# Patient Record
Sex: Female | Born: 1935 | Race: White | Hispanic: No | Smoking: Former smoker
Health system: Southern US, Community
[De-identification: ages and names within clinical notes are randomized; demographics above are authoritative.]

## PROBLEM LIST (undated history)

## (undated) DIAGNOSIS — I2510 Atherosclerotic heart disease of native coronary artery without angina pectoris: Secondary | ICD-10-CM

## (undated) DIAGNOSIS — G902 Horner's syndrome: Secondary | ICD-10-CM

## (undated) DIAGNOSIS — I509 Heart failure, unspecified: Secondary | ICD-10-CM

## (undated) DIAGNOSIS — R296 Repeated falls: Secondary | ICD-10-CM

## (undated) DIAGNOSIS — I6381 Other cerebral infarction due to occlusion or stenosis of small artery: Secondary | ICD-10-CM

## (undated) DIAGNOSIS — I471 Supraventricular tachycardia, unspecified: Secondary | ICD-10-CM

## (undated) DIAGNOSIS — I10 Essential (primary) hypertension: Secondary | ICD-10-CM

## (undated) DIAGNOSIS — F0390 Unspecified dementia without behavioral disturbance: Secondary | ICD-10-CM

## (undated) DIAGNOSIS — R4701 Aphasia: Secondary | ICD-10-CM

## (undated) DIAGNOSIS — E039 Hypothyroidism, unspecified: Secondary | ICD-10-CM

## (undated) HISTORY — DX: Hypothyroidism, unspecified: E03.9

## (undated) HISTORY — DX: Aphasia: R47.01

## (undated) HISTORY — DX: Supraventricular tachycardia: I47.1

## (undated) HISTORY — DX: Heart failure, unspecified: I50.9

## (undated) HISTORY — DX: Repeated falls: R29.6

## (undated) HISTORY — DX: Horner's syndrome: G90.2

## (undated) HISTORY — DX: Other cerebral infarction due to occlusion or stenosis of small artery: I63.81

## (undated) HISTORY — DX: Essential (primary) hypertension: I10

## (undated) HISTORY — DX: Supraventricular tachycardia, unspecified: I47.10

## (undated) HISTORY — DX: Atherosclerotic heart disease of native coronary artery without angina pectoris: I25.10

---

## 1999-07-04 ENCOUNTER — Other Ambulatory Visit: Admission: RE | Admit: 1999-07-04 | Discharge: 1999-07-04 | Payer: Self-pay | Admitting: Internal Medicine

## 2003-06-03 ENCOUNTER — Emergency Department (HOSPITAL_COMMUNITY): Admission: EM | Admit: 2003-06-03 | Discharge: 2003-06-03 | Payer: Self-pay | Admitting: Emergency Medicine

## 2004-01-04 ENCOUNTER — Ambulatory Visit: Payer: Self-pay | Admitting: Internal Medicine

## 2004-01-10 ENCOUNTER — Ambulatory Visit: Payer: Self-pay | Admitting: Internal Medicine

## 2004-03-06 ENCOUNTER — Ambulatory Visit: Payer: Self-pay | Admitting: Internal Medicine

## 2004-04-04 ENCOUNTER — Ambulatory Visit: Payer: Self-pay | Admitting: Internal Medicine

## 2004-07-04 ENCOUNTER — Ambulatory Visit: Payer: Self-pay | Admitting: Internal Medicine

## 2004-10-03 ENCOUNTER — Ambulatory Visit: Payer: Self-pay | Admitting: Internal Medicine

## 2004-12-13 ENCOUNTER — Ambulatory Visit: Payer: Self-pay | Admitting: Internal Medicine

## 2005-02-02 ENCOUNTER — Ambulatory Visit: Payer: Self-pay | Admitting: Internal Medicine

## 2005-03-16 ENCOUNTER — Inpatient Hospital Stay (HOSPITAL_COMMUNITY): Admission: AD | Admit: 2005-03-16 | Discharge: 2005-03-16 | Payer: Self-pay | Admitting: Obstetrics & Gynecology

## 2005-04-04 ENCOUNTER — Emergency Department (HOSPITAL_COMMUNITY): Admission: EM | Admit: 2005-04-04 | Discharge: 2005-04-04 | Payer: Self-pay | Admitting: Family Medicine

## 2005-06-06 ENCOUNTER — Ambulatory Visit: Payer: Self-pay | Admitting: Internal Medicine

## 2005-10-09 ENCOUNTER — Ambulatory Visit: Payer: Self-pay | Admitting: Internal Medicine

## 2005-12-13 ENCOUNTER — Ambulatory Visit: Payer: Self-pay | Admitting: Internal Medicine

## 2006-01-08 ENCOUNTER — Ambulatory Visit: Payer: Self-pay | Admitting: Internal Medicine

## 2006-03-05 ENCOUNTER — Ambulatory Visit: Payer: Self-pay | Admitting: Internal Medicine

## 2006-03-05 LAB — CONVERTED CEMR LAB
ALT: 19 units/L (ref 0–40)
Albumin: 3.4 g/dL — ABNORMAL LOW (ref 3.5–5.2)
CO2: 30 meq/L (ref 19–32)
Calcium: 9.9 mg/dL (ref 8.4–10.5)
Cholesterol: 181 mg/dL (ref 0–200)
Creatinine, Ser: 1.4 mg/dL — ABNORMAL HIGH (ref 0.4–1.2)
GFR calc non Af Amer: 38 mL/min
Glomerular Filtration Rate, Af Am: 46 mL/min/{1.73_m2}
HDL: 54.4 mg/dL (ref >39.0)
Sodium: 139 meq/L (ref 135–145)

## 2006-03-12 ENCOUNTER — Ambulatory Visit: Payer: Self-pay | Admitting: Internal Medicine

## 2006-05-08 ENCOUNTER — Ambulatory Visit: Payer: Self-pay | Admitting: Internal Medicine

## 2006-06-18 ENCOUNTER — Ambulatory Visit: Payer: Self-pay | Admitting: Internal Medicine

## 2006-06-18 LAB — CONVERTED CEMR LAB
Calcium: 9.4 mg/dL (ref 8.4–10.5)
Chloride: 96 meq/L (ref 96–112)
Creatinine, Ser: 1.2 mg/dL (ref 0.4–1.2)
Glucose, Bld: 107 mg/dL — ABNORMAL HIGH (ref 70–99)

## 2006-07-25 DIAGNOSIS — M810 Age-related osteoporosis without current pathological fracture: Secondary | ICD-10-CM | POA: Insufficient documentation

## 2006-07-25 DIAGNOSIS — Z8679 Personal history of other diseases of the circulatory system: Secondary | ICD-10-CM | POA: Insufficient documentation

## 2006-07-25 DIAGNOSIS — I10 Essential (primary) hypertension: Secondary | ICD-10-CM | POA: Insufficient documentation

## 2006-07-29 ENCOUNTER — Ambulatory Visit: Payer: Self-pay | Admitting: Internal Medicine

## 2006-08-28 ENCOUNTER — Ambulatory Visit: Payer: Self-pay | Admitting: Internal Medicine

## 2006-09-24 ENCOUNTER — Encounter: Admission: RE | Admit: 2006-09-24 | Discharge: 2006-09-24 | Payer: Self-pay | Admitting: Internal Medicine

## 2006-09-30 ENCOUNTER — Encounter: Admission: RE | Admit: 2006-09-30 | Discharge: 2006-09-30 | Payer: Self-pay | Admitting: Internal Medicine

## 2006-12-30 ENCOUNTER — Ambulatory Visit: Payer: Self-pay | Admitting: Internal Medicine

## 2007-03-18 ENCOUNTER — Telehealth: Payer: Self-pay | Admitting: *Deleted

## 2007-04-02 ENCOUNTER — Ambulatory Visit: Payer: Self-pay | Admitting: Internal Medicine

## 2007-04-08 ENCOUNTER — Encounter: Admission: RE | Admit: 2007-04-08 | Discharge: 2007-04-08 | Payer: Self-pay | Admitting: Internal Medicine

## 2007-06-04 ENCOUNTER — Ambulatory Visit: Payer: Self-pay | Admitting: Internal Medicine

## 2007-06-04 DIAGNOSIS — T887XXA Unspecified adverse effect of drug or medicament, initial encounter: Secondary | ICD-10-CM | POA: Insufficient documentation

## 2007-06-04 LAB — CONVERTED CEMR LAB
AST: 25 units/L (ref 0–37)
Albumin: 3.3 g/dL — ABNORMAL LOW (ref 3.5–5.2)
Alkaline Phosphatase: 53 units/L (ref 39–117)
BUN: 24 mg/dL — ABNORMAL HIGH (ref 6–23)
Basophils Relative: 0.5 % (ref 0.0–1.0)
Bilirubin, Direct: 0.2 mg/dL (ref 0.0–0.3)
CO2: 30 meq/L (ref 19–32)
Chloride: 106 meq/L (ref 96–112)
Creatinine, Ser: 1.4 mg/dL — ABNORMAL HIGH (ref 0.4–1.2)
Eosinophils Absolute: 0.3 10*3/uL (ref 0.0–0.7)
Eosinophils Relative: 3 % (ref 0.0–5.0)
Glucose, Bld: 113 mg/dL — ABNORMAL HIGH (ref 70–99)
HCT: 35.7 % — ABNORMAL LOW (ref 36.0–46.0)
Lymphocytes Relative: 20.5 % (ref 12.0–46.0)
Monocytes Absolute: 1.1 10*3/uL — ABNORMAL HIGH (ref 0.1–1.0)
Monocytes Relative: 11.1 % (ref 3.0–12.0)
Neutrophils Relative %: 64.9 % (ref 43.0–77.0)
RBC: 3.63 M/uL — ABNORMAL LOW (ref 3.87–5.11)
RDW: 14 % (ref 11.5–14.6)

## 2007-06-05 ENCOUNTER — Encounter: Payer: Self-pay | Admitting: Internal Medicine

## 2007-09-02 ENCOUNTER — Ambulatory Visit: Payer: Self-pay | Admitting: Internal Medicine

## 2007-09-02 DIAGNOSIS — K5909 Other constipation: Secondary | ICD-10-CM | POA: Insufficient documentation

## 2007-09-02 DIAGNOSIS — N6009 Solitary cyst of unspecified breast: Secondary | ICD-10-CM | POA: Insufficient documentation

## 2007-10-03 ENCOUNTER — Encounter: Admission: RE | Admit: 2007-10-03 | Discharge: 2007-10-03 | Payer: Self-pay | Admitting: Internal Medicine

## 2007-11-03 ENCOUNTER — Inpatient Hospital Stay (HOSPITAL_COMMUNITY): Admission: EM | Admit: 2007-11-03 | Discharge: 2007-11-05 | Payer: Self-pay | Admitting: Emergency Medicine

## 2007-11-03 ENCOUNTER — Ambulatory Visit: Payer: Self-pay | Admitting: Cardiology

## 2007-11-03 ENCOUNTER — Ambulatory Visit: Payer: Self-pay | Admitting: Internal Medicine

## 2007-11-04 ENCOUNTER — Ambulatory Visit: Payer: Self-pay | Admitting: Surgery

## 2007-11-04 ENCOUNTER — Encounter: Payer: Self-pay | Admitting: Internal Medicine

## 2007-12-05 ENCOUNTER — Ambulatory Visit: Payer: Self-pay | Admitting: Internal Medicine

## 2007-12-05 DIAGNOSIS — E039 Hypothyroidism, unspecified: Secondary | ICD-10-CM

## 2007-12-05 LAB — CONVERTED CEMR LAB: TSH: 0.06 microintl units/mL — ABNORMAL LOW (ref 0.35–5.50)

## 2008-03-05 ENCOUNTER — Ambulatory Visit: Payer: Self-pay | Admitting: Internal Medicine

## 2008-04-06 ENCOUNTER — Encounter: Admission: RE | Admit: 2008-04-06 | Discharge: 2008-04-06 | Payer: Self-pay | Admitting: Internal Medicine

## 2008-04-27 ENCOUNTER — Encounter: Payer: Self-pay | Admitting: Cardiology

## 2008-04-27 ENCOUNTER — Ambulatory Visit: Payer: Self-pay | Admitting: Cardiology

## 2008-04-27 ENCOUNTER — Inpatient Hospital Stay (HOSPITAL_COMMUNITY): Admission: EM | Admit: 2008-04-27 | Discharge: 2008-05-01 | Payer: Self-pay | Admitting: Emergency Medicine

## 2008-04-28 ENCOUNTER — Encounter (INDEPENDENT_AMBULATORY_CARE_PROVIDER_SITE_OTHER): Payer: Self-pay | Admitting: Emergency Medicine

## 2008-04-28 ENCOUNTER — Ambulatory Visit: Payer: Self-pay | Admitting: Surgery

## 2008-05-04 ENCOUNTER — Ambulatory Visit: Payer: Self-pay | Admitting: Cardiology

## 2008-05-12 ENCOUNTER — Ambulatory Visit: Payer: Self-pay | Admitting: Internal Medicine

## 2008-05-12 DIAGNOSIS — I2510 Atherosclerotic heart disease of native coronary artery without angina pectoris: Secondary | ICD-10-CM

## 2008-05-20 ENCOUNTER — Telehealth: Payer: Self-pay | Admitting: Internal Medicine

## 2008-05-26 ENCOUNTER — Encounter: Payer: Self-pay | Admitting: Cardiology

## 2008-05-26 ENCOUNTER — Ambulatory Visit: Payer: Self-pay | Admitting: Cardiology

## 2008-06-07 ENCOUNTER — Telehealth: Payer: Self-pay | Admitting: Cardiology

## 2008-06-09 ENCOUNTER — Ambulatory Visit: Payer: Self-pay | Admitting: Internal Medicine

## 2008-06-09 DIAGNOSIS — I872 Venous insufficiency (chronic) (peripheral): Secondary | ICD-10-CM | POA: Insufficient documentation

## 2008-06-22 ENCOUNTER — Encounter: Payer: Self-pay | Admitting: Cardiology

## 2008-06-22 ENCOUNTER — Ambulatory Visit: Payer: Self-pay

## 2008-06-25 ENCOUNTER — Telehealth: Payer: Self-pay | Admitting: Cardiology

## 2008-06-25 ENCOUNTER — Inpatient Hospital Stay (HOSPITAL_COMMUNITY): Admission: EM | Admit: 2008-06-25 | Discharge: 2008-06-30 | Payer: Self-pay | Admitting: Emergency Medicine

## 2008-07-02 ENCOUNTER — Telehealth: Payer: Self-pay | Admitting: Internal Medicine

## 2008-07-05 ENCOUNTER — Encounter: Payer: Self-pay | Admitting: Internal Medicine

## 2008-07-14 ENCOUNTER — Telehealth: Payer: Self-pay | Admitting: Cardiology

## 2008-07-14 ENCOUNTER — Telehealth: Payer: Self-pay | Admitting: Internal Medicine

## 2008-08-03 ENCOUNTER — Encounter: Payer: Self-pay | Admitting: Internal Medicine

## 2008-08-11 ENCOUNTER — Ambulatory Visit: Payer: Self-pay | Admitting: Internal Medicine

## 2008-08-11 DIAGNOSIS — L500 Allergic urticaria: Secondary | ICD-10-CM

## 2008-08-20 ENCOUNTER — Telehealth (INDEPENDENT_AMBULATORY_CARE_PROVIDER_SITE_OTHER): Payer: Self-pay | Admitting: *Deleted

## 2008-08-25 ENCOUNTER — Telehealth: Payer: Self-pay | Admitting: *Deleted

## 2008-08-26 ENCOUNTER — Encounter: Payer: Self-pay | Admitting: Internal Medicine

## 2008-09-17 ENCOUNTER — Ambulatory Visit: Payer: Self-pay | Admitting: Cardiology

## 2008-09-17 DIAGNOSIS — I5032 Chronic diastolic (congestive) heart failure: Secondary | ICD-10-CM

## 2008-09-24 ENCOUNTER — Encounter: Payer: Self-pay | Admitting: Internal Medicine

## 2008-09-28 ENCOUNTER — Ambulatory Visit: Payer: Self-pay | Admitting: Cardiology

## 2008-09-28 ENCOUNTER — Telehealth (INDEPENDENT_AMBULATORY_CARE_PROVIDER_SITE_OTHER): Payer: Self-pay | Admitting: *Deleted

## 2008-09-28 LAB — CONVERTED CEMR LAB
BUN: 27 mg/dL — ABNORMAL HIGH (ref 6–23)
CO2: 30 meq/L (ref 19–32)
Creatinine, Ser: 1.3 mg/dL — ABNORMAL HIGH (ref 0.4–1.2)
GFR calc non Af Amer: 41.26 mL/min (ref >60)

## 2008-10-04 ENCOUNTER — Encounter: Admission: RE | Admit: 2008-10-04 | Discharge: 2008-10-04 | Payer: Self-pay | Admitting: Internal Medicine

## 2008-10-13 ENCOUNTER — Ambulatory Visit: Payer: Self-pay | Admitting: Internal Medicine

## 2008-11-05 ENCOUNTER — Ambulatory Visit: Payer: Self-pay | Admitting: Cardiology

## 2008-11-05 DIAGNOSIS — E785 Hyperlipidemia, unspecified: Secondary | ICD-10-CM | POA: Insufficient documentation

## 2008-11-08 ENCOUNTER — Telehealth: Payer: Self-pay | Admitting: Cardiology

## 2008-11-08 ENCOUNTER — Ambulatory Visit: Payer: Self-pay | Admitting: Cardiology

## 2008-11-16 LAB — CONVERTED CEMR LAB
Albumin: 3.7 g/dL (ref 3.5–5.2)
Alkaline Phosphatase: 70 units/L (ref 39–117)
HDL: 74.4 mg/dL (ref >39.00)
LDL Cholesterol: 43 mg/dL (ref 0–99)
Total CHOL/HDL Ratio: 2

## 2008-11-23 ENCOUNTER — Encounter: Payer: Self-pay | Admitting: Internal Medicine

## 2008-12-20 ENCOUNTER — Encounter: Payer: Self-pay | Admitting: Cardiology

## 2008-12-28 ENCOUNTER — Telehealth: Payer: Self-pay | Admitting: Cardiology

## 2009-01-17 ENCOUNTER — Ambulatory Visit: Payer: Self-pay | Admitting: Internal Medicine

## 2009-01-26 ENCOUNTER — Telehealth (INDEPENDENT_AMBULATORY_CARE_PROVIDER_SITE_OTHER): Payer: Self-pay | Admitting: *Deleted

## 2009-01-26 ENCOUNTER — Telehealth: Payer: Self-pay | Admitting: Cardiology

## 2009-01-27 ENCOUNTER — Telehealth: Payer: Self-pay | Admitting: Cardiology

## 2009-02-11 ENCOUNTER — Ambulatory Visit: Payer: Self-pay

## 2009-02-11 LAB — CONVERTED CEMR LAB
BUN: 24 mg/dL — ABNORMAL HIGH (ref 6–23)
CO2: 30 meq/L (ref 19–32)
Calcium: 9.5 mg/dL (ref 8.4–10.5)
Chloride: 103 meq/L (ref 96–112)
GFR calc non Af Amer: 37.85 mL/min (ref >60)
Sodium: 139 meq/L (ref 135–145)

## 2009-02-16 ENCOUNTER — Telehealth: Payer: Self-pay | Admitting: Cardiology

## 2009-03-02 ENCOUNTER — Telehealth (INDEPENDENT_AMBULATORY_CARE_PROVIDER_SITE_OTHER): Payer: Self-pay | Admitting: *Deleted

## 2009-03-22 ENCOUNTER — Ambulatory Visit: Payer: Self-pay | Admitting: Internal Medicine

## 2009-03-22 LAB — CONVERTED CEMR LAB
Cholesterol: 118 mg/dL (ref 0–200)
Free T4: 1.2 ng/dL (ref 0.6–1.6)
T3, Free: 2.5 pg/mL (ref 2.3–4.2)

## 2009-05-17 ENCOUNTER — Ambulatory Visit: Payer: Self-pay | Admitting: Cardiology

## 2009-06-08 ENCOUNTER — Ambulatory Visit: Payer: Self-pay | Admitting: Cardiovascular Disease

## 2009-06-08 ENCOUNTER — Encounter: Payer: Self-pay | Admitting: Cardiology

## 2009-06-08 ENCOUNTER — Ambulatory Visit: Payer: Self-pay

## 2009-06-08 ENCOUNTER — Ambulatory Visit (HOSPITAL_COMMUNITY): Admission: RE | Admit: 2009-06-08 | Discharge: 2009-06-08 | Payer: Self-pay | Admitting: Cardiovascular Disease

## 2009-07-12 ENCOUNTER — Ambulatory Visit: Payer: Self-pay | Admitting: Internal Medicine

## 2009-10-12 ENCOUNTER — Encounter: Admission: RE | Admit: 2009-10-12 | Discharge: 2009-10-12 | Payer: Self-pay | Admitting: Internal Medicine

## 2009-11-09 ENCOUNTER — Ambulatory Visit: Payer: Self-pay | Admitting: Cardiology

## 2009-11-15 ENCOUNTER — Telehealth (INDEPENDENT_AMBULATORY_CARE_PROVIDER_SITE_OTHER): Payer: Self-pay | Admitting: *Deleted

## 2009-11-23 ENCOUNTER — Telehealth (INDEPENDENT_AMBULATORY_CARE_PROVIDER_SITE_OTHER): Payer: Self-pay | Admitting: *Deleted

## 2009-11-24 ENCOUNTER — Telehealth (INDEPENDENT_AMBULATORY_CARE_PROVIDER_SITE_OTHER): Payer: Self-pay | Admitting: *Deleted

## 2009-11-28 ENCOUNTER — Encounter: Payer: Self-pay | Admitting: Internal Medicine

## 2010-01-10 ENCOUNTER — Ambulatory Visit: Payer: Self-pay | Admitting: Internal Medicine

## 2010-03-26 LAB — CONVERTED CEMR LAB
CO2: 29 meq/L (ref 19–32)
Creatinine, Ser: 1.3 mg/dL — ABNORMAL HIGH (ref 0.4–1.2)
Glucose, Bld: 89 mg/dL (ref 70–99)
Sodium: 142 meq/L (ref 135–145)

## 2010-03-30 NOTE — Progress Notes (Signed)
     Follow-up for Phone Call       Follow-up by: KM

## 2010-03-30 NOTE — Progress Notes (Signed)
  Pt's Son dropped off [facility] papers to be completed, also completed Packet for [facility] sent all to [facility]. [facility] [place]  November 15, 2009 8:56 AM

## 2010-03-30 NOTE — Assessment & Plan Note (Signed)
Summary: 6 month rov/sl   Primary [name]:  Kathryn Glaze MD   History of Present Illness: 77 yo with h/o CAD and TIAs vs atypical migraines presents for followup.  She is now living at home by herself though her son lives around the corner.  She is able to walk around her house and to the mailbox without exertional dyspnea.  She uses a recumbent bike in her house for exercise.  She has not had any chest pain.  Last echo showed preserved LV systolic function.  She has had no recent neurologic episodes (TIA vs atypical migraine).  No orthopnea.  Weight is down 4 lbs.  No lightheadedness with standing.    ECG: NSR, normal  Labs (8/10): K 4.2, creatinine 1.3 Labs (1/11): LDL 36, HDL 63, TSH normal Labs (9/11): K 4.6, creatinine 1.3  Current Medications (verified): 1)  Os-Cal 500 + D 500-200 Mg-Unit Tabs (Calcium Carbonate-Vitamin D) .... Once Daily 2)  Clonidine Hcl 0.1 Mg Tabs (Clonidine Hcl) .[name].. 1 By Mouth Three Times A Day 3)  Vitamin D 16109 Unit  Caps (Ergocalciferol) .... One By Mouth Weekly 4)  Lactulose 10 Gm/39ml  Soln (Lactulose) .[name].. 15cc By Mouth Daily For Bowels, May Increase To 30 Cc If Constipated 5)  Levothyroxine Sodium 25 Mcg Tabs (Levothyroxine Sodium) .... One By Mouth Daily 1/2 Hour Before Breakfast On Empty Stomach 6)  Ensure Nutra Shake Hi-Cal  Liqd (Nutritional Supplements) .... Once Daily 7)  Fluoridex Sensitivity Relief 1.1-5 % Pste (Sod Fluoride-Potassium Nitrate) .... Use  As Directed 8)  Plavix 75 Mg Tabs (Clopidogrel Bisulfate) .[name].. 1 Once Daily 9)  Ascriptin 325 Mg Tabs (Aspirin Buf(Alhyd-Mghyd-Cacar)) .[name].. 1 Once Daily 10)  Coreg 12.5 Mg Tabs (Carvedilol) .[name].. 1 Two Times A Day 11)  Simvastatin 40 Mg Tabs (Simvastatin) .[name].. 1 By Mouth Daily Before Bed 12)  Amlodipine Besylate 10 Mg Tabs (Amlodipine Besylate) .... Once Daily 13)  Nitrostat 0.4 Mg Subl (Nitroglycerin) .... As Needed 14)  Diovan 160 Mg Tabs (Valsartan) .... One Tablet Two Times A Day 15)   Keppra 250 Mg Tabs (Levetiracetam) .[name].. 1 Two Times A Day 16)  Lasix 20 Mg Tabs (Furosemide) .[name].. 1 Every Other Day  Allergies (verified): 1)  ! * Clonidine Patch Glue  Past History:  Past Medical History: 1.  HTN 2.  CAD:  NSTEMI 3/10.  LHC showed 80% mLAD, 90% mCFX.  She had PCI with Endeavor 2.5 x 12 to LAD and Endeavor 3.0 x 12 to CFX.   3.  Diastolic CHF:  Echo (3/10) EF 60-45% with periapical akinesis.  Mild LVH.  Mild MR.  Pseudonormal diastolic function.  Echo (4/10): EF 65% with mild focal basal septal hypertrophy.  Pseudonormal diastolic function.  Normal wall motion.  Moderate biatrial enlargement.  PASP 61 mmHg.  Echo (4/11): EF 55%, inferobasal hypokinesis, mild MR, PA systolic pressure 44 mmHg.  4.  Frequent falls 5.  Lacunar stroke on CT 6.  Expressive aphasia episodes:  More likely atypical migraine than TIA.  Carotid dopplers (8/'09) with 40-60% LICA stenosis.  7.  Left Horners syndrome.  8.  Transient SVT 9.  Hypothyroidism.   Family History: Reviewed history from 05/26/2008 and no changes required. mother passed away from  HTN and age father died from COPD  Social History: Reviewed history from 05/17/2009 and no changes required. 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  Lives alone but son close by.  Alcohol use-no Drug use-no Regular exercise-no  Review of Systems       All systems reviewed and negative except as per HPI.   Vital Signs:  Patient profile:   77 year old female Height:      59 inches Weight:      145 pounds BMI:     29.39 Pulse rate:   70 / minute Pulse rhythm:   regular BP sitting:   126 / 62  (left arm) Cuff size:   regular  Vitals Entered By: Judithe Modest CMA (November 09, 2009 3:14 PM)  Physical Exam  General:  Elderly female, no apparent distress.  Neck:  Neck supple, no JVD. No masses, thyromegaly or abnormal cervical nodes. Lungs:  Clear bilaterally to auscultation and percussion. Heart:   Non-displaced PMI, chest non-tender; regular rate and rhythm, S1, S2 without rubs or gallops. 2/6 early systolic ejection murmur RUSB.  Carotid upstroke normal, no bruit.  Trace ankle edema.  Abdomen:  Bowel sounds positive; abdomen soft and non-tender without masses, organomegaly, or hernias noted. No hepatosplenomegaly. Extremities:  No clubbing or cyanosis. Neurologic:  Alert and oriented x 3. Psych:  Normal affect.   Impression & Recommendations:  Problem # 1:  DIASTOLIC HEART FAILURE, CHRONIC (ICD-428.32) Euvolemic, minimal dyspnea.  Continue current Lasix regimen.  EF normal on last echo with mild pulmonary hypertension suggesting LV diastolic dysfunction.  Creatinine stable.   Problem # 2:  CORONARY ATHEROSCLEROSIS NATIVE CORONARY ARTERY (ICD-414.01) No ischemic symptoms.  She has been on Plavix for over 1.5 years after PCI.  Given her age and fall risk, I think that we should stop Plavix at this point.  She can decrease aspirin to 162 mg daily.  Continue statin, Coreg, ARB.   Problem # 3:  HYPERLIPIDEMIA-MIXED (ICD-272.4) Will be getting lipids soon with Dr. Lovell Sheehan.   Other Orders: TLB-BMP (Basic Metabolic Panel-BMET) (80048-METABOL)  Patient Instructions: 1)  Your physician has recommended you make the following change in your medication:  2)  Stop Plavix. 3)  Decrease Apsirin to 162mg  daily--this will be two 81mg  tablets daily--this should be buffered or coated. 4)  Lab today--BMP 414.01 428.32 5)  Your physician wants you to follow-up in: 6 months with Dr Shirlee Latch.  You will receive a reminder letter in the mail two months in advance. If you don't receive a letter, please call our office to schedule the follow-up appointment.

## 2010-03-30 NOTE — Letter (Signed)
Summary: [facility]  [facility]   Imported By: Maryln Gottron 12/05/2009 12:41:36  _____________________________________________________________________  External Attachment:    Type:   Image     Comment:   External Document

## 2010-03-30 NOTE — Assessment & Plan Note (Signed)
Summary: 6 MONTH ROV/SL   Visit Type:  Follow-up Primary Provider:  Stacie Glaze MD   History of Present Illness: 77 yo with h/o CAD and TIAs vs atypical migraines presents for followup.  She is now living at home by herself though her son lives around the corner.  She is able to walk around her house and to the mailbox without exertional dyspnea.  She uses a recumbent bike in her house for exercise.  She has not had any chest pain.  Last echo showed preserved LV systolic function.  She has had no recent neurologic episodes (TIA vs atypical migraine).  No orthopnea.  Weight is stable.   ECG: NSR, T wave inversion in V6  Labs (8/10): K 4.2, creatinine 1.3 Labs (1/11): LDL 36, HDL 63, TSH normal  Current Medications (verified): 1)  Os-Cal 500 + D 500-200 Mg-Unit Tabs (Calcium Carbonate-Vitamin D) .... Once Daily 2)  Clonidine Hcl 0.1 Mg Tabs (Clonidine Hcl) .[name].. 1 By Mouth Three Times A Day 3)  Vitamin D 16109 Unit  Caps (Ergocalciferol) .... One By Mouth Weekly 4)  Lactulose 10 Gm/35ml  Soln (Lactulose) .[name].. 15cc By Mouth Daily For Bowels, May Increase To 30 Cc If Constipated 5)  Levothyroxine Sodium 25 Mcg Tabs (Levothyroxine Sodium) .... One By Mouth Daily 1/2 Hour Before Breakfast On Empty Stomach 6)  Ensure Nutra Shake Hi-Cal  Liqd (Nutritional Supplements) .... Once Daily 7)  Fluoridex Sensitivity Relief 1.1-5 % Pste (Sod Fluoride-Potassium Nitrate) .... Use  As Directed 8)  Plavix 75 Mg Tabs (Clopidogrel Bisulfate) .[name].. 1 Once Daily 9)  Ascriptin 325 Mg Tabs (Aspirin Buf(Alhyd-Mghyd-Cacar)) .[name].. 1 Once Daily 10)  Coreg 12.5 Mg Tabs (Carvedilol) .[name].. 1 Two Times A Day 11)  Simvastatin 40 Mg Tabs (Simvastatin) .[name].. 1 By Mouth Daily Before Bed 12)  Amlodipine Besylate 10 Mg Tabs (Amlodipine Besylate) .... Once Daily 13)  Nitrostat 0.4 Mg Subl (Nitroglycerin) .... As Needed 14)  Diovan 160 Mg Tabs (Valsartan) .... One Tablet Two Times A Day 15)  Keppra 250 Mg Tabs (Levetiracetam)  .[name].. 1 Two Times A Day 16)  Lasix 20 Mg Tabs (Furosemide) .[name].. 1 Every Other Day  Allergies: 1)  ! * Clonidine Patch Glue  Past History:  Past Medical History: Reviewed history from 09/17/2008 and no changes required. 1.  HTN 2.  CAD:  NSTEMI 3/10.  LHC showed 80% mLAD, 90% mCFX.  She had PCI with Endeavor 2.5 x 12 to LAD and Endeavor 3.0 x 12 to CFX.   3.  Diastolic CHF:  Echo (3/10) EF 60-45% with periapical akinesis.  Mild LVH.  Mild MR.  Pseudonormal diastolic function.  Echo (4/10): EF 65% with mild focal basal septal hypertrophy.  Pseudonormal diastolic function.  Normal wall motion.  Moderate biatrial enlargement.  PASP 61 mmHg.  4.  Frequent falls 5.  Lacunar stroke on CT 6.  Expressive aphasia episodes:  More likely atypical migraine than TIA.  Carotid dopplers (8/'09) with 40-60% LICA stenosis.  7.  Left Horners syndrome.  8.  Transient SVT 9.  Hypothyroidism.   Family History: Reviewed history from 05/26/2008 and no changes required. 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  Lives alone but son close by.  Alcohol use-no Drug use-no Regular exercise-no  Review of Systems       All systems reviewed and negative except as per HPI.  Vital Signs:  Patient profile:   77 year old female Height:      59 inches Weight:      149 pounds Pulse rate:   66 / minute BP sitting:   122 / 74  (left arm)  Vitals Entered By: Laurance Flatten CMA (May 17, 2009 4:08 PM)  Physical Exam  General:  Elderly female, no apparent distress.  Neck:  Neck supple, no JVD. No masses, thyromegaly or abnormal cervical nodes. Lungs:  Clear bilaterally to auscultation and percussion. Heart:  Non-displaced PMI, chest non-tender; regular rate and rhythm, S1, S2 without rubs or gallops. 2/6 early systolic ejection murmur RUSB.  Carotid upstroke normal, no bruit.  Trace ankle edema.  Abdomen:  Bowel  sounds positive; abdomen soft and non-tender without masses, organomegaly, or hernias noted. No hepatosplenomegaly. Extremities:  No clubbing or cyanosis. Neurologic:  Alert and oriented x 3. Psych:  Normal affect.   Impression & Recommendations:  Problem # 1:  DIASTOLIC HEART FAILURE, CHRONIC (ICD-428.32) Most recent echo showed preserved LV systolic function (4/10).  Prior echo showed systolic dysfunction.  She is doing well symptomatically and appears euvolemic.  I will repeat echo in 4/11 to make sure EF remains preserved and to re-assess pulmonary artery pressure now that systemic BP is under good control and volume overload seems to be resolved.  She will continue Lasix 20 mg qod.    Problem # 2:  CORONARY ATHEROSCLEROSIS NATIVE CORONARY ARTERY (ICD-414.01) No ischemic symptoms. Continue ASA, Plavix, statin, beta blocker, ARB.   Problem # 3:  HYPERTENSION (ICD-401.9) BP is under good control with current regimen.   Other Orders: EKG w/ Interpretation (93000) Echocardiogram (Echo)  Patient Instructions: 1)  Your physician has requested that you have an echocardiogram.  Echocardiography is a painless test that uses sound waves to create images of your heart. It provides your doctor with information about the size and shape of your heart and how well your heart's chambers and valves are working.  This procedure takes approximately one hour. There are no restrictions for this procedure.  APRIL 2011 2)  Your physician wants you to follow-up in: 6 months with Dr Shirlee Latch.   You will receive a reminder letter in the mail two months in advance. If you don't receive a letter, please call our office to schedule the follow-up appointment.

## 2010-03-30 NOTE — Progress Notes (Signed)
Summary: [facility]  [facility]   Imported By: Roderic Ovens 03/02/2009 11:28:16  _____________________________________________________________________  External Attachment:    Type:   Image     Comment:   External Document

## 2010-03-30 NOTE — Assessment & Plan Note (Signed)
Summary: 6 month rov/njr   Vital Signs:  Patient profile:   77 year old female Height:      59 inches Weight:      144 pounds BMI:     29.19 Temp:     98.2 degrees F oral Pulse rate:   72 / minute Resp:     14 per minute BP sitting:   140 / 80  (left arm)  Vitals Entered By: Willy Eddy, LPN (January 10, 2010 4:15 PM) CC: roa, Hypertension Management Is Patient Diabetic? No   Primary Care [facility]:  Stacie Glaze MD  CC:  roa and Hypertension Management.  History of Present Illness: The pt has not been able to get suppliment that has the calcium and the bone minerals. Has not been walking....  Has not been able to use the cycle due to moving the pessary dicussed 1000 steps a day   Hypertension History:      She denies headache, chest pain, palpitations, dyspnea with exertion, orthopnea, PND, peripheral edema, visual symptoms, neurologic problems, syncope, and side effects from treatment.        Positive major cardiovascular risk factors include female age 43 years old or older, hyperlipidemia, and hypertension.  Negative major cardiovascular risk factors include non-tobacco-user status.        Positive history for target organ damage include ASHD (either angina/prior MI/prior CABG) and cardiac end organ damage (either CHF or LVH).     Preventive Screening-Counseling & Management  Alcohol-Tobacco     Smoking Status: never     Year Quit: 1954     Passive Smoke Exposure: no     Tobacco Counseling: not indicated; no tobacco use  Problems Prior to Update: 1)  Hyperlipidemia-mixed  (ICD-272.4) 2)  Diastolic Heart Failure, Chronic  (ICD-428.32) 3)  Coronary Atherosclerosis Native Coronary Artery  (ICD-414.01) 4)  Ischemic Cardiomyopathy  (ICD-414.8) 5)  Allergic Urticaria  (ICD-708.0) 6)  Unspecified Venous Insufficiency  (ICD-459.81) 7)  Unspecified Hypothyroidism  (ICD-244.9) 8)  Breast Cyst  (ICD-610.0) 9)  Breast Cyst  (ICD-610.0) 10)  Constipation, Drug  Induced  (ICD-564.09) 11)  Uns Advrs Eff Uns Rx Medicinal&biological Sbstnc  (ICD-995.20) 12)  Cough  (ICD-786.2) 13)  Cerebrovascular Accident, Hx of  (ICD-V12.50) 14)  Osteoporosis  (ICD-733.00) 15)  Hypertension  (ICD-401.9)  Current Problems (verified): 1)  Hyperlipidemia-mixed  (ICD-272.4) 2)  Diastolic Heart Failure, Chronic  (ICD-428.32) 3)  Coronary Atherosclerosis Native Coronary Artery  (ICD-414.01) 4)  Ischemic Cardiomyopathy  (ICD-414.8) 5)  Allergic Urticaria  (ICD-708.0) 6)  Unspecified Venous Insufficiency  (ICD-459.81) 7)  Unspecified Hypothyroidism  (ICD-244.9) 8)  Breast Cyst  (ICD-610.0) 9)  Breast Cyst  (ICD-610.0) 10)  Constipation, Drug Induced  (ICD-564.09) 11)  Uns Advrs Eff Uns Rx Medicinal&biological Sbstnc  (ICD-995.20) 12)  Cough  (ICD-786.2) 13)  Cerebrovascular Accident, Hx of  (ICD-V12.50) 14)  Osteoporosis  (ICD-733.00) 15)  Hypertension  (ICD-401.9)  Medications Prior to Update: 1)  Os-Cal 500 + D 500-200 Mg-Unit Tabs (Calcium Carbonate-Vitamin D) .... Once Daily 2)  Clonidine Hcl 0.1 Mg Tabs (Clonidine Hcl) .[name].. 1 By Mouth Three Times A Day 3)  Vitamin D 30865 Unit  Caps (Ergocalciferol) .... One By Mouth Weekly 4)  Lactulose 10 Gm/80ml  Soln (Lactulose) .[name].. 15cc By Mouth Daily For Bowels, May Increase To 30 Cc If Constipated 5)  Levothyroxine Sodium 25 Mcg Tabs (Levothyroxine Sodium) .... One By Mouth Daily 1/2 Hour Before Breakfast On Empty Stomach 6)  Ensure Nutra Shake  Hi-Cal  Liqd (Nutritional Supplements) .... Once Daily 7)  Fluoridex Sensitivity Relief 1.1-5 % Pste (Sod Fluoride-Potassium Nitrate) .... Use  As Directed 8)  Aspirin 81 Mg Tbec (Aspirin) .... Two Tablets Daily 9)  Coreg 12.5 Mg Tabs (Carvedilol) .[name].. 1 Two Times A Day 10)  Simvastatin 40 Mg Tabs (Simvastatin) .[name].. 1 By Mouth Daily Before Bed 11)  Amlodipine Besylate 10 Mg Tabs (Amlodipine Besylate) .... Once Daily 12)  Nitrostat 0.4 Mg Subl (Nitroglycerin) .... As Needed 13)   Diovan 160 Mg Tabs (Valsartan) .... One Tablet Two Times A Day 14)  Keppra 250 Mg Tabs (Levetiracetam) .[name].. 1 Two Times A Day 15)  Lasix 20 Mg Tabs (Furosemide) .[name].. 1 Every Other Day  Current Medications (verified): 1)  Os-Cal 500 + D 500-200 Mg-Unit Tabs (Calcium Carbonate-Vitamin D) .... Once Daily 2)  Clonidine Hcl 0.1 Mg Tabs (Clonidine Hcl) .[name].. 1 By Mouth Three Times A Day 3)  Vitamin D 04540 Unit  Caps (Ergocalciferol) .... One By Mouth Weekly 4)  Lactulose 10 Gm/2ml  Soln (Lactulose) .[name].. 15cc By Mouth Daily For Bowels, May Increase To 30 Cc If Constipated 5)  Levothyroxine Sodium 25 Mcg Tabs (Levothyroxine Sodium) .... One By Mouth Daily 1/2 Hour Before Breakfast On Empty Stomach 6)  Fluoridex Sensitivity Relief 1.1-5 % Pste (Sod Fluoride-Potassium Nitrate) .... Use  As Directed 7)  Aspirin 81 Mg Tbec (Aspirin) .... Two Tablets Daily 8)  Coreg 12.5 Mg Tabs (Carvedilol) .[name].. 1 Two Times A Day 9)  Simvastatin 40 Mg Tabs (Simvastatin) .[name].. 1 By Mouth Daily Before Bed 10)  Amlodipine Besylate 10 Mg Tabs (Amlodipine Besylate) .... Once Daily 11)  Nitrostat 0.4 Mg Subl (Nitroglycerin) .... As Needed 12)  Diovan 160 Mg Tabs (Valsartan) .... One Tablet Two Times A Day 13)  Keppra 250 Mg Tabs (Levetiracetam) .[name].. 1 Two Times A Day 14)  Lasix 20 Mg Tabs (Furosemide) .[name].. 1 Every Other Day  Allergies (verified): 1)  ! * Clonidine Patch Glue  Past History:  Family History: Last updated: 06/25/2008 mother passed away from  HTN and age father died from COPD  Social History: Last updated: 05/17/2009 Never Smoked Retired Son Rick involved in her care.  Widowed.  Lives alone but son close by.  Alcohol use-no Drug use-no Regular exercise-no  Risk Factors: Exercise: no (04/02/2007)  Risk Factors: Smoking Status: never (01/10/2010) Passive Smoke Exposure: no (01/10/2010)  Past medical, surgical, family and social histories (including risk factors) reviewed, and no changes noted  (except as noted below).  Past Medical History: Reviewed history from 11/09/2009 and no changes required. 1.  HTN 2.  CAD:  NSTEMI 3/10.  LHC showed 80% mLAD, 90% mCFX.  She had PCI with Endeavor 2.5 x 12 to LAD and Endeavor 3.0 x 12 to CFX.   3.  Diastolic CHF:  Echo (3/10) EF 98-11% with periapical akinesis.  Mild LVH.  Mild MR.  Pseudonormal diastolic function.  Echo (4/10): EF 65% with mild focal basal septal hypertrophy.  Pseudonormal diastolic function.  Normal wall motion.  Moderate biatrial enlargement.  PASP 61 mmHg.  Echo (4/11): EF 55%, inferobasal hypokinesis, mild MR, PA systolic pressure 44 mmHg.  4.  Frequent falls 5.  Lacunar stroke on CT 6.  Expressive aphasia episodes:  More likely atypical migraine than TIA.  Carotid dopplers (8/'09) with 40-60% LICA stenosis.  7.  Left Horners syndrome.  8.  Transient SVT 9.  Hypothyroidism.   Past Surgical History: Reviewed history from 04/02/2007 and no  changes required. no surgeries Denies surgical history  Family History: Reviewed history from 05/26/2008 and no changes required. mother passed away from  HTN and age father died from COPD  Social History: Reviewed history from 05/17/2009 and no changes required. 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  Lives alone but son close by.  Alcohol use-no Drug use-no Regular exercise-no  Review of Systems       The patient complains of syncope, decreased hearing, and peripheral edema.  The patient denies weight loss, chest pain, dyspnea on exertion, headaches, abdominal pain, melena, severe indigestion/heartburn, depression, unusual weight change, abnormal bleeding, and breast masses.         Flu Vaccine Consent Questions     Do you have a history of severe allergic reactions to this vaccine? no    Any prior history of allergic reactions to egg and/or gelatin? no    Do you have a sensitivity to the preservative Thimersol? no    Do you have a past history  of Guillan-Barre Syndrome? no    Do you currently have an acute febrile illness? no    Have you ever had a severe reaction to latex? no    Vaccine information given and explained to patient? yes    Are you currently pregnant? no    Lot Number:[id number]   Exp Date:08/26/2010   Site Given  Left Deltoid IM   Physical Exam  General:  Elderly female, no apparent distress.  Head:  normocephalic and atraumatic Eyes:  pupils equal and pupils round.  pupils reactive to light.   Ears:  R ear normal and L ear normal.   Nose:  no deformity, discharge, inflammation, or lesions Neck:  Neck supple, JVP 8-9 cm. No masses, thyromegaly or abnormal cervical nodes. Lungs:  Clear bilaterally to auscultation and percussion. Heart:  Non-displaced PMI, chest non-tender; regular rate and rhythm, S1, S2 without rubs or gallops. 2/6 early systolic ejection murmur RUSB.  Carotid upstroke normal, no bruit.  1+ edema L>R to 1/4 up lower legs bilaterally.  Abdomen:  Bowel sounds positive,abdomen soft and non-tender without masses, organomegaly or hernias noted. Msk:  joint tenderness, joint swelling, and joint warmth.     Impression & Recommendations:  Problem # 1:  DIASTOLIC HEART FAILURE, CHRONIC (ICD-428.32)  Her updated medication list for this problem includes:    Aspirin 81 Mg Tbec (Aspirin) .[name]..[name] Two tablets daily    Coreg 12.5 Mg Tabs (Carvedilol) .[name]... 1 two times a day    Diovan 160 Mg Tabs (Valsartan) ..... One tablet two times a day    Lasix 20 Mg Tabs (Furosemide) .[name]... 1 every other day  Echocardiogram: Study Conclusions    1. Left ventricle: There was mild focal basal hypertrophy of the      septum. The estimated ejection fraction was 65%. Wall motion was      normal; there were no regional wall motion abnormalities.      Features are consistent with a pseudonormal left ventricular      filling pattern, with concomitant abnormal relaxation and      increased filling pressure (grade 2  diastolic dysfunction).      Doppler parameters are consistent with elevated ventricular      end-diastolic filling pressure.   2. Left atrium: The atrium was moderately dilated.   3. Right atrium: The atrium was moderately dilated.   4. Atrial septum: There was increased thickness of the septum,      consistent  with lipomatous hypertrophy.   5. Pulmonary arteries: Systolic pressure was moderately to severely      increased. PA peak pressure: 61mm Hg (S).  (06/22/2008)  Problem # 2:  HYPERTENSION (ICD-401.9)  Her updated medication list for this problem includes:    Clonidine Hcl 0.1 Mg Tabs (Clonidine hcl) .[name]... 1 by mouth three times a day    Coreg 12.5 Mg Tabs (Carvedilol) .[name]... 1 two times a day    Amlodipine Besylate 10 Mg Tabs (Amlodipine besylate) ..... Once daily    Diovan 160 Mg Tabs (Valsartan) ..... One tablet two times a day    Lasix 20 Mg Tabs (Furosemide) .[name]... 1 every other day  BP today: 140/80 Prior BP: 126/62 (11/09/2009)  Prior 10 Yr Risk Heart Disease: N/A (06/09/2008)  Labs Reviewed: K+: 4.6 (11/09/2009) Creat: : 1.3 (11/09/2009)   Chol: 118 (03/22/2009)   HDL: 63.50 (03/22/2009)   LDL: 43 (11/08/2008)   TG: 94.0 (11/08/2008)  Problem # 3:  CONSTIPATION, DRUG INDUCED (ICD-564.09) stable Her updated medication list for this problem includes:    Lactulose 10 Gm/83ml Soln (Lactulose) .Marland KitchenMarland KitchenMarland KitchenMarland Kitchen 15cc by mouth daily for bowels, may increase to 30 cc if constipated  Discussed dietary fiber measures and increased water intake.   Complete Medication List: 1)  Os-cal 500 + D 500-200 Mg-unit Tabs (Calcium carbonate-vitamin d) .... Once daily 2)  Clonidine Hcl 0.1 Mg Tabs (Clonidine hcl) .[name].. 1 by mouth three times a day 3)  Vitamin D 81191 Unit Caps (Ergocalciferol) .... One by mouth weekly 4)  Lactulose 10 Gm/71ml Soln (Lactulose) .[name].. 15cc by mouth daily for bowels, may increase to 30 cc if constipated 5)  Levothyroxine Sodium 25 Mcg Tabs (Levothyroxine sodium) ....  One by mouth daily 1/2 hour before breakfast on empty stomach 6)  Fluoridex Sensitivity Relief 1.1-5 % Pste (Sod fluoride-potassium nitrate) .... Use  as directed 7)  Aspirin 81 Mg Tbec (Aspirin) .... Two tablets daily 8)  Coreg 12.5 Mg Tabs (Carvedilol) .[name].. 1 two times a day 9)  Simvastatin 40 Mg Tabs (Simvastatin) .[name].. 1 by mouth daily before bed 10)  Amlodipine Besylate 10 Mg Tabs (Amlodipine besylate) .... Once daily 11)  Nitrostat 0.4 Mg Subl (Nitroglycerin) .... As needed 12)  Diovan 160 Mg Tabs (Valsartan) .... One tablet two times a day 13)  Keppra 250 Mg Tabs (Levetiracetam) .[name].. 1 two times a day 14)  Lasix 20 Mg Tabs (Furosemide) .[name].. 1 every other day  Other Orders: Flu Vaccine 53yrs + MEDICARE PATIENTS ([id number]) Administration Flu vaccine - MCR ([id number])  Hypertension Assessment/Plan:      The patient's hypertensive risk group is category C: Target organ damage and/or diabetes.  Today's blood pressure is 140/80.  Her blood pressure goal is < 140/90.  Patient Instructions: 1)  Please schedule a follow-up appointment in 4 months.   Orders Added: 1)  Flu Vaccine 49yrs + MEDICARE PATIENTS [Q2039] 2)  Administration Flu vaccine - MCR [G0008] 3)  Est. Patient Level IV [08657]

## 2010-03-30 NOTE — Assessment & Plan Note (Signed)
Summary: 2 month rov/njr rsc bmp/njr   Vital Signs:  Patient profile:   77 year old female Height:      59 inches Weight:      148 pounds Temp:     98.2 degrees F oral Pulse rate:   72 / minute Resp:     14 per minute BP sitting:   146 / 80  (left arm)  Vitals Entered By: Willy Eddy, LPN (March 22, 2009 3:48 PM) CC: roa-htn, Hypertension Management   Primary Care Provider:  Stacie Glaze MD  CC:  roa-htn and Hypertension Management.  History of Present Illness:  Hypertension Follow-Up      This is an 77 year old woman who presents for Hypertension follow-up.  The patient denies lightheadedness, urinary frequency, headaches, edema, impotence, rash, and fatigue.  The patient denies the following associated symptoms: chest pain, chest pressure, exercise intolerance, dyspnea, palpitations, syncope, leg edema, and pedal edema.  Compliance with medications (by patient report) has been near 100%.  The patient reports that dietary compliance has been excellent.  The patient reports exercising daily.  Adjunctive measures currently used by the patient include salt restriction.    Hypertension History:      She denies headache, chest pain, palpitations, dyspnea with exertion, orthopnea, PND, peripheral edema, visual symptoms, neurologic problems, syncope, and side effects from treatment.        Positive major cardiovascular risk factors include female age 61 years old or older, hyperlipidemia, and hypertension.  Negative major cardiovascular risk factors include non-tobacco-user status.        Positive history for target organ damage include ASHD (either angina/prior MI/prior CABG) and cardiac end organ damage (either CHF or LVH).   htn   Preventive Screening-Counseling & Management  Alcohol-Tobacco     Smoking Status: never     Year Quit: 1954     Passive Smoke Exposure: no  Problems Prior to Update: 1)  Hyperlipidemia-mixed  (ICD-272.4) 2)  Diastolic Heart Failure, Chronic   (ICD-428.32) 3)  Coronary Atherosclerosis Native Coronary Artery  (ICD-414.01) 4)  Ischemic Cardiomyopathy  (ICD-414.8) 5)  Allergic Urticaria  (ICD-708.0) 6)  Unspecified Venous Insufficiency  (ICD-459.81) 7)  Unspecified Hypothyroidism  (ICD-244.9) 8)  Breast Cyst  (ICD-610.0) 9)  Breast Cyst  (ICD-610.0) 10)  Constipation, Drug Induced  (ICD-564.09) 11)  Uns Advrs Eff Uns Rx Medicinal&biological Sbstnc  (ICD-995.20) 12)  Cough  (ICD-786.2) 13)  Cerebrovascular Accident, Hx of  (ICD-V12.50) 14)  Osteoporosis  (ICD-733.00) 15)  Hypertension  (ICD-401.9)  Medications Prior to Update: 1)  Os-Cal 500 + D 500-200 Mg-Unit Tabs (Calcium Carbonate-Vitamin D) .... Once Daily 2)  Clonidine Hcl 0.1 Mg Tabs (Clonidine Hcl) .[name].. 1 By Mouth Three Times A Day 3)  Vitamin D 96295 Unit  Caps (Ergocalciferol) .... One By Mouth Weekly 4)  Lactulose 10 Gm/33ml  Soln (Lactulose) .[name].. 15cc By Mouth Daily For Bowels, May Increase To 30 Cc If Constipated 5)  Levothyroxine Sodium 25 Mcg Tabs (Levothyroxine Sodium) .... One By Mouth Daily 1/2 Hour Before Breakfast On Empty Stomach 6)  Ensure Nutra Shake Hi-Cal  Liqd (Nutritional Supplements) .... Once Daily 7)  Fluoridex Sensitivity Relief 1.1-5 % Pste (Sod Fluoride-Potassium Nitrate) .... Use  As Directed 8)  Plavix 75 Mg Tabs (Clopidogrel Bisulfate) .[name].. 1 Once Daily 9)  Ascriptin 325 Mg Tabs (Aspirin Buf(Alhyd-Mghyd-Cacar)) .[name].. 1 Once Daily 10)  Coreg 12.5 Mg Tabs (Carvedilol) .[name].. 1 Two Times A Day 11)  Simvastatin 40 Mg Tabs (Simvastatin) .Marland KitchenMarland KitchenMarland Kitchen  1 By Mouth Daily Before Bed 12)  Amlodipine Besylate 10 Mg Tabs (Amlodipine Besylate) .... Once Daily 13)  Nitrostat 0.4 Mg Subl (Nitroglycerin) .... As Needed 14)  Diovan 160 Mg Tabs (Valsartan) .... One Tablet Two Times A Day 15)  Keppra 250 Mg Tabs (Levetiracetam) .[name].. 1 Two Times A Day 16)  Lasix 20 Mg Tabs (Furosemide) .[name].. 1 Every Other Day  Current Medications (verified): 1)  Os-Cal 500 + D 500-200  Mg-Unit Tabs (Calcium Carbonate-Vitamin D) .... Once Daily 2)  Clonidine Hcl 0.1 Mg Tabs (Clonidine Hcl) .[name].. 1 By Mouth Three Times A Day 3)  Vitamin D 04540 Unit  Caps (Ergocalciferol) .... One By Mouth Weekly 4)  Lactulose 10 Gm/41ml  Soln (Lactulose) .[name].. 15cc By Mouth Daily For Bowels, May Increase To 30 Cc If Constipated 5)  Levothyroxine Sodium 25 Mcg Tabs (Levothyroxine Sodium) .... One By Mouth Daily 1/2 Hour Before Breakfast On Empty Stomach 6)  Ensure Nutra Shake Hi-Cal  Liqd (Nutritional Supplements) .... Once Daily 7)  Fluoridex Sensitivity Relief 1.1-5 % Pste (Sod Fluoride-Potassium Nitrate) .... Use  As Directed 8)  Plavix 75 Mg Tabs (Clopidogrel Bisulfate) .[name].. 1 Once Daily 9)  Ascriptin 325 Mg Tabs (Aspirin Buf(Alhyd-Mghyd-Cacar)) .[name].. 1 Once Daily 10)  Coreg 12.5 Mg Tabs (Carvedilol) .[name].. 1 Two Times A Day 11)  Simvastatin 40 Mg Tabs (Simvastatin) .[name].. 1 By Mouth Daily Before Bed 12)  Amlodipine Besylate 10 Mg Tabs (Amlodipine Besylate) .... Once Daily 13)  Nitrostat 0.4 Mg Subl (Nitroglycerin) .... As Needed 14)  Diovan 160 Mg Tabs (Valsartan) .... One Tablet Two Times A Day 15)  Keppra 250 Mg Tabs (Levetiracetam) .[name].. 1 Two Times A Day 16)  Lasix 20 Mg Tabs (Furosemide) .[name].. 1 Every Other Day  Allergies (verified): 1)  ! * Clonidine Patch Glue  Past History:  Family History: Last updated: 06/01/2008 mother passed away from  HTN and age father died from COPD  Social History: Last updated: 09/17/2008 Never Smoked Retired Widowed.  Lives at assisted living [facility]).  Alcohol use-no Drug use-no Regular exercise-no  Risk Factors: Exercise: no (04/02/2007)  Risk Factors: Smoking Status: never (03/22/2009) Passive Smoke Exposure: no (03/22/2009)  Past medical, surgical, family and social histories (including risk factors) reviewed, and no changes noted (except as noted below).  Past Medical History: Reviewed history from 09/17/2008 and no changes  required. 1.  HTN 2.  CAD:  NSTEMI 3/10.  LHC showed 80% mLAD, 90% mCFX.  She had PCI with Endeavor 2.5 x 12 to LAD and Endeavor 3.0 x 12 to CFX.   3.  Diastolic CHF:  Echo (3/10) EF 98-11% with periapical akinesis.  Mild LVH.  Mild MR.  Pseudonormal diastolic function.  Echo (4/10): EF 65% with mild focal basal septal hypertrophy.  Pseudonormal diastolic function.  Normal wall motion.  Moderate biatrial enlargement.  PASP 61 mmHg.  4.  Frequent falls 5.  Lacunar stroke on CT 6.  Expressive aphasia episodes:  More likely atypical migraine than TIA.  Carotid dopplers (8/'09) with 40-60% LICA stenosis.  7.  Left Horners syndrome.  8.  Transient SVT 9.  Hypothyroidism.   Past Surgical History: Reviewed history from 04/02/2007 and no changes required. no surgeries Denies surgical history  Family History: Reviewed history from June 01, 2008 and no changes required. mother passed away from  HTN and age father died from COPD  Social History: Reviewed history from 09/17/2008 and no changes required. Never Smoked Retired Secretary/administrator.  Lives at assisted living ([facility]).  Alcohol use-no Drug use-no Regular exercise-no  Review of Systems       The patient complains of peripheral edema.  The patient denies anorexia, fever, weight loss, weight gain, vision loss, decreased hearing, hoarseness, chest pain, syncope, dyspnea on exertion, prolonged cough, headaches, hemoptysis, abdominal pain, melena, hematochezia, severe indigestion/heartburn, hematuria, incontinence, genital sores, muscle weakness, suspicious skin lesions, transient blindness, difficulty walking, depression, unusual weight change, abnormal bleeding, enlarged lymph nodes, angioedema, and breast masses.    Physical Exam  General:  Elderly female, no apparent distress.  Head:  normocephalic and atraumatic Eyes:  pupils equal and pupils round.  pupils reactive to light.   Ears:  R ear normal and L ear normal.   Nose:  no  deformity, discharge, inflammation, or lesions Mouth:  Teeth, gums and palate normal. Oral mucosa normal. Neck:  Neck supple, JVP 8-9 cm. No masses, thyromegaly or abnormal cervical nodes. Lungs:  Clear bilaterally to auscultation and percussion. Heart:  Non-displaced PMI, chest non-tender; regular rate and rhythm, S1, S2 without rubs or gallops. 2/6 early systolic ejection murmur RUSB.  Carotid upstroke normal, no bruit.  1+ edema L>R to 1/4 up lower legs bilaterally.  Abdomen:  Bowel sounds positive,abdomen soft and non-tender without masses, organomegaly or hernias noted. Msk:  joint tenderness, joint swelling, and joint warmth.   Pulses:  R and L carotid,radial,femoral,dorsalis pedis and posterior tibial pulses are full and equal bilaterally Extremities:  1+ left pedal edema and trace right pedal edema.   Neurologic:  alert & oriented X3 and finger-to-nose normal.     Impression & Recommendations:  Problem # 1:  DIASTOLIC HEART FAILURE, CHRONIC (ICD-428.32) Assessment Unchanged  Her updated medication list for this problem includes:    Plavix 75 Mg Tabs (Clopidogrel bisulfate) .[name]... 1 once daily    Ascriptin 325 Mg Tabs (Aspirin buf(alhyd-mghyd-cacar)) .[name]... 1 once daily    Coreg 12.5 Mg Tabs (Carvedilol) .[name]... 1 two times a day    Diovan 160 Mg Tabs (Valsartan) ..... One tablet two times a day    Lasix 20 Mg Tabs (Furosemide) .[name]... 1 every other day  Echocardiogram: Study Conclusions    1. Left ventricle: There was mild focal basal hypertrophy of the      septum. The estimated ejection fraction was 65%. Wall motion was      normal; there were no regional wall motion abnormalities.      Features are consistent with a pseudonormal left ventricular      filling pattern, with concomitant abnormal relaxation and      increased filling pressure (grade 2 diastolic dysfunction).      Doppler parameters are consistent with elevated ventricular      end-diastolic filling pressure.   2.  Left atrium: The atrium was moderately dilated.   3. Right atrium: The atrium was moderately dilated.   4. Atrial septum: There was increased thickness of the septum,      consistent with lipomatous hypertrophy.   5. Pulmonary arteries: Systolic pressure was moderately to severely      increased. PA peak pressure: 61mm Hg (S).  (06/22/2008)  Problem # 2:  HYPERTENSION (ICD-401.9) Assessment: Unchanged  Her updated medication list for this problem includes:    Clonidine Hcl 0.1 Mg Tabs (Clonidine hcl) .[name]... 1 by mouth three times a day    Coreg 12.5 Mg Tabs (Carvedilol) .[name]... 1 two times a day    Amlodipine Besylate 10 Mg Tabs (Amlodipine besylate) ..... Once daily  Diovan 160 Mg Tabs (Valsartan) ..... One tablet two times a day    Lasix 20 Mg Tabs (Furosemide) .[name]... 1 every other day  BP today: 146/80 Prior BP: 126/62 (02/11/2009)  Prior 10 Yr Risk Heart Disease: N/A (06/09/2008)  Labs Reviewed: K+: 4.1 (02/11/2009) Creat: : 1.4 (02/11/2009)   Chol: 136 (11/08/2008)   HDL: 74.40 (11/08/2008)   LDL: 43 (11/08/2008)   TG: 94.0 (11/08/2008)  Problem # 3:  UNSPECIFIED HYPOTHYROIDISM (ICD-244.9)  Her updated medication list for this problem includes:    Levothyroxine Sodium 25 Mcg Tabs (Levothyroxine sodium) ..... One by mouth daily 1/2 hour before breakfast on empty stomach  Labs Reviewed: TSH: 0.06 (12/05/2007)    Chol: 136 (11/08/2008)   HDL: 74.40 (11/08/2008)   LDL: 43 (11/08/2008)   TG: 94.0 (11/08/2008)  Orders: Venipuncture (11914) TLB-TSH (Thyroid Stimulating Hormone) (84443-TSH) TLB-T4 (Thyrox), Free [phone]) TLB-T3, Free (Triiodothyronine) (84481-T3FREE)  Complete Medication List: 1)  Os-cal 500 + D 500-200 Mg-unit Tabs (Calcium carbonate-vitamin d) .... Once daily 2)  Clonidine Hcl 0.1 Mg Tabs (Clonidine hcl) .[name].. 1 by mouth three times a day 3)  Vitamin D 08657 Unit Caps (Ergocalciferol) .... One by mouth weekly 4)  Lactulose 10 Gm/57ml Soln (Lactulose)  .[name].. 15cc by mouth daily for bowels, may increase to 30 cc if constipated 5)  Levothyroxine Sodium 25 Mcg Tabs (Levothyroxine sodium) .... One by mouth daily 1/2 hour before breakfast on empty stomach 6)  Ensure Nutra Shake Hi-cal Liqd (Nutritional supplements) .... Once daily 7)  Fluoridex Sensitivity Relief 1.1-5 % Pste (Sod fluoride-potassium nitrate) .... Use  as directed 8)  Plavix 75 Mg Tabs (Clopidogrel bisulfate) .[name].. 1 once daily 9)  Ascriptin 325 Mg Tabs (Aspirin buf(alhyd-mghyd-cacar)) .[name].. 1 once daily 10)  Coreg 12.5 Mg Tabs (Carvedilol) .[name].. 1 two times a day 11)  Simvastatin 40 Mg Tabs (Simvastatin) .[name].. 1 by mouth daily before bed 12)  Amlodipine Besylate 10 Mg Tabs (Amlodipine besylate) .... Once daily 13)  Nitrostat 0.4 Mg Subl (Nitroglycerin) .... As needed 14)  Diovan 160 Mg Tabs (Valsartan) .... One tablet two times a day 15)  Keppra 250 Mg Tabs (Levetiracetam) .[name].. 1 two times a day 16)  Lasix 20 Mg Tabs (Furosemide) .[name].. 1 every other day  Other Orders: TLB-Cholesterol, HDL (83718-HDL) TLB-Cholesterol, Direct LDL (83721-DIRLDL) TLB-Cholesterol, Total (82465-CHO)  Hypertension Assessment/Plan:      The patient's hypertensive risk group is category C: Target organ damage and/or diabetes.  Today's blood pressure is 146/80.  Her blood pressure goal is < 140/90.  Patient Instructions: 1)  Please schedule a follow-up appointment in 4 months.

## 2010-03-30 NOTE — Progress Notes (Signed)
  Walk in Patient Form Recieved " Needs FMLA completed" Needs by 1/14 forwarded to [employer] for processing. [name]  March 02, 2009 2:29 PM

## 2010-03-30 NOTE — Progress Notes (Signed)
  Phone Note Call from Patient   Caller: Son Details for Reason: FMLA Summary of Call: Need to Get FMLA completed Initial call taken by: KM  Follow-up for Phone Call       Follow-up by: KM    Son picked up FMLA today from [facility]  Son calling needs FMLA signed and completed by Dr.McLean needs to be turned in by Friday. Will p/u Tomorrow call when ready

## 2010-03-30 NOTE — Progress Notes (Signed)
Summary: B/P readings  Phone Note Outgoing Call   Call placed by: Katina Dung, RN, BSN,  September 28, 2008 3:01 PM Call placed to: Patient Summary of Call: B/P readings  Follow-up for Phone Call        recent B/P readings 09/18/08 147/69 09/22/08 136/70 09/24/08 145/69 09/26/08 139/64 will forward to Dr Shirlee Latch for review and follow-up with patient after review by Dr Shirlee Latch pt had BMP draw today      Appended Document: B/P readings This is probably ok for her.   Appended Document: B/P readings LMVM [phone]  Appended Document: B/P readings talked with son

## 2010-03-30 NOTE — Assessment & Plan Note (Signed)
Summary: 4 month fup//ccm   Vital Signs:  Patient profile:   77 year old female Height:      59 inches Weight:      147 pounds BMI:     29.80 Temp:     98.2 degrees F oral Pulse rate:   68 / minute Resp:     14 per minute BP sitting:   132 / 78  (left arm)  Vitals Entered By: Willy Eddy, LPN (Jul 12, 2009 4:38 PM)  Primary Care Provider:  Stacie Glaze MD   History of Present Illness: The pt had an echo showing preserved ejection fraction The pt has been taking ensure for nutritional support The pt is on a full aspirin and had questions about bruises no increased edema   Hypertension History:      She denies headache, chest pain, palpitations, dyspnea with exertion, orthopnea, PND, peripheral edema, visual symptoms, neurologic problems, syncope, and side effects from treatment.        Positive major cardiovascular risk factors include female age 35 years old or older, hyperlipidemia, and hypertension.  Negative major cardiovascular risk factors include non-tobacco-user status.        Positive history for target organ damage include ASHD (either angina/prior MI/prior CABG) and cardiac end organ damage (either CHF or LVH).     Problems Prior to Update: 1)  Hyperlipidemia-mixed  (ICD-272.4) 2)  Diastolic Heart Failure, Chronic  (ICD-428.32) 3)  Coronary Atherosclerosis Native Coronary Artery  (ICD-414.01) 4)  Ischemic Cardiomyopathy  (ICD-414.8) 5)  Allergic Urticaria  (ICD-708.0) 6)  Unspecified Venous Insufficiency  (ICD-459.81) 7)  Unspecified Hypothyroidism  (ICD-244.9) 8)  Breast Cyst  (ICD-610.0) 9)  Breast Cyst  (ICD-610.0) 10)  Constipation, Drug Induced  (ICD-564.09) 11)  Uns Advrs Eff Uns Rx Medicinal&biological Sbstnc  (ICD-995.20) 12)  Cough  (ICD-786.2) 13)  Cerebrovascular Accident, Hx of  (ICD-V12.50) 14)  Osteoporosis  (ICD-733.00) 15)  Hypertension  (ICD-401.9)  Medications Prior to Update: 1)  Os-Cal 500 + D 500-200 Mg-Unit Tabs (Calcium  Carbonate-Vitamin D) .... Once Daily 2)  Clonidine Hcl 0.1 Mg Tabs (Clonidine Hcl) .[name].. 1 By Mouth Three Times A Day 3)  Vitamin D 27253 Unit  Caps (Ergocalciferol) .... One By Mouth Weekly 4)  Lactulose 10 Gm/51ml  Soln (Lactulose) .[name].. 15cc By Mouth Daily For Bowels, May Increase To 30 Cc If Constipated 5)  Levothyroxine Sodium 25 Mcg Tabs (Levothyroxine Sodium) .... One By Mouth Daily 1/2 Hour Before Breakfast On Empty Stomach 6)  Ensure Nutra Shake Hi-Cal  Liqd (Nutritional Supplements) .... Once Daily 7)  Fluoridex Sensitivity Relief 1.1-5 % Pste (Sod Fluoride-Potassium Nitrate) .... Use  As Directed 8)  Plavix 75 Mg Tabs (Clopidogrel Bisulfate) .[name].. 1 Once Daily 9)  Ascriptin 325 Mg Tabs (Aspirin Buf(Alhyd-Mghyd-Cacar)) .[name].. 1 Once Daily 10)  Coreg 12.5 Mg Tabs (Carvedilol) .[name].. 1 Two Times A Day 11)  Simvastatin 40 Mg Tabs (Simvastatin) .[name].. 1 By Mouth Daily Before Bed 12)  Amlodipine Besylate 10 Mg Tabs (Amlodipine Besylate) .... Once Daily 13)  Nitrostat 0.4 Mg Subl (Nitroglycerin) .... As Needed 14)  Diovan 160 Mg Tabs (Valsartan) .... One Tablet Two Times A Day 15)  Keppra 250 Mg Tabs (Levetiracetam) .[name].. 1 Two Times A Day 16)  Lasix 20 Mg Tabs (Furosemide) .[name].. 1 Every Other Day  Current Medications (verified): 1)  Os-Cal 500 + D 500-200 Mg-Unit Tabs (Calcium Carbonate-Vitamin D) .... Once Daily 2)  Clonidine Hcl 0.1 Mg Tabs (Clonidine Hcl) .Marland KitchenMarland KitchenMarland Kitchen 1  By Mouth Three Times A Day 3)  Vitamin D 16109 Unit  Caps (Ergocalciferol) .... One By Mouth Weekly 4)  Lactulose 10 Gm/74ml  Soln (Lactulose) .[name].. 15cc By Mouth Daily For Bowels, May Increase To 30 Cc If Constipated 5)  Levothyroxine Sodium 25 Mcg Tabs (Levothyroxine Sodium) .... One By Mouth Daily 1/2 Hour Before Breakfast On Empty Stomach 6)  Ensure Nutra Shake Hi-Cal  Liqd (Nutritional Supplements) .... Once Daily 7)  Fluoridex Sensitivity Relief 1.1-5 % Pste (Sod Fluoride-Potassium Nitrate) .... Use  As Directed 8)  Plavix 75 Mg  Tabs (Clopidogrel Bisulfate) .[name].. 1 Once Daily 9)  Ascriptin 325 Mg Tabs (Aspirin Buf(Alhyd-Mghyd-Cacar)) .[name].. 1 Once Daily 10)  Coreg 12.5 Mg Tabs (Carvedilol) .[name].. 1 Two Times A Day 11)  Simvastatin 40 Mg Tabs (Simvastatin) .[name].. 1 By Mouth Daily Before Bed 12)  Amlodipine Besylate 10 Mg Tabs (Amlodipine Besylate) .... Once Daily 13)  Nitrostat 0.4 Mg Subl (Nitroglycerin) .... As Needed 14)  Diovan 160 Mg Tabs (Valsartan) .... One Tablet Two Times A Day 15)  Keppra 250 Mg Tabs (Levetiracetam) .[name].. 1 Two Times A Day 16)  Lasix 20 Mg Tabs (Furosemide) .[name].. 1 Every Other Day  Allergies (verified): 1)  ! * Clonidine Patch Glue  Past History:  Family History: Last updated: 06/04/08 mother passed away from  HTN and age father died from COPD  Social History: Last updated: 05/17/2009 Never Smoked Retired Son Rick involved in her care.  Widowed.  Lives alone but son close by.  Alcohol use-no Drug use-no Regular exercise-no  Risk Factors: Exercise: no (04/02/2007)  Risk Factors: Smoking Status: never (03/22/2009) Passive Smoke Exposure: no (03/22/2009)  Past medical, surgical, family and social histories (including risk factors) reviewed, and no changes noted (except as noted below).  Past Medical History: Reviewed history from 09/17/2008 and no changes required. 1.  HTN 2.  CAD:  NSTEMI 3/10.  LHC showed 80% mLAD, 90% mCFX.  She had PCI with Endeavor 2.5 x 12 to LAD and Endeavor 3.0 x 12 to CFX.   3.  Diastolic CHF:  Echo (3/10) EF 60-45% with periapical akinesis.  Mild LVH.  Mild MR.  Pseudonormal diastolic function.  Echo (4/10): EF 65% with mild focal basal septal hypertrophy.  Pseudonormal diastolic function.  Normal wall motion.  Moderate biatrial enlargement.  PASP 61 mmHg.  4.  Frequent falls 5.  Lacunar stroke on CT 6.  Expressive aphasia episodes:  More likely atypical migraine than TIA.  Carotid dopplers (8/'09) with 40-60% LICA stenosis.  7.  Left Horners syndrome.   8.  Transient SVT 9.  Hypothyroidism.   Past Surgical History: Reviewed history from 04/02/2007 and no changes required. no surgeries Denies surgical history  Family History: Reviewed history from 06/04/08 and no changes required. mother passed away from  HTN and age father died from COPD  Social History: Reviewed history from 05/17/2009 and no changes required. 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  Lives alone but son close by.  Alcohol use-no Drug use-no Regular exercise-no  Review of Systems  The patient denies anorexia, fever, weight loss, weight gain, vision loss, decreased hearing, hoarseness, chest pain, syncope, dyspnea on exertion, peripheral edema, prolonged cough, headaches, hemoptysis, abdominal pain, melena, hematochezia, severe indigestion/heartburn, hematuria, incontinence, genital sores, muscle weakness, suspicious skin lesions, transient blindness, difficulty walking, depression, unusual weight change, abnormal bleeding, enlarged lymph nodes, angioedema, and breast masses.    Physical Exam  General:  Elderly female, no apparent  distress.  Head:  normocephalic and atraumatic Eyes:  pupils equal and pupils round.  pupils reactive to light.   Ears:  R ear normal and L ear normal.   Nose:  no deformity, discharge, inflammation, or lesions Mouth:  Teeth, gums and palate normal. Oral mucosa normal. Neck:  Neck supple, JVP 8-9 cm. No masses, thyromegaly or abnormal cervical nodes. Lungs:  Clear bilaterally to auscultation and percussion. Heart:  Non-displaced PMI, chest non-tender; regular rate and rhythm, S1, S2 without rubs or gallops. 2/6 early systolic ejection murmur RUSB.  Carotid upstroke normal, no bruit.  1+ edema L>R to 1/4 up lower legs bilaterally.  Abdomen:  Bowel sounds positive,abdomen soft and non-tender without masses, organomegaly or hernias noted. Msk:  joint tenderness, joint swelling, and joint warmth.   Extremities:   1+ left pedal edema and trace right pedal edema.   Neurologic:  alert & oriented X3 and finger-to-nose normal.     Impression & Recommendations:  Problem # 1:  DIASTOLIC HEART FAILURE, CHRONIC (ICD-428.32)  Her updated medication list for this problem includes:    Plavix 75 Mg Tabs (Clopidogrel bisulfate) .[name]... 1 once daily    Ascriptin 325 Mg Tabs (Aspirin buf(alhyd-mghyd-cacar)) .[name]... 1 once daily    Coreg 12.5 Mg Tabs (Carvedilol) .[name]... 1 two times a day    Diovan 160 Mg Tabs (Valsartan) ..... One tablet two times a day    Lasix 20 Mg Tabs (Furosemide) .[name]... 1 every other day  Echocardiogram: Study Conclusions    1. Left ventricle: There was mild focal basal hypertrophy of the      septum. The estimated ejection fraction was 65%. Wall motion was      normal; there were no regional wall motion abnormalities.      Features are consistent with a pseudonormal left ventricular      filling pattern, with concomitant abnormal relaxation and      increased filling pressure (grade 2 diastolic dysfunction).      Doppler parameters are consistent with elevated ventricular      end-diastolic filling pressure.   2. Left atrium: The atrium was moderately dilated.   3. Right atrium: The atrium was moderately dilated.   4. Atrial septum: There was increased thickness of the septum,      consistent with lipomatous hypertrophy.   5. Pulmonary arteries: Systolic pressure was moderately to severely      increased. PA peak pressure: 61mm Hg (S).  (06/22/2008)  Problem # 2:  CORONARY ATHEROSCLEROSIS NATIVE CORONARY ARTERY (ICD-414.01) stay on aspirin Her updated medication list for this problem includes:    Clonidine Hcl 0.1 Mg Tabs (Clonidine hcl) .[name]... 1 by mouth three times a day    Plavix 75 Mg Tabs (Clopidogrel bisulfate) .[name]... 1 once daily    Ascriptin 325 Mg Tabs (Aspirin buf(alhyd-mghyd-cacar)) .[name]... 1 once daily    Coreg 12.5 Mg Tabs (Carvedilol) .[name]... 1 two times a day    Amlodipine  Besylate 10 Mg Tabs (Amlodipine besylate) ..... Once daily    Nitrostat 0.4 Mg Subl (Nitroglycerin) .[name]... As needed    Diovan 160 Mg Tabs (Valsartan) ..... One tablet two times a day    Lasix 20 Mg Tabs (Furosemide) .[name]... 1 every other day  Problem # 3:  HYPERTENSION (ICD-401.9)  Her updated medication list for this problem includes:    Clonidine Hcl 0.1 Mg Tabs (Clonidine hcl) .[name]... 1 by mouth three times a day    Coreg 12.5 Mg Tabs (Carvedilol) .[name]... 1 two  times a day    Amlodipine Besylate 10 Mg Tabs (Amlodipine besylate) ..... Once daily    Diovan 160 Mg Tabs (Valsartan) ..... One tablet two times a day    Lasix 20 Mg Tabs (Furosemide) .[name]... 1 every other day  BP today: 132/78 Prior BP: 122/74 (05/17/2009)  Prior 10 Yr Risk Heart Disease: N/A (06/09/2008)  Labs Reviewed: K+: 4.1 (02/11/2009) Creat: : 1.4 (02/11/2009)   Chol: 118 (03/22/2009)   HDL: 63.50 (03/22/2009)   LDL: 43 (11/08/2008)   TG: 94.0 (11/08/2008)  Complete Medication List: 1)  Os-cal 500 + D 500-200 Mg-unit Tabs (Calcium carbonate-vitamin d) .... Once daily 2)  Clonidine Hcl 0.1 Mg Tabs (Clonidine hcl) .[name].. 1 by mouth three times a day 3)  Vitamin D 81191 Unit Caps (Ergocalciferol) .... One by mouth weekly 4)  Lactulose 10 Gm/27ml Soln (Lactulose) .[name].. 15cc by mouth daily for bowels, may increase to 30 cc if constipated 5)  Levothyroxine Sodium 25 Mcg Tabs (Levothyroxine sodium) .... One by mouth daily 1/2 hour before breakfast on empty stomach 6)  Ensure Nutra Shake Hi-cal Liqd (Nutritional supplements) .... Once daily 7)  Fluoridex Sensitivity Relief 1.1-5 % Pste (Sod fluoride-potassium nitrate) .... Use  as directed 8)  Plavix 75 Mg Tabs (Clopidogrel bisulfate) .[name].. 1 once daily 9)  Ascriptin 325 Mg Tabs (Aspirin buf(alhyd-mghyd-cacar)) .[name].. 1 once daily 10)  Coreg 12.5 Mg Tabs (Carvedilol) .[name].. 1 two times a day 11)  Simvastatin 40 Mg Tabs (Simvastatin) .[name].. 1 by mouth daily before bed 12)  Amlodipine  Besylate 10 Mg Tabs (Amlodipine besylate) .... Once daily 13)  Nitrostat 0.4 Mg Subl (Nitroglycerin) .... As needed 14)  Diovan 160 Mg Tabs (Valsartan) .... One tablet two times a day 15)  Keppra 250 Mg Tabs (Levetiracetam) .[name].. 1 two times a day 16)  Lasix 20 Mg Tabs (Furosemide) .[name].. 1 every other day  Hypertension Assessment/Plan:      The patient's hypertensive risk group is category C: Target organ damage and/or diabetes.  Today's blood pressure is 132/78.  Her blood pressure goal is < 140/90.  Patient Instructions: 1)  Please schedule a follow-up appointment in 6  months.

## 2010-04-13 ENCOUNTER — Other Ambulatory Visit: Payer: Self-pay | Admitting: Internal Medicine

## 2010-05-06 ENCOUNTER — Encounter: Payer: Self-pay | Admitting: Cardiology

## 2010-05-10 ENCOUNTER — Encounter: Payer: Self-pay | Admitting: Internal Medicine

## 2010-05-10 ENCOUNTER — Ambulatory Visit: Payer: Self-pay | Admitting: Internal Medicine

## 2010-05-10 ENCOUNTER — Ambulatory Visit (INDEPENDENT_AMBULATORY_CARE_PROVIDER_SITE_OTHER): Payer: Medicare Other | Admitting: Internal Medicine

## 2010-05-10 VITALS — BP 140/78 | HR 69 | Temp 98.2°F | Resp 14 | Ht <= 58 in | Wt 148.0 lb

## 2010-05-10 DIAGNOSIS — I10 Essential (primary) hypertension: Secondary | ICD-10-CM

## 2010-05-10 DIAGNOSIS — E039 Hypothyroidism, unspecified: Secondary | ICD-10-CM

## 2010-05-10 DIAGNOSIS — E785 Hyperlipidemia, unspecified: Secondary | ICD-10-CM

## 2010-05-10 DIAGNOSIS — F329 Major depressive disorder, single episode, unspecified: Secondary | ICD-10-CM

## 2010-05-10 DIAGNOSIS — F32A Depression, unspecified: Secondary | ICD-10-CM | POA: Insufficient documentation

## 2010-05-10 DIAGNOSIS — F3289 Other specified depressive episodes: Secondary | ICD-10-CM

## 2010-05-10 MED ORDER — SERTRALINE HCL 25 MG PO TABS: 25.0000 mg | ORAL_TABLET | Freq: Every day | ORAL | Status: DC

## 2010-05-10 NOTE — Progress Notes (Signed)
Subjective:    Patient ID: [name], female    DOB: Jul 12, 1920, 77 y.o.   MRN: [id number]  HPI   this is an 77 year old white female who presents for followup of hypertension hypothyroidism and hyperlipidemia she is also followed by cardiology and neurology 4 ischemic cardiomyopathy diastolic heart failure and for a history of a seizure disorder.   we discussed her condition with her son states that she is becoming increasingly argumentative and angry their relationship has been strained she signs of vegetative depression she does want to go out of her house she has several excuses for why she can't leave her house.  I have known this woman for some time and did do see signs of depression and anger and hostility that I have never seen before.    Review of Systems  Constitutional: Positive for activity change and fatigue.  HENT: Positive for sneezing and postnasal drip.   Eyes: Negative.   Respiratory: Negative.   Cardiovascular: Negative.   Gastrointestinal: Negative.   Genitourinary: Negative.   Skin: Negative.   Psychiatric/Behavioral: Positive for behavioral problems, sleep disturbance and agitation. Negative for suicidal ideas and self-injury. The patient is nervous/anxious.    Past Medical History  Diagnosis Date  . Hypertension   . Coronary artery disease     NSTEMI 3/10. LHC showed 80% mLAD, 90% mCFX. She had PCI with Endeavor 2.5 x 12 to LAD and Endeavor 3.0 x 12 to CFX  . CHF (congestive heart failure)     Diastolic CHF. Echo (3/10) EF 45-50% with periapical akinesis. Mild LVH. Mild MR. Pseudonormal diastolic function. Echo (4/10): EF 65% with mild focal basal septal hypertrophy. Pseudonormal diastolic function. Normal wall motion. Moderate biatrial enlargement. PASP 61 mmHg. Echo (4/11): EF 55%, inferobasal hypokinesis, mild MR, PA systolic pressure 44 mmHg.  . Frequent falls   . Lacunar stroke     on CT  . Expressive aphasia syndrome     More likely atypical migraine  than TIA. Carotid dopplers (8/09) with 40-60% LICA stenosis.  . Horner's syndrome     left  . SVT (supraventricular tachycardia)     transient  . Hypothyroidism    History reviewed. No pertinent past surgical history.  reports that she has never smoked. She does not have any smokeless tobacco history on file. She reports that she does not drink alcohol or use illicit drugs. family history includes COPD in her father and Hypertension in her mother. Not on File     Objective:   Physical Exam  on physical examination she is a pleasant elderly white female who appears her stated age HEENT reveals arcus senilis neck was supple without JVD or bruit lung fields are clear to auscultation and percussion heart examination revealed regular rate and rhythm with a 1/6 systolic murmur her abdomen was soft her extremity examination revealed 1+ edema bilaterally neuromuscular examination revealed equal grips moderate tenderness on her knees and ankles bilaterally some unsteadiness of gait with eyes closed indicating loss of proprioception and mild peripheral neuropathy.       Assessment & Plan:   acute depressive episode.  The patient is will be treated with sertraline 25 mg by mouth daily for an initial one-month period of time her son will contact us with improvement in her mood and behavior we will titrate his medication as needed a probably to 50 mg by mouth daily we will follow her back in 3 months but will be in constant contact with her son he  gave [place] his cell phone number so that we could have weekly updates.   her hypertension is moderately well-controlled Ms. [name] is slightly elevated at her office visit but after recheck it was 140/78 she is up-to-date on monitoring of her TSH and cholesterol at this time we'll see her back in 3 months time at which time we will have fasting laboratory work done

## 2010-05-15 ENCOUNTER — Ambulatory Visit: Payer: Self-pay | Admitting: Cardiology

## 2010-05-19 ENCOUNTER — Telehealth: Payer: Self-pay | Admitting: Cardiology

## 2010-05-19 NOTE — Telephone Encounter (Signed)
Son Dropped off FMLA papers to care for his Mother, sent to [employer].Marland KitchenMarland KitchenMarland Kitchen

## 2010-05-25 ENCOUNTER — Ambulatory Visit (INDEPENDENT_AMBULATORY_CARE_PROVIDER_SITE_OTHER): Payer: Medicare Other | Admitting: Cardiology

## 2010-05-25 ENCOUNTER — Encounter: Payer: Self-pay | Admitting: Cardiology

## 2010-05-25 VITALS — BP 154/73 | HR 74 | Ht <= 58 in | Wt 144.5 lb

## 2010-05-25 DIAGNOSIS — I2510 Atherosclerotic heart disease of native coronary artery without angina pectoris: Secondary | ICD-10-CM

## 2010-05-25 DIAGNOSIS — I509 Heart failure, unspecified: Secondary | ICD-10-CM

## 2010-05-25 DIAGNOSIS — E785 Hyperlipidemia, unspecified: Secondary | ICD-10-CM

## 2010-05-25 DIAGNOSIS — I10 Essential (primary) hypertension: Secondary | ICD-10-CM

## 2010-05-25 DIAGNOSIS — I5032 Chronic diastolic (congestive) heart failure: Secondary | ICD-10-CM

## 2010-05-25 NOTE — Patient Instructions (Signed)
You are scheduled for a fasting lipid profile Friday April 20,2012. Anytime after 8:30am.  Appointment with Dr Shirlee Latch in 6 months. (September 2012).

## 2010-05-26 NOTE — Progress Notes (Signed)
77 yo with h/o CAD and TIAs vs atypical migraines presents for followup.  She is living at home by herself though her son lives around the corner.  She is able to walk around her house and to the mailbox without exertional dyspnea.  She uses a recumbent bike in her house for exercise.  She has not had any chest pain.  Last echo showed preserved LV systolic function.  She has had no recent neurologic episodes (TIA vs atypical migraine).  No orthopnea.  Weight is down 1 lb.  No lightheadedness with standing.  BP is mildly elevated today but has been normal to borderline when checked at other times recently.  Chronic ankle pain since hitting it with car door.   Labs (8/10): K 4.2, creatinine 1.3 Labs (1/11): LDL 36, HDL 63, TSH normal Labs (9/11): K 4.6, creatinine 1.3  Allergies (verified):  1)  ! * Clonidine Patch Glue  Past Medical History: 1.  HTN 2.  CAD:  NSTEMI 3/10.  LHC showed 80% mLAD, 90% mCFX.  She had PCI with Endeavor 2.5 x 12 to LAD and Endeavor 3.0 x 12 to CFX.   3.  Diastolic CHF:  Echo (3/10) EF 16-10% with periapical akinesis.  Mild LVH.  Mild MR.  Pseudonormal diastolic function.  Echo (4/10): EF 65% with mild focal basal septal hypertrophy.  Pseudonormal diastolic function.  Normal wall motion.  Moderate biatrial enlargement.  PASP 61 mmHg.  Echo (4/11): EF 55%, inferobasal hypokinesis, mild MR, PA systolic pressure 44 mmHg.  4.  Frequent falls 5.  Lacunar stroke on CT 6.  Expressive aphasia episodes:  More likely atypical migraine than TIA.  Carotid dopplers (8/'09) with 40-60% LICA stenosis.  7.  Left Horners syndrome.  8.  Transient SVT 9.  Hypothyroidism.   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  Lives alone but son close by.  Alcohol use-no Drug use-no Regular exercise-no  Current Outpatient Prescriptions  Medication Sig Dispense Refill  .  amLODipine (NORVASC) 10 MG tablet Take 10 mg by mouth daily.        [name] aspirin 81 MG tablet Take 81 mg by mouth daily.        . Calcium Carb-Cholecalciferol (OS-CAL 500 + D) 500-600 MG-UNIT TABS Take 1 tablet by mouth daily.        . carvedilol (COREG) 12.5 MG tablet Take 12.5 mg by mouth 2 (two) times daily with a meal.        . cloNIDine (CATAPRES) 0.1 MG tablet TAKE 1 TABLET 3 TIMES A DAY BY MOUTH  90 tablet  1  . ergocalciferol (VITAMIN D2) 50000 UNITS capsule Take 50,000 Units by mouth once a week.        . furosemide (LASIX) 20 MG tablet Take 20 mg by mouth every other day.        . lactulose (CHRONULAC) 10 GM/15ML solution Take 15 mLs by mouth daily. For bowels, may increase to 30ml if constipated.       . levETIRAcetam (KEPPRA) 250 MG tablet Take 250 mg by mouth 2 (two) times daily.        [name] levothyroxine (SYNTHROID, LEVOTHROID) 25 MCG tablet Take 25 mcg by mouth daily. before breakfast on empty stomach       . nitroGLYCERIN (NITROSTAT) 0.4 MG SL tablet Place 0.4 mg under the tongue every 5 (five) minutes  as needed.        . sertraline (ZOLOFT) 25 MG tablet Take 1 tablet (25 mg total) by mouth daily.  30 tablet  2  . simvastatin (ZOCOR) 40 MG tablet Take 40 mg by mouth at bedtime.        . Sod Fluoride-Potassium Nitrate (FLUORIDEX SENSITIVITY RELIEF) 1.1-5 % GEL Place onto teeth as directed.        . valsartan (DIOVAN) 160 MG tablet Take 160 mg by mouth 2 (two) times daily.          BP 154/73  Pulse 74  Ht 4\' 7"  (1.397 m)  Wt 144 lb 8 oz (65.545 kg)  BMI 33.59 kg/m2 General:  Elderly female, no apparent distress.  Neck:  Neck supple, no JVD. No masses, thyromegaly or abnormal cervical nodes. Lungs:  Clear bilaterally to auscultation and percussion. Heart:  Non-displaced PMI, chest non-tender; regular rate and rhythm, S1, S2 without rubs or gallops. 2/6 early systolic ejection murmur RUSB.  Carotid upstroke normal, no bruit.  Trace ankle edema.  Abdomen:  Bowel sounds positive;  abdomen soft and non-tender without masses, organomegaly, or hernias noted. No hepatosplenomegaly. Extremities:  No clubbing or cyanosis. Neurologic:  Alert and oriented x 3. Psych:  Normal affect.

## 2010-05-26 NOTE — Assessment & Plan Note (Signed)
Euvolemic, minimal dyspnea.  Continue current Lasix regimen.  EF normal on last echo with mild pulmonary hypertension suggesting LV diastolic dysfunction.

## 2010-05-26 NOTE — Assessment & Plan Note (Signed)
BP is a bit elevated today but has been good at other times when checked recently.  I will not adjust her regimen today.

## 2010-05-26 NOTE — Assessment & Plan Note (Signed)
No ischemic symptoms.  OK to decrease aspirin to 162 mg daily.  Continue Coreg, ARB, and statin.

## 2010-05-26 NOTE — Assessment & Plan Note (Signed)
Due for lipids.  Goal LDL < 70.

## 2010-05-28 ENCOUNTER — Other Ambulatory Visit: Payer: Self-pay | Admitting: Cardiology

## 2010-06-06 LAB — LIPID PANEL
Cholesterol: 113 mg/dL (ref 0–200)
LDL Cholesterol: 36 mg/dL (ref 0–99)
Triglycerides: 100 mg/dL (ref <150)
VLDL: 20 mg/dL (ref 0–40)

## 2010-06-06 LAB — GLUCOSE, CAPILLARY
Glucose-Capillary: 101 mg/dL — ABNORMAL HIGH (ref 70–99)
Glucose-Capillary: 104 mg/dL — ABNORMAL HIGH (ref 70–99)
Glucose-Capillary: 111 mg/dL — ABNORMAL HIGH (ref 70–99)
Glucose-Capillary: 112 mg/dL — ABNORMAL HIGH (ref 70–99)
Glucose-Capillary: 115 mg/dL — ABNORMAL HIGH (ref 70–99)
Glucose-Capillary: 91 mg/dL (ref 70–99)
Glucose-Capillary: 93 mg/dL (ref 70–99)
Glucose-Capillary: 98 mg/dL (ref 70–99)

## 2010-06-06 LAB — HOMOCYSTEINE: Homocysteine: 11.5 umol/L (ref 4.0–15.4)

## 2010-06-07 LAB — DIFFERENTIAL
Basophils Absolute: 0.1 10*3/uL (ref 0.0–0.1)
Basophils Relative: 1 % (ref 0–1)
Lymphocytes Relative: 13 % (ref 12–46)
Neutro Abs: 7.8 10*3/uL — ABNORMAL HIGH (ref 1.7–7.7)
Neutrophils Relative %: 78 % — ABNORMAL HIGH (ref 43–77)

## 2010-06-07 LAB — COMPREHENSIVE METABOLIC PANEL
ALT: 20 U/L (ref 0–35)
Albumin: 3.7 g/dL (ref 3.5–5.2)
Alkaline Phosphatase: 72 U/L (ref 39–117)
Alkaline Phosphatase: 76 U/L (ref 39–117)
BUN: 20 mg/dL (ref 6–23)
BUN: 26 mg/dL — ABNORMAL HIGH (ref 6–23)
CO2: 24 mEq/L (ref 19–32)
Chloride: 103 mEq/L (ref 96–112)
Creatinine, Ser: 1.1 mg/dL (ref 0.4–1.2)
GFR calc non Af Amer: 55 mL/min — ABNORMAL LOW (ref >60)
Glucose, Bld: 163 mg/dL — ABNORMAL HIGH (ref 70–99)
Glucose, Bld: 99 mg/dL (ref 70–99)
Potassium: 3.4 mEq/L — ABNORMAL LOW (ref 3.5–5.1)
Potassium: 3.7 mEq/L (ref 3.5–5.1)
Sodium: 138 mEq/L (ref 135–145)
Total Bilirubin: 0.7 mg/dL (ref 0.3–1.2)
Total Bilirubin: 0.8 mg/dL (ref 0.3–1.2)
Total Protein: 7.9 g/dL (ref 6.0–8.3)

## 2010-06-07 LAB — CBC
Hemoglobin: 13.3 g/dL (ref 12.0–15.0)
MCHC: 35 g/dL (ref 30.0–36.0)
Platelets: 274 10*3/uL (ref 150–400)
RDW: 13.1 % (ref 11.5–15.5)

## 2010-06-07 LAB — APTT: aPTT: 31 seconds (ref 24–37)

## 2010-06-07 LAB — GLUCOSE, CAPILLARY: Glucose-Capillary: 118 mg/dL — ABNORMAL HIGH (ref 70–99)

## 2010-06-07 LAB — TROPONIN I: Troponin I: 0.02 ng/mL (ref 0.00–0.06)

## 2010-06-07 LAB — CK TOTAL AND CKMB (NOT AT ARMC)
CK, MB: 4.5 ng/mL — ABNORMAL HIGH (ref 0.3–4.0)
Total CK: 74 U/L (ref 7–177)

## 2010-06-07 LAB — PROTIME-INR: INR: 1 (ref 0.00–1.49)

## 2010-06-08 LAB — DIFFERENTIAL
Eosinophils Absolute: 0 10*3/uL (ref 0.0–0.7)
Lymphs Abs: 1.1 10*3/uL (ref 0.7–4.0)
Neutrophils Relative %: 86 % — ABNORMAL HIGH (ref 43–77)

## 2010-06-08 LAB — CBC
HCT: 29.4 % — ABNORMAL LOW (ref 36.0–46.0)
HCT: 32.1 % — ABNORMAL LOW (ref 36.0–46.0)
Hemoglobin: 11.1 g/dL — ABNORMAL LOW (ref 12.0–15.0)
MCHC: 34.4 g/dL (ref 30.0–36.0)
MCHC: 34.7 g/dL (ref 30.0–36.0)
MCHC: 35.5 g/dL (ref 30.0–36.0)
MCV: 96.3 fL (ref 78.0–100.0)
MCV: 98.5 fL (ref 78.0–100.0)
MCV: 99 fL (ref 78.0–100.0)
Platelets: 219 10*3/uL (ref 150–400)
Platelets: 234 10*3/uL (ref 150–400)
Platelets: 256 10*3/uL (ref 150–400)
Platelets: 362 10*3/uL (ref 150–400)
RBC: 3.19 MIL/uL — ABNORMAL LOW (ref 3.87–5.11)
RDW: 13 % (ref 11.5–15.5)
WBC: 13.8 10*3/uL — ABNORMAL HIGH (ref 4.0–10.5)
WBC: 22.3 10*3/uL — ABNORMAL HIGH (ref 4.0–10.5)

## 2010-06-08 LAB — BASIC METABOLIC PANEL
BUN: 24 mg/dL — ABNORMAL HIGH (ref 6–23)
BUN: 36 mg/dL — ABNORMAL HIGH (ref 6–23)
BUN: 40 mg/dL — ABNORMAL HIGH (ref 6–23)
CO2: 23 mEq/L (ref 19–32)
CO2: 24 mEq/L (ref 19–32)
CO2: 25 mEq/L (ref 19–32)
CO2: 25 mEq/L (ref 19–32)
Calcium: 8.9 mg/dL (ref 8.4–10.5)
Calcium: 9.6 mg/dL (ref 8.4–10.5)
Chloride: 103 mEq/L (ref 96–112)
Chloride: 103 mEq/L (ref 96–112)
Chloride: 111 mEq/L (ref 96–112)
Creatinine, Ser: 1.19 mg/dL (ref 0.4–1.2)
Creatinine, Ser: 1.79 mg/dL — ABNORMAL HIGH (ref 0.4–1.2)
GFR calc Af Amer: 38 mL/min — ABNORMAL LOW (ref >60)
GFR calc non Af Amer: 38 mL/min — ABNORMAL LOW (ref >60)
Glucose, Bld: 100 mg/dL — ABNORMAL HIGH (ref 70–99)
Glucose, Bld: 102 mg/dL — ABNORMAL HIGH (ref 70–99)
Glucose, Bld: 115 mg/dL — ABNORMAL HIGH (ref 70–99)
Potassium: 3.2 mEq/L — ABNORMAL LOW (ref 3.5–5.1)
Potassium: 4 mEq/L (ref 3.5–5.1)
Sodium: 135 mEq/L (ref 135–145)
Sodium: 136 mEq/L (ref 135–145)
Sodium: 138 mEq/L (ref 135–145)

## 2010-06-08 LAB — CARDIAC PANEL(CRET KIN+CKTOT+MB+TROPI)
CK, MB: 15.3 ng/mL — ABNORMAL HIGH (ref 0.3–4.0)
CK, MB: 28.6 ng/mL — ABNORMAL HIGH (ref 0.3–4.0)
Total CK: 3096 U/L — ABNORMAL HIGH (ref 7–177)
Total CK: 4279 U/L — ABNORMAL HIGH (ref 7–177)
Troponin I: 2.84 ng/mL (ref 0.00–0.06)
Troponin I: 5.29 ng/mL (ref 0.00–0.06)
Troponin I: 5.83 ng/mL (ref 0.00–0.06)

## 2010-06-08 LAB — LIPID PANEL
Cholesterol: 129 mg/dL (ref 0–200)
HDL: 65 mg/dL (ref >39)
Total CHOL/HDL Ratio: 2.3 RATIO
Total CHOL/HDL Ratio: 2.4 RATIO

## 2010-06-08 LAB — URINALYSIS, ROUTINE W REFLEX MICROSCOPIC
Bilirubin Urine: NEGATIVE
Protein, ur: 100 mg/dL — AB
Urobilinogen, UA: 0.2 mg/dL (ref 0.0–1.0)

## 2010-06-08 LAB — URINE CULTURE: Colony Count: >=100000

## 2010-06-08 LAB — PROTIME-INR
INR: 1 (ref 0.00–1.49)
Prothrombin Time: 12.8 seconds (ref 11.6–15.2)

## 2010-06-08 LAB — CK TOTAL AND CKMB (NOT AT ARMC)
CK, MB: 60.9 ng/mL — ABNORMAL HIGH (ref 0.3–4.0)
Relative Index: 1.3 (ref 0.0–2.5)

## 2010-06-08 LAB — GLUCOSE, CAPILLARY: Glucose-Capillary: 120 mg/dL — ABNORMAL HIGH (ref 70–99)

## 2010-06-08 LAB — URINE MICROSCOPIC-ADD ON

## 2010-06-08 LAB — HEPARIN LEVEL (UNFRACTIONATED): Heparin Unfractionated: 0.2 IU/mL — ABNORMAL LOW (ref 0.30–0.70)

## 2010-06-13 ENCOUNTER — Other Ambulatory Visit: Payer: Self-pay | Admitting: Internal Medicine

## 2010-06-16 ENCOUNTER — Other Ambulatory Visit (INDEPENDENT_AMBULATORY_CARE_PROVIDER_SITE_OTHER): Payer: Medicare Other | Admitting: *Deleted

## 2010-06-16 DIAGNOSIS — I509 Heart failure, unspecified: Secondary | ICD-10-CM

## 2010-06-16 DIAGNOSIS — I2510 Atherosclerotic heart disease of native coronary artery without angina pectoris: Secondary | ICD-10-CM

## 2010-06-16 LAB — LIPID PANEL
Cholesterol: 121 mg/dL (ref 0–200)
LDL Cholesterol: 43 mg/dL (ref 0–99)
VLDL: 21.2 mg/dL (ref 0.0–40.0)

## 2010-07-11 NOTE — Procedures (Signed)
EEG NUMBER:  [id number].   CLINICAL HISTORY:  The patient is an 77 year old woman with a history of  seizure, garbled speech, head drops, and then extended deviation of her  head to the right whole body tonic-clonic jerking lasting 30-40 seconds.  She had 2 similar episodes.  She has a history of hypertension,  hypothyroidism, and coronary artery disease.(780.02)  (345.4   PROCEDURE:  The tracing is carried out on a 32-digital Cadwell recorder  reformatted into 16-channel montages with 1 devoted to EKG.  The patient  was awake and drowsy during the recording.  The [employer] 10/20  system lead placement was used.   MEDICATIONS:  Plavix, aspirin, Norvasc, lisinopril, Catapres, Zocor,  Coreg, levothyroxine, Lovenox, Keppra, and Ativan.   DESCRIPTION OF FINDINGS:  Dominant frequency is a 10 Hz, alpha-range  activity of 25 microvolts.  Background activity is a mixture of alpha  and beta-range activity.  In the left temporal region, there appears to  be dysrhythmic theta-range activity of 30 microvolts.  This was seen in  multiple montages and does not appear to be electrode artifact.   EKG showed regular sinus rhythm with ventricular response of 66 beats  per minute.   IMPRESSION:  Abnormal EEG on the basis of mild slowing in the left  temporal region without evidence of seizures.  This could be a postictal  change.  It could be related to underlying structural and/or vascular  abnormality.  This requires careful clinical correlation.      Deanna Artis. Sharene Skeans, M.D.  Electronically Signed     [id number]  D:  06/28/2008 22:46:49  T:  06/29/2008 08:02:20  Job #:  [id number]   cc:   Levert Feinstein, MD

## 2010-07-11 NOTE — Discharge Summary (Signed)
NAME:  [name], [name] NO.:  [id number]   MEDICAL RECORD NO.:  [id number]          PATIENT TYPE:  INP   LOCATION:  2504                         FACILITY:  [facility]   PHYSICIAN:  Noralyn Pick. Eden Emms, MD, FACCDATE OF BIRTH:  1920/12/28   DATE OF ADMISSION:  04/27/2008  DATE OF DISCHARGE:  05/01/2008                               DISCHARGE SUMMARY   PRIMARY CARDIOLOGIST:  Marca Ancona, MD   DISCHARGE DIAGNOSES:  1. Non-ST-elevation myocardial infarction.      a.     Status post drug-eluting stenting of left anterior       descending and circumflex arteries.  2. Preserved left ventricular function.      a.     By 2-D echocardiogram.      b.     No evidence of mural thrombus.  3. Expressive aphasia.      a.     Transient ischemic attack versus atypical migraine.      b.     History of transient ischemic attack.      c.     Left Horner syndrome.  4. Transient supraventricular tachycardia.   SECONDARY DIAGNOSES:  1. Hypertension.  2. Hypothyroidism.   REASON FOR ADMISSION:  [name] is an 77 year old female with no known  history of heart disease, and with a history of recurrent TIA, who  presented with expressive aphasia and fall.  She was referred to Dr.  Marca Ancona for evaluation of an abnormal electrocardiogram notable  diffuse ST elevation and a pericarditis-like pattern.  Troponins were  also slightly elevated.  Recommendation was to proceed with diagnostic  coronary angiography.   PROCEDURES:  1. Drug-eluting stenting of 90% mid circumflex artery stenosis;      cutting balloon angioplasty and Endeavor drug-eluting stenting of      an 80% mid left anterior descending artery stenosis, March 4.  2. Carotid Dopplers:  No significant bilateral ICA stenosis, with      bilateral antegrade vertebral artery flow.  3. 2-D echocardiogram:  EF 50-55%; large area of akinesis involving      anterolateral/periapical walls; no definite evidence of a mural      thrombus;  mild mitral regurgitation; moderate tricuspid      regurgitation.   HOSPITAL COURSE:  Following referral to our service for evaluation of  abnormal electrocardiogram, following presentation with evidence  suggestive of recurrent TIA, serial cardiac markers were drawn and  notable for a peak troponin of 5.8.  Of note, total CPKs were elevated  with a peak of nearly 5000, but with negative relative index throughout.  The patient was treated with IV nitroglycerin and heparin, and  subsequently referred for diagnostic coronary angiography, which yielded  severe 2-vessel coronary artery disease, as outlined above.  She  underwent successful percutaneous intervention, by Dr. Marca Ancona,  with drug-eluting stenting of high-grade stenosis of both CFX and LAD  arteries, with no noted complications.  Dr. Shirlee Latch recommended  combination therapy of aspirin and Plavix for at least 12 months.   The patient was closely followed by the Neurology  team, who felt that  the left facial droop and associated dysarthria was consistent  vertebrobasilar insufficiency.  They also felt that she had findings  consistent with left Horner syndrome, with eyelid ptosis and miosis.   Workup consisted of a 2-D echocardiogram and carotid Dopplers, the  latter indicating no significant bilateral ICA stenosis.  They also  recommended a followup MRI/MRA study of the brain in approximately 6  weeks.   Postoperatively, the patient was noted to have a transient run of narrow  complex tachycardia, approximately 148 bpm, lasting only approximately 6  seconds and during which the patient was asymptomatic.   The patient was cleared for discharge on hospital day #4, in  hemodynamically stable condition.  There were no noted complications of  the right groin.   DISCHARGE LABORATORY DATA:  None.   DISPOSITION:  Stable.   DISCHARGE MEDICATIONS:  1. Plavix 150 mg (x7 days total), then 75 mg daily.  2. Coated aspirin 325 mg  daily.  3. Simvastatin 40 mg at bedtime.  4. Coreg 12.5 mg b.i.d.  5. Synthroid 0.025 mg daily.  6. Clonidine 0.1 mg b.i.d.  7. Amlodipine 5 mg daily.  8. Lisinopril 10 mg daily.  9. Lactulose, as previously directed.  10.Nitrostat 0.4 mg, as needed.   FOLLOWUP INSTRUCTIONS:  1. Arrangements will be made for Ms. Sia to follow up with Dr.      Marca Ancona in 2 weeks, in our [place] office.  The patient      will be contacted by our office.  2. The patient is to contact our office if there is any evidence of      swelling or bleeding over the right groin incision      site.  She is to refrain from any heavy lifting or driving for at      least 2 days.  3. The patient will need a followup MRI/MRA imaging scan of the brain      in approximately 6 weeks.   DISCHARGE ENCOUNTER:  Greater than 30 minutes duration, including  physician time.      Gene Serpe, PA-C      Peter C. Eden Emms, MD, [facility]  Electronically Signed    GS/MEDQ  D:  05/01/2008  T:  05/02/2008  Job:  [id number]   cc:   Stacie Glaze, MD  Deanna Artis. Sharene Skeans, M.D.

## 2010-07-11 NOTE — Cardiovascular Report (Signed)
NAME:  [name], [name]                  ACCOUNT NO.:  [id number]   MEDICAL RECORD NO.:  [id number]           PATIENT TYPE:   LOCATION:                                 FACILITY:   PHYSICIAN:  [name]. Excell Seltzer, MD  DATE OF BIRTH:  1920/08/02   DATE OF PROCEDURE:  04/29/2008  DATE OF DISCHARGE:                            CARDIAC CATHETERIZATION   PROCEDURES:  1. Percutaneous transluminal coronary angioplasty and stenting of the      left circumflex.  2. Percutaneous transluminal coronary angioplasty, cutting balloon      angioplasty, stenting, and intracoronary vascular ultrasound of the      left anterior descending.   PROCEDURAL INDICATION:  Ms. [name] is an 77 year old woman who presented  with a TIA.  She had expressive aphasia.  While in the emergency  department, she was noted to have transient ST elevation and her cardiac  enzymes were positive.  A bedside 2-D echocardiogram was done that  showed a focal wall motion abnormality of the apex and inferior apex of  the left ventricle.  She was referred for diagnostic catheterization.  This showed severe 2-vessel coronary artery disease involving the mid  left circumflex and mid LAD.  The left circumflex has a 90% eccentric  stenosis, and the midportion of the LAD has a very heavily calcified 80%  stenosis in the midportion with a filling defect.  The angioplasty  procedure was done in a staged fashion because of the patient's chronic  kidney disease.  After hydration and Mucomyst, her creatinine was 1.3  today for her PCI.  A total of 120 mL of contrast was used for the  procedure.   Risks and indications of the procedure were reviewed with the patient,  informed consent was obtained, right groin was prepped and draped, and  anesthetized with 1% lidocaine.  Using modified Seldinger technique, a 6-  French sheath was placed in the right femoral artery.  A 6-French 3.5 cm  Q guide was used.  Attention was initially turned to the left  circumflex.  Angiomax was used for anticoagulation.  The patient has  been pretreated with Plavix.  A Cougar guidewire was used to cross the  lesion, and this was done without difficulty.  The vessel was predilated  with a 2.5 x 8 mm Apex balloon.  This was inflated to 8 atmospheres with  improvement in vessel immediately after angioplasty.  The lesion was  focal, and I elected to stent it with a 3.0 x 12 mm Endeavor drug-  eluting stent.  This was carefully positioned and deployed at 10  atmospheres to cover the entire lesion.  The stent was well expanded.  It was then postdilated with a 3.25 x 8 mm Quantum Maverick balloon,  which was taken to 14 atmospheres in the proximal portion and 12  atmospheres in the distal portion.  There was an excellent angiographic  result at the completion of the stenting procedure with a well-expanded  stent, 0% residual stenosis, and TIMI-3 flow.   Attention was then turned to the LAD.  The vessel was  wired with the  same Cougar guidewire without difficulty.  I attempted IVUS the vessel  to evaluate whether the hazy, stenotic area was calcification versus  thrombus.  There was clearly some calcification in the vessel, but the  filling defect was fairly marked.  The IVUS would not cross the area.  Therefore, I predilated the vessel with a 2.0 x 8 mm Quantum Maverick  balloon, which was inflated at 10 and then 12 atmospheres on 2  inflations.  The balloon did not appear very well expanded.  Therefore,  I went in with a bigger balloon.  An 2.5 x 8 mm Apex balloon was used.  It was carefully positioned and inflated to 8 atmospheres.  There was  some improvement in the area after the balloon inflation with a 2.5 mm  balloon.  There is an irregular area that had the appearance of a focal  dissection plane.  To ensure that stent expansion would be reasonably  good, I elected the pretreat the area with a cutting balloon.  A 2.5 x  10 mm Flextome cutting balloon  was advanced and inflated to 8  atmospheres.  It appeared well expanded.  The appearance of the vessel  was greatly improved after the cutting balloon.  The vessel was then  stented with a 2.5 x 12 mm Endeavor stent, which was taken to 12  atmospheres and appeared well expanded.  The stent was postdilated with  a 2.75 x 8 mm Quantum Maverick balloon, which was taken to 16  atmospheres on 2 inflations to cover the entire stented segment.  There  was some residual stenosis of the proximal edge of the stent involving  the origin of a large first diagonal branch.  I did not treat that area  because of fear of compromising the diagonal.  This stenosis did not  appear severe.  After postdilatation, the IVUS catheter was advanced  distal to the stent and an automated pullback was performed.  It  demonstrated good stent expansion throughout.  There was an area of  heavy calcification with very mild plaque prolapse but overall well  expanded stent, and the proximal transition was good.  There was  calcified plaque proximal to the stent, but it was nonobstructive.  Final image was performed and showed an excellent angiographic result  with TIMI-3 flow.  The patient tolerated the procedure well.  There are  no immediate complications, and femoral angiogram at the completion of  the procedure showed access just at the bifurcation and therefore a  closure device was not used.   ASSESSMENT:  Successful 2-vessel percutaneous coronary intervention with  drug-eluting stent placement in the mid left circumflex and mid left  anterior descending.   RECOMMENDATIONS:  Dual antiplatelet therapy with aspirin and Plavix for  12 months.      [name]. Excell Seltzer, MD  Electronically Signed     MDC/MEDQ  D:  04/29/2008  T:  04/30/2008  Job:  [id number]   cc:   Marca Ancona, MD  Stacie Glaze, MD

## 2010-07-11 NOTE — Consult Note (Signed)
NAME:  [name], [name] NO.:  [id number]   MEDICAL RECORD NO.:  [id number]          PATIENT TYPE:  INP   LOCATION:  2116                         FACILITY:  [facility]   PHYSICIAN:  Marca Ancona, MD      DATE OF BIRTH:  [birth year]   DATE OF CONSULTATION:  DATE OF DISCHARGE:                                 CONSULTATION   PRIMARY CARE PHYSICIAN:  Stacie Glaze, MD   CARDIOLOGIST:  Will be new, Marca Ancona, MD   REASON FOR CONSULTATION:  Abnormal EKG with ST elevation.   HISTORY OF PRESENT ILLNESS:  This is an 77 year old Caucasian female  with long-standing history of TIAs, approximately every 3 months over  the last 2 years and hypertension with frequent falls who was found by  her son in the bathroom when he could not get pulled over her on the  phone.  She was on the floor with her lip bleeding with injury to the  right side of her face and chin and left ankle.  She was brought to the  emergency room via EMS, was very unhappy about being brought to the  hospital, and was very angry with her son concerning this.  EKG was  completed during evaluation revealing ST elevation inferiorly and  laterally.  The patient denied any chest pain, palpitations, dyspnea,  and thinks the fall was due to tripping.  The patient has expressive  aphasia, and it is difficult to understand more than yes/no answers from  her; however, her son at bedside is a very good historian and is able to  give [place] the series of events.   REVIEW OF SYSTEMS:  Negative for chest pain, shortness of breath,  dyspnea, nausea, vomiting, dizziness, or diaphoresis.  Positive for an  expressive dysphasia.   PAST MEDICAL HISTORY:  1. Hypertension.  2. Frequent TIAs every 3 months over the last 2 years.  3. Frequent falls.  4. Recent diagnosis of hypothyroidism.   SOCIAL HISTORY:  She lives in [place] alone.  She is recently widowed  in September.  She has a son, who lives nearby and cares for her.   She  does not smoke, does not drink alcohol.   FAMILY HISTORY:  Her mother's history is not known, but she did have  some lung disease.  Her father also had some lung disease.  Both are  deceased.   CURRENT MEDICATIONS:  1. Aggrenox 1 b.i.d.  2. Diovan hydrochlorothiazide 320/25 daily.  3. Clonidine 0.2 mg t.i.d.  4. Os-Cal plus daily.  5. Multivitamin daily.  6. Stool softener daily.  7. Ambien 5 mg at night.  8. Synthroid 25 mcg daily.   ALLERGIES:  No known drug allergies.   CURRENT LABORATORY DATA:  Troponin 0.58 on point-of-care.  He hemoglobin  12.9, hematocrit 36.7, white blood cells 22.3, platelets 362.  Sodium  135, potassium 3.6, chloride 98, CO2 25, BUN 40, creatinine 1.7, glucose  100.  PTT 33.   EKG revealing sinus tachycardia, ventricular rate of 95 beats per minute  with diffuse ST elevation and a  pericarditis-like pattern.  CT scan of  the head revealed chronic ischemic changes but nothing acute.   PHYSICAL EXAMINATION:  VITAL SIGNS:  Blood pressure 175/69, now 198/100  on assessment; pulse 96, now 106 on assessment; respirations 16;  temperature 97.0; O2 sat 96% on 2 liters.  GENERAL:  She is awake, alert, and oriented.  HEENT:  The patient has PERRL.  There is a lip laceration and right  cheek bruising and a chin laceration noted.  NECK:  Supple was positive JVD at 10 cm.  CARDIOVASCULAR:  Regular rate and rhythm, tachycardic with 2/6 systolic  murmur along with an S3 murmur.  She has carotid bruits bilaterally,  abdominal, renal, and femoral bruits bilaterally to auscultation.  LUNGS:  Clear to auscultation.  ABDOMEN:  Soft, nontender with 2+ bowel sounds.  EXTREMITIES:  Bruising around the left ankle with some edema along with  right ankle bruising and multiple bruises noted on her arms and legs.   IMPRESSION:  1. Abnormal occult EKG in the setting of recent transient ischemic      attack with facial and left ankle injury with diffuse inferior and       anterior ST elevation on ECG.  2. History of frequent transient ischemic attacks x2 years.  3. History of hypertension.   PLAN:  The patient has been seen and examined by Dr. Marca Ancona.  She  is an 77 year old female with history of TIAs manifesting as expressive  aphasia, another episode consistent with a TIA and fall this a.m.  We  are consulted for abnormal EKG.  The patient has expressive aphasia, so  most history comes from her son.  The son states that the patient had  TIA this a.m., typical TIA involves expressive aphasia.  She has had  this several times before.  She fell in the bathroom.  She denies change  of level of consciousness and she was brought to the emergency room  secondary to this fall, and of note, the patient was extremely upset  about coming to the ER per son.  Her aphasia is improving.  No acute  changes on head CT.  EKG with diffuse ST elevations along with elevated  troponins.  She has had no chest pain or dyspnea at all.   The patient has had no chest pain, no shortness of breath, and feels  completely normal.  Bedside echocardiogram reveals an EF of 45%.  There  is akinesis of the apical segments with hyperkinesia of the basal  segments. This could be suggestive of Takotsubo cardiomyopathy, brought  on by stressful event (very angry this a.m.) versus neurological event  but it also could be seen in the setting of an MI involving the  midportion of a large LAD.   We would recommend need to rule out CAD.  We will repeat cardiac  enzymes, is still mildly elevated. We will do a left heart  catheterization today.  Presentation is atypical with no chest pain but  elevated enzymes/ECG are obviously worrisome.  We will give Mucomyst for  chronic kidney disease with a creatinine of 1.7.  Her repeat cardiac  enzymes very elevated.  We will treat hypertension with nitroglycerin  and titrate up.  We will need to continue clonidine to prevent  withdrawal  hypertension and start Coreg 6.25 mg daily.  We will also  start heparin drip and aspirin.      Bettey Mare. Lyman Bishop, NP      Marca Ancona, MD  Electronically  Signed    KML/MEDQ  D:  04/27/2008  T:  04/28/2008  Job:  [id number]   cc:   Stacie Glaze, MD

## 2010-07-11 NOTE — Consult Note (Signed)
NAME:  [name], MCCALISTER NO.:  [id number]   MEDICAL RECORD NO.:  [id number]          PATIENT TYPE:  INP   LOCATION:  2504                         FACILITY:  [facility]   PHYSICIAN:  Deanna Artis. Hickling, M.D.DATE OF BIRTH:  1920-10-10   DATE OF CONSULTATION:  04/29/2008  DATE OF DISCHARGE:                                 CONSULTATION   CHIEF COMPLAINT:  Evaluate recurrent facial weakness and slurred speech.   HISTORY OF THE PRESENT CONDITION:  [name] is an 77 year old woman  who has had a 2-year history of episodes of dysarthria and left facial  droop that occurs every 3 months or so.  She does not really recall how  often it happens, but it has happened recently within the past couple of  weeks.   The patient has had a number of falls from a truck, in her bathroom, the  latter occurred on the day of admission.  She was found in the bathroom  by her son with her lip bleeding and injury to the right side of her  face bruising of her shin.   Earlier that day, she had an episode of slurred speech and facial droop.  Her son had seen her since this has recurred and recovered completely on  numerous occasions, he went off to work.  When she did not answer him  the second time that day, he went home to find her on the bathroom  floor.   The patient remembers having an episode of similar to those that she has  experienced, and at that time developed ptosis of her left eyelid and I  think has a Horner syndrome.  This would suggest that she possibly had a  dorsal brainstem stroke involving the rostral medullary or ventral pons.  It is certainly possible that a basilar artery penetrator could be  opening and closing causing these symptoms.   The patient was admitted to the hospital when she was found to have  silent myocardial infarction and akinesis of her anteroseptal and  periapical walls that had worsened since September.  She had 2 stents  placed today and this went  quite well.   The patient denies any other neurologic symptoms including diplopia,  dysphagia, axial weakness, unsteadiness, loss of consciousness, or  incontinence.   I was asked to see her to determine the etiology of her dysfunction and  recommendations for further workup and treatment.  Unfortunately, with  placement of the stents, the principal tests that would have been  suggested i.e. MRI and MRA cannot be done until these stents  epithelialized.   Her only risk factor for stroke is hypertension.  She has been evaluated  for dyslipidemia, diabetes, and hyperhomocysteinemia, and these have  been negative both in September and for the lipids now.  The patient has  had no other significant medical problems.   CURRENT MEDICATIONS:  1. Aggrenox 25/200 one p.o. b.i.d.  2. Clonidine 0.2 mg 3 times daily.  3. Diovan.   HOME MEDICINES:  Currently, she has aspirin 325 mg, Plavix 75 mg, Coreg  6.25  mg, clonidine 0.2 mg 3 times daily, and Synthroid 25 mcg daily.   DRUG ALLERGIES:  None known.   Twelve-system review is negative except as noted above.   SOCIAL HISTORY:  The patient smoked a long time ago and since quit.  She  does not use alcohol.  Her husband died years ago.  She lives around the  corner from her son who provides at least oversight.  She is fairly  independent in all of her activities of daily living.   FAMILY HISTORY:  Negative for others with stroke.   PHYSICAL EXAMINATION:  GENERAL:  Today, pleasant woman in no acute  distress.  VITAL SIGNS:  Blood pressure 111/70, resting pulse 81, respirations 17,  and temperature 97.7.  HEAD, EYES, EARS, NOSE, AND THROAT:  No signs of infection.  No cranial  or cervical bruits.  LUNGS:  Clear to auscultation.  HEART:  No murmurs.  Pulses normal.  ABDOMEN:  Soft.  EXTREMITIES:  Well formed, but show significant bruising.  There is no  edema or cyanosis.  NEUROLOGIC:  Mental status:  Awake, alert, and kind of appropriate.   No  dysphasia or dyspraxia.  Names objects and follows commands, conveys  thoughts and feelings.  Cranial nerves:  The patient has left eyelid  ptosis and I believe left miosis suggesting a Horner syndrome.  Extraocular movements are full and conjugate.  Fundi are normal.  Symmetric facial strength, midline tongue, air conduction greater than  bone conduction.  She has no dysarthria.  Motor examination:  Normal  strength, tone and mass.  Good fine motor movements, no pronator drift.  Sensation intact to cold, vibration, stereognosis.  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  I was not able to test her right leg  because she has a sheath in place from her stenting procedure.  Reflexes  were generally diminished and I was not able to obtain them.  The  patient had bilateral flexor plantar responses.   Laboratories were reviewed.  Sodium 138, potassium 3.2, chloride 103,  CO2 25, BUN 36, creatinine 1.32, and glucose 115.  Hemoglobin 11.1,  hematocrit 32.1, white blood cell count 11,800, and platelet count  219,000.  CK elevated at 3096, CK-MB 15.3, troponin 2.84, and BNP 1035.  Triglyceride 68, cholesterol 129, HDL 54, and LDL 61.   I reviewed the CT scan of the brain and it shows an old left lateral  thalamic and posterior limb of the internal capsule lacune that is  stable since September.  The patient has mild diffuse central and  peripheral atrophy and mild diffuse white matter disease that is  confluent.   Carotid Dopplers in August 2009 showed 40-60% stenosis in the left  internal carotid artery.  A 2-D echocardiogram at this time shows 50-55%  ejection fraction, akinesis of the anterolateral and periapical walls  that is worse since September 2009, basal septal hypertrophy, mild  tricuspid regurgitation, and aortic valvular regurgitation.  There is  some stranding in the apical region, but she was not felt to have a  mural thrombus.  Indeed no source of emboli was  seen other than the  akinesis.   IMPRESSION:  1. Left Horner syndrome with eyelid ptosis and miosis. (337.9)  2. Recurrent left facial droop and dysarthria without dysphasia.  This      is consistent with vertebrobasilar insufficiency.  CT as described      above. (435.1)   RECOMMENDATIONS:  An MRI scan and MRA  intracranial should be done in  about 6 weeks time or longer as needed by Cardiology.  I would recommend  carotid Doppler at this time.  I do not think that the hemoglobin A1c or  homocystine need to be repeated.  She has adequate treatment with  antiplatelets not only for her brain but also for her stents.  There is  some increased risk of bleeding with a combination of aspirin and  Plavix, but over a short one, this is standard care after placement of a  stent.   I have no other suggestions at this time.  I appreciate the opportunity  to participate in her care.  If you have questions, do not hesitate to  contact me.  Her NIH stroke scale was zero.       Deanna Artis. Sharene Skeans, M.D.  Electronically Signed     WHH/MEDQ  D:  04/29/2008  T:  04/30/2008  Job:  [id number]   cc:   [name]. Excell Seltzer, MD

## 2010-07-11 NOTE — Assessment & Plan Note (Signed)
[facility]                                 ON-CALL NOTE   NAME:BRAUNAnysa, Tacey                           MRN:          [id number]  DATE:05/12/2008                            DOB:          Feb 27, 1920    PRIMARY CARDIOLOGIST:  Marca Ancona, MD   I received a call from Jamesetta Orleans at [phone] stating that his mother  who was recently hospitalized on our service, has developed a head cold  with sinus congestion.  He was just wondering whether or not he should  bring her into our office or the primary care.  I have recommended that  she should see her primary care Advaith Lamarque, Dr. Lovell Sheehan, and he is going  to take her to [facility] today for that visit.  He was grateful for the  call back.     Nicolasa Ducking, ANP  Electronically Signed    CB/MedQ  DD: 05/12/2008  DT: 05/12/2008  Job #: [id number]

## 2010-07-11 NOTE — H&P (Signed)
NAME:  [name], [name]                  ACCOUNT NO.:  [id number]   MEDICAL RECORD NO.:  [id number]          PATIENT TYPE:  INP   LOCATION:  3104                         FACILITY:  [facility]   PHYSICIAN:  Levert Feinstein, MD          DATE OF BIRTH:  11/30/1920   DATE OF ADMISSION:  06/25/2008  DATE OF DISCHARGE:                              HISTORY & PHYSICAL   CHIEF COMPLAINT:  Seizure.   HISTORY OF PRESENT ILLNESS:  The patient is a pleasant 77 year old  Caucasian female, son is at the bedside, who provides the history.   She has past medical history of coronary artery disease, status post  drug-coated stent placed in April 29, 2008.  He is taking aspirin and  Plavix at home, also had a past medical history of hypertension and  hypothyroidism.   This morning, when son called her, she did not pick up the phone as  usual, so he came to check on his mother at 7:10am.  When he entered the  room, the patient was fixing herself breakfast.  She had no difficulty  using her arms and legs.  When began to talk, son noticed she had  garbled speech.  This is not her initial episode.   Actually over the past few years, she has these episodes of difficulty  talking which usually last about 30 minutes, gradually resolves, none of  the episodes associated with body jerking or loss of consciousness.  She  had CAT scan-brain before, did not identified the etiology, was not  treated with any antiepileptic medications.  It happened about every 3  weeks.   Because of its frequent occurrence in the past, and she alway rebounce  back to normal in about .  Son decided to let her have breakfast  hoping she comes back to her baseline.  When she was eating her  breakfast, suddenly her head dropped then extended and then forced head  deviation to the right side, whole body tonic jerking episodes,  lasted  about 30-40 seconds.  Son called the ambulance and she gradually came  around during the process.  She had  another episode on her way to the  emergency room.  Had another one right before I entered the room around  8:40am.  The examination was performed after she received 2 mg of  Ativan.  The ER physician described right facial, arm, and leg  twitching, lasting about 45 seconds.   The stroke nurse Rida, evaluated the patient prior to the third seizure,  reported that she was awake, has difficulty following command, was not  able to answer month and age correctly, also noted to have right  hemianopia and right facial paralysis, but no limb muscle weakness.  The  language was described as nearly unintelligible.  Because the focal  deficits, code stroke was activated upon initial presentation.   REVIEW OF SYSTEMS:  Not obtainable.   PAST MEDICAL HISTORY:  1. Hypertension.  2. Hypothyroidism.  3. Coronary artery disease, status post stent on April 29, 2008.   PAST SURGICAL HISTORY:  As above.   SOCIAL HISTORY:  Husband of 74 years old passed away from leukemia on  11-30-07.  The patient is doing great and she only has 1 son  who lives close by.  Prior to admission, she was independent of living  and denies smoking or drinking.   FAMILY HISTORY:  Noncontributory.   HOME MEDICATIONS:  1. Plavix.  2. Aspirin.  3. Zocor 40 mg at bedtime.  4. Coreg 12.5 mg b.i.d.  5. Synthroid 0.025 mg every day.  6. Clonidine 0.1 mg b.i.d.  7. Amlodipine 5 mg every day.  8. Lisinopril 10 mg every day.  9. Lactulose as needed.  10.Nitrostat 0.5 as needed.   ALLERGIES:  No known drug allergies.   PHYSICAL EXAMINATION:  She has just received 2 mg of Ativan prior to  examination.  VITAL SIGNS:  Afebrile; blood pressure is 139/60 and upon admission, the  blood pressure was 188/53; heart rate of 80; and saturation is 99%.  CARDIAC:  Regular rate and rhythm.  PULMONARY:  Clear to auscultation bilaterally.  NECK:  Supple.  No carotid bruits.  NEUROLOGICAL:  She is drowsy, responsive to pain  only, and not talking.  She has right upper motor neuron seventh palsy and forceful eye  deviation to the left side.  Motor examination; was able to maintain  muscle tone and has some spontaneous activity on the left upper and  lower extremity but has loss of muscle tone on the right upper and lower  extremity and deep tendon reflex was brisk and symmetric bilaterally.  Plantar responses were flat extensor bilaterally.   LABORATORY EVALUATION:  Troponin was negative.  CBC with mildly elevated  white count of 10 and neutrophils 78%.  CMP has mildly elevated CK-MB  4.5.  Troponin was negative, otherwise normal.   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  1. 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  2. Treat her with Keppra 500 mg IV and then 500 b.i.d.  3. EEG.  4. MRI of the brain  5. Son worried about that patient lives alone, hope for assistant      living placement.      Levert Feinstein, MD  Electronically Signed     YY/MEDQ  D:  06/25/2008  T:  06/26/2008  Job:  [id number]

## 2010-07-11 NOTE — Assessment & Plan Note (Signed)
[facility]                                 ON-CALL NOTE   NAME:[name], [name]                         MRN:          [id number]  DATE:05/01/2008                            DOB:          October 24, 1920    PRIMARY CARDIOLOGIST:  Marca Ancona, MD   PROBLEM:  I was contacted by the patient's son a short while ago,  regarding her medications that she was discharged on.  She apparently  was not taking Synthroid prior to this admission, but was being treated  with it during her stay here at [facility].  He also noted a discrepancy  in the clonidine dose that she been taking, prior to this admission.   PLAN:  I reassured the patient's son that I would contact her pharmacy  and call in prescriptions for both Synthroid 0.025 mg daily, with one  refill, and clonidine 0.1 mg b.i.d., with 3 refills.      Rozell Searing, PA-C  Electronically Signed      Marca Ancona, MD  Electronically Signed   GS/MedQ  DD: 05/01/2008  DT: 05/02/2008  Job #: [id number]

## 2010-07-11 NOTE — Discharge Summary (Signed)
NAMEMarland Kitchen  [name], [name]                  ACCOUNT NO.:  [id number]   MEDICAL RECORD NO.:  [id number]          PATIENT TYPE:  INP   LOCATION:  3015                         FACILITY:  [facility]   PHYSICIAN:  Levert Feinstein, MD          DATE OF BIRTH:  1920/04/14   DATE OF ADMISSION:  06/25/2008  DATE OF DISCHARGE:  05/01/2008                               DISCHARGE SUMMARY   >   DISCHARGE DIAGNOSES:  Complex partial seizure, coronary artery disease,  hypertension, and hypothyroidism.   DISCHARGE MEDICATIONS:  1. Plavix 75 mg every day.  2. Aspirin 81 mg every day.  3. Zocor 40 mg every day.  4. Coreg 12.5 mg b.i.d.  5. Synthroid 0.025 mg every day.  6. Clonidine 0.1 mg b.i.d.  7. Amlodipine 5 mg daily.  8. Lisinopril 20 mg every day.  9. Keppra 250 mg b.i.d.  10.Lactulose as needed.  11.Nitrostat as needed.   HOSPITAL COURSE:  The patient is a pleasant 77 year old right-handed  Caucasian female with past medical history of coronary artery disease,  status post drug-coated stent placement in April 29, 2008, hypertension,  hypothyroidism.   Over the past few years, she is presenting with episode of difficulty  talking with garbled speech, which usually lasts about 30 minutes,  gradually resolves.  None of the episodes was associated with loss of  consciousness.  On the day of admission on June 25, 2008, her son  checked on her, found she developed the difficulty talking again.  When  she was sitting down having breakfast, her head dropped forward, then  extended, then forceful deviation to the right side with whole body  tonic clonic movement, lasts a few minutes, followed by postevent  confusion.  She has similar recurrence, total three episode in 2 hours  followed by mild right-side partial paralysis.  Initially, code stroke  was activated, but with her history of seizure, no TPA was given.   MRI of the brain was obtained, there was no acute lesion, there was mild  chronic small vessel  disease, she was started on Keppra, no recurrent  seizure, now she is back to her baseline, extensive discussion with her  son and the patient herself, decided to discharge to assisted living.   Upon discharge, the patient was found to have mild elevated blood  pressure in 170s over 90s, so increased her lisinopril from 10 to 20 mg  every day.   PHYSICAL EXAMINATION:  She was awake, alert, oriented x4, no aphasia,  mild gait difficulty with mild distal left ankle dorsiflexion weakness  4+/5, mild bilateral foot drop upon ambulating, used cane, also found to  have  hyperreflexia of upper extremity and patella, but absent Achilles  reflex.  Likely her gait difficulty is a combination of cervical plus  lumbar sacral spinal stenosis.   She is to discharge to her assisted living.  Return to [facility] in 1-2  months.      Levert Feinstein, MD  Electronically Signed     YY/MEDQ  D:  06/29/2008  T:  06/29/2008  Job:  [id number]

## 2010-07-11 NOTE — H&P (Signed)
NAMEMarland Kitchen  Robertson, Kathryn                  ACCOUNT NO.:  [id number]   MEDICAL RECORD NO.:  [id number]          PATIENT TYPE:  INP   LOCATION:  3032                         FACILITY:  [facility]   PHYSICIAN:  Georgina Quint. Plotnikov, MDDATE OF BIRTH:  12-27-1920   DATE OF ADMISSION:  11/03/2007  DATE OF DISCHARGE:                              HISTORY & PHYSICAL   CHIEF COMPLAINT:  Slurred speech and fall.   HISTORY OF PRESENT ILLNESS:  Obtained from the patient's son.  The  patient is an 77 year old female who presented to the emergency room  because of the episodes of slurred speech that lasted longer than usual.  She fell in the kitchen around 10 o'clock on the morning.  Apparently,  slid off the  chair and hit her head and twisted left foot.  There was  no loss of consciousness.  She continued on with her daily routine.  At  1:00 p.m., she developed an episode of slurred speech that did not  resolve as soon as it used to in the past.  The family brought her to  the emergency room.  There was no headache, nausea, or vomiting.  She  does have pain in the left foot and ankle.   PAST MEDICAL HISTORY:  History of TIAs with speech impairment, that she  has been having for years, every 3 weeks. They may last for 2-3 hours  and resolve.  No other neurologic symptoms go along with it.  No chest  pain or shortness of breath.  No headaches, no migraine type  equivalents.  She walks with a cane for long distance.  Hypertension and  osteoarthritis.   CURRENT MEDICATIONS:  Clonidine, multivitamin, Toprol XL, and Aggrenox,  dosages unknown.   ALLERGIES:  No known drug allergies.   SOCIAL HISTORY:  Does not smoke or drink.  She lives with her husband.   FAMILY HISTORY:  Positive for high blood pressure.   REVIEW OF SYSTEMS:  TIAs as described above every 3 weeks lasting for 2-  3 hours for years.  The rest of the 18-point review of systems is as  above or negative.  She walks with a cane.   PHYSICAL  EXAMINATION:  VITAL SIGNS: Vital signs are being taken and I  will review them.  GENERAL:  She is in no acute distress, looks comfortable.  She does have  a left facial droop which in fact turns out to be little ecchymosis of  the left upper lip.  She also has a bruise on the right forehead.  Her  left dorsal foot is swollen and bruised.  Ankle is slightly tender to  palpation.  She also had bruises over the manubrium of her sternum.  She  is alert, oriented, and cooperative, slightly dysarthric.  HEENT:  Moist mucosa.  NECK:  Supple.  No thyromegaly or bruit.  LUNGS:  Clear.  No wheezes or rales.  HEART:  S1 and S2.  No gallop.  Grade 2/6 systolic murmur.  ABDOMEN:  Soft and nontender.  No organomegaly and no mass felt.  LOWER EXTREMITIES:  Without  edema except for left ankle which is trace.  Pulses normal.  SKIN:  With aging changes and bruises as described above.   LABS:  White count 11.5, hemoglobin 12.3, and platelets 370.  Sodium  137, potassium 3.5, creatinine 1.7, and BUN 20.  LFTs normal.  CT of the  head without acute changes.  C-spine CT without fractures.  Of note,  thyroid hypodensities.  Foot x-ray with ulcer left foot of the dorsum  and swelling, no bony fractures.   ASSESSMENT AND PLAN:  1. Recurrent transient ischemic attacks every 3 weeks for years.  This      episode is a bit more severe.  She is on Aggrenox.  We can try to      add a little more aspirin to Aggrenox.  IV hydration.  Carotid      Doppler ultrasound.  2. Contusions of the face.  Left foot, we will use ice for 24 hours.  3. Hypertension.  Continue current therapy.  4. Deep vein thrombosis prophylaxis.  We will not use Lovenox, due to      her fall this morning.      Georgina Quint. Plotnikov, MD  Electronically Signed     AVP/MEDQ  D:  11/03/2007  T:  11/04/2007  Job:  [id number]

## 2010-07-11 NOTE — Discharge Summary (Signed)
NAME:  [name], [name]                  ACCOUNT NO.:  [id number]   MEDICAL RECORD NO.:  [id number]          PATIENT TYPE:  INP   LOCATION:  3032                         FACILITY:  [facility]   PHYSICIAN:  Raenette Rover. Felicity Coyer, MDDATE OF BIRTH:  1920-07-03   DATE OF ADMISSION:  11/03/2007  DATE OF DISCHARGE:  11/04/2007                               DISCHARGE SUMMARY   DIAGNOSES AT TIME OF DISCHARGE:  1. Status post fall in setting of recurrent transient ischemic attack.  2. Hypothyroid with abnormal imaging on CT.  Will need outpatient      thyroid ultrasound as well as followup thyroid function test in 4-6      weeks, started on Synthroid.  3. Hypertension.  4. Hypokalemia.   HISTORY OF PRESENT ILLNESS:  [name] is an 77 year old female who was  admitted on November 03, 2007, due to slurred speech which had been  present since 1 p.m. on the day of admission.  She apparently fell at 10  a.m. in the kitchen striking her head as well as her back.  The  patient's family seem to think that the fall was more of a mechanical  fall that was then followed by TIA like symptoms.   COURSE OF HOSPITALIZATION:  Problem #1.  Status post fall in setting of  recurrent TIA.  The patient was admitted.  She did have CT done of her C-  spine, which showed multiple thyroid hypodensities, and recommended  dedicated thyroid ultrasound, there was no acute cervical spine  fracture.  CT head showed no acute abnormalities.  She did also have an  x-ray of the left foot, which noted soft ulceration of the dorsum of the  foot without osseous abnormalities.  She was on Aggrenox prior to  admission.  This will be continued at time of discharge.  She underwent  carotid duplex during this admission, which noted 40%-60% ICA stenosis  in the left carotid ICA, no significant right ICA stenosis was noted.  A  2D echo was also performed and we are awaiting read of this study prior  to discharge.  If negative, we anticipate  discharge to home later on  this evening.  If the patient does go home this evening, she will need  homocystine, fasting lipid profile, and hemoglobin A1c performed as an  outpatient.  If she stays overnight, we have these tests planned for the  morning.  Problem #2.  Hypothyroid.  This is a new diagnosis.  The patient's TSH  was noted to be 6.107.  She was started on Synthroid 25 mcg p.o. daily,  which will be continued at time of discharge.   DISPOSITION:  The patient will be discharged to home.   MEDICATIONS AT TIME OF DISCHARGE:  1. Aggrenox 1 cap p.o. b.i.d.  2. Diovan HCT 320/25 1 tab p.o. daily.  3. Clonidine 0.2 mg p.o. t.i.d.  4. Os-Cal plus D 500 mg p.o. daily.  5. Multivitamin 1 tablet p.o. daily.  6. Stool softener at bedtime as before.  7. Ambien 5 mg at bedtime as needed.  8. Synthroid  25 mcg p.o. daily.   PERTINENT LABORATORY DATA:  At the time of discharge, TSH 6.107, B12  1268, ESR 53, hemoglobin 12.3, hematocrit 36.3, BUN 15, creatinine 1.43,  potassium 3.3 prior to repletion.  AST, ALT normal.   FOLLOWUP:  She is scheduled to follow up with Dr. Darryll Capers on  Tuesday, November 18, 2007, at 3:30 p.m.  She is instructed to return  to the ER, should she develop weakness or slurred speech.  Greater than  30 minutes were spent on discharge planning.      Sandford Craze, NP      Raenette Rover. Felicity Coyer, MD  Electronically Signed    MO/MEDQ  D:  11/04/2007  T:  11/05/2007  Job:  [id number]   cc:   Stacie Glaze, MD  Raenette Rover Felicity Coyer, MD

## 2010-07-17 ENCOUNTER — Other Ambulatory Visit: Payer: Self-pay | Admitting: Cardiology

## 2010-07-20 NOTE — Telephone Encounter (Signed)
Pt refill requested 5-21 and no response , she is running out and needs asap

## 2010-07-26 ENCOUNTER — Other Ambulatory Visit: Payer: Self-pay | Admitting: Internal Medicine

## 2010-08-01 ENCOUNTER — Other Ambulatory Visit: Payer: Self-pay | Admitting: Internal Medicine

## 2010-08-08 ENCOUNTER — Telehealth: Payer: Self-pay | Admitting: *Deleted

## 2010-08-08 NOTE — Telephone Encounter (Signed)
Pt. Would like Dr. Lovell Sheehan to  call him after his Mom's appt June 22, please.

## 2010-08-09 ENCOUNTER — Ambulatory Visit: Payer: Medicare Other | Admitting: Internal Medicine

## 2010-08-12 ENCOUNTER — Other Ambulatory Visit: Payer: Self-pay | Admitting: Internal Medicine

## 2010-08-13 ENCOUNTER — Other Ambulatory Visit: Payer: Self-pay | Admitting: Internal Medicine

## 2010-08-18 ENCOUNTER — Ambulatory Visit (INDEPENDENT_AMBULATORY_CARE_PROVIDER_SITE_OTHER): Payer: Medicare Other | Admitting: Internal Medicine

## 2010-08-18 ENCOUNTER — Encounter: Payer: Self-pay | Admitting: Internal Medicine

## 2010-08-18 DIAGNOSIS — I10 Essential (primary) hypertension: Secondary | ICD-10-CM

## 2010-08-18 NOTE — Progress Notes (Signed)
  Subjective:    Patient ID: [name], female    DOB: Nov 26, 1920, 77 y.o.   MRN: [id number]  HPI  Two acute complaints: contusion of elbow and bleeding  Memory issues  Review of Systems  Constitutional: Negative for activity change, appetite change and fatigue.  HENT: Negative for ear pain, congestion, neck pain, postnasal drip and sinus pressure.   Eyes: Negative for redness and visual disturbance.  Respiratory: Negative for cough, shortness of breath and wheezing.   Gastrointestinal: Negative for abdominal pain and abdominal distention.  Genitourinary: Negative for dysuria, frequency and menstrual problem.  Musculoskeletal: Negative for myalgias, joint swelling and arthralgias.  Skin: Negative for rash and wound.  Neurological: Negative for dizziness, weakness and headaches.  Hematological: Negative for adenopathy. Does not bruise/bleed easily.  Psychiatric/Behavioral: Negative for sleep disturbance and decreased concentration.   Past Medical History  Diagnosis Date  . Hypertension   . Coronary artery disease     NSTEMI 3/10. LHC showed 80% mLAD, 90% mCFX. She had PCI with Endeavor 2.5 x 12 to LAD and Endeavor 3.0 x 12 to CFX  . CHF (congestive heart failure)     Diastolic CHF. Echo (3/10) EF 45-50% with periapical akinesis. Mild LVH. Mild MR. Pseudonormal diastolic function. Echo (4/10): EF 65% with mild focal basal septal hypertrophy. Pseudonormal diastolic function. Normal wall motion. Moderate biatrial enlargement. PASP 61 mmHg. Echo (4/11): EF 55%, inferobasal hypokinesis, mild MR, PA systolic pressure 44 mmHg.  . Frequent falls   . Lacunar stroke     on CT  . Expressive aphasia syndrome     More likely atypical migraine than TIA. Carotid dopplers (8/09) with 40-60% LICA stenosis.  . Horner's syndrome     left  . SVT (supraventricular tachycardia)     transient  . Hypothyroidism    No past surgical history on file.  reports that she quit smoking about 60 years ago.  Her smoking use included Cigarettes. She has never used smokeless tobacco. She reports that she does not drink alcohol or use illicit drugs. family history includes COPD in her father and Hypertension in her mother. No Known Allergies      Objective:   Physical Exam  Constitutional: She is oriented to person, place, and time. She appears well-developed and well-nourished. No distress.  HENT:  Head: Normocephalic and atraumatic.  Right Ear: External ear normal.  Left Ear: External ear normal.  Nose: Nose normal.  Mouth/Throat: Oropharynx is clear and moist.  Eyes: Conjunctivae and EOM are normal. Pupils are equal, round, and reactive to light.  Neck: Normal range of motion. Neck supple. No JVD present. No tracheal deviation present. No thyromegaly present.  Cardiovascular: Normal rate, regular rhythm, normal heart sounds and intact distal pulses.   No murmur heard. Pulmonary/Chest: Effort normal and breath sounds normal. She has no wheezes. She exhibits no tenderness.  Abdominal: Soft. Bowel sounds are normal.  Musculoskeletal: Normal range of motion. She exhibits no edema and no tenderness.  Lymphadenopathy:    She has no cervical adenopathy.  Neurological: She is alert and oriented to person, place, and time. She has normal reflexes. No cranial nerve deficit.  Skin: Skin is warm and dry. She is not diaphoretic.  Psychiatric: She has a normal mood and affect. Her behavior is normal.          Assessment & Plan:  Needs to exercise Great lipids Will not recognize depression

## 2010-08-29 ENCOUNTER — Other Ambulatory Visit: Payer: Self-pay | Admitting: Cardiology

## 2010-09-11 ENCOUNTER — Other Ambulatory Visit: Payer: Self-pay | Admitting: Cardiology

## 2010-09-13 ENCOUNTER — Other Ambulatory Visit: Payer: Self-pay | Admitting: Internal Medicine

## 2010-09-13 DIAGNOSIS — Z1231 Encounter for screening mammogram for malignant neoplasm of breast: Secondary | ICD-10-CM

## 2010-10-05 ENCOUNTER — Other Ambulatory Visit: Payer: Self-pay | Admitting: Internal Medicine

## 2010-10-08 ENCOUNTER — Other Ambulatory Visit: Payer: Self-pay | Admitting: Internal Medicine

## 2010-10-13 ENCOUNTER — Ambulatory Visit
Admission: RE | Admit: 2010-10-13 | Discharge: 2010-10-13 | Disposition: A | Discharge status: Home / Self Care | Payer: Medicare Other | Source: Ambulatory Visit | Attending: Internal Medicine | Admitting: Internal Medicine

## 2010-10-13 DIAGNOSIS — Z1231 Encounter for screening mammogram for malignant neoplasm of breast: Secondary | ICD-10-CM

## 2010-10-25 ENCOUNTER — Other Ambulatory Visit: Payer: Self-pay | Admitting: Cardiology

## 2010-10-25 ENCOUNTER — Other Ambulatory Visit: Payer: Self-pay | Admitting: Internal Medicine

## 2010-11-20 ENCOUNTER — Ambulatory Visit (INDEPENDENT_AMBULATORY_CARE_PROVIDER_SITE_OTHER): Payer: Medicare Other | Admitting: Internal Medicine

## 2010-11-20 ENCOUNTER — Encounter: Payer: Self-pay | Admitting: Internal Medicine

## 2010-11-20 VITALS — BP 130/70 | HR 76 | Temp 98.2°F | Resp 16 | Ht <= 58 in | Wt 143.0 lb

## 2010-11-20 DIAGNOSIS — M1990 Unspecified osteoarthritis, unspecified site: Secondary | ICD-10-CM

## 2010-11-20 DIAGNOSIS — I10 Essential (primary) hypertension: Secondary | ICD-10-CM

## 2010-11-20 DIAGNOSIS — M129 Arthropathy, unspecified: Secondary | ICD-10-CM

## 2010-11-20 DIAGNOSIS — F329 Major depressive disorder, single episode, unspecified: Secondary | ICD-10-CM

## 2010-11-20 NOTE — Progress Notes (Signed)
Subjective:    Patient ID: [name], female    DOB: 15-Dec-1920, 77 y.o.   MRN: [id number]  HPI  Patient is an elderly 77 year old white female who presents for followup of hypertension hypothyroidism hyperlipidemia and recently diagnosed with depression after her last visit she was convinced to begin low-dose Zoloft and has had a remarkable effect for her son states that she has much less anxious and easier to get along with she herself recognizes that her mood has improved.  Her blood pressure stable hypothyroidism is monitored hyperlipidemia is treated with Zocor.  In review of her medications she is on amlodipine carvedilol and clonidine as well as given for blood pressure control with Lasix for edema control.  She is chronic constipation she has a history of seizure disorder on Keppra.  Generally she is doing well respiratory status is stable    Review of Systems  Constitutional: Negative for activity change, appetite change and fatigue.  HENT: Negative for ear pain, congestion, neck pain, postnasal drip and sinus pressure.   Eyes: Negative for redness and visual disturbance.  Respiratory: Negative for cough, shortness of breath and wheezing.   Gastrointestinal: Negative for abdominal pain and abdominal distention.  Genitourinary: Negative for dysuria, frequency and menstrual problem.  Musculoskeletal: Positive for myalgias, back pain, joint swelling and gait problem. Negative for arthralgias.  Skin: Negative for rash and wound.  Neurological: Negative for dizziness, weakness and headaches.  Hematological: Negative for adenopathy. Does not bruise/bleed easily.  Psychiatric/Behavioral: Negative for sleep disturbance and decreased concentration.   Past Medical History  Diagnosis Date  . Hypertension   . Coronary artery disease     NSTEMI 3/10. LHC showed 80% mLAD, 90% mCFX. She had PCI with Endeavor 2.5 x 12 to LAD and Endeavor 3.0 x 12 to CFX  . CHF (congestive heart  failure)     Diastolic CHF. Echo (3/10) EF 45-50% with periapical akinesis. Mild LVH. Mild MR. Pseudonormal diastolic function. Echo (4/10): EF 65% with mild focal basal septal hypertrophy. Pseudonormal diastolic function. Normal wall motion. Moderate biatrial enlargement. PASP 61 mmHg. Echo (4/11): EF 55%, inferobasal hypokinesis, mild MR, PA systolic pressure 44 mmHg.  . Frequent falls   . Lacunar stroke     on CT  . Expressive aphasia syndrome     More likely atypical migraine than TIA. Carotid dopplers (8/09) with 40-60% LICA stenosis.  . Horner's syndrome     left  . SVT (supraventricular tachycardia)     transient  . Hypothyroidism    No past surgical history on file.  reports that she quit smoking about 60 years ago. Her smoking use included Cigarettes. She has never used smokeless tobacco. She reports that she does not drink alcohol or use illicit drugs. family history includes COPD in her father and Hypertension in her mother. No Known Allergies     Objective:   Physical Exam  Nursing note and vitals reviewed. Constitutional: She is oriented to person, place, and time. She appears well-developed and well-nourished. No distress.  HENT:  Head: Normocephalic and atraumatic.  Right Ear: External ear normal.  Left Ear: External ear normal.  Nose: Nose normal.  Mouth/Throat: Oropharynx is clear and moist.  Eyes: Conjunctivae and EOM are normal. Pupils are equal, round, and reactive to light.  Neck: Normal range of motion. Neck supple. No JVD present. No tracheal deviation present. No thyromegaly present.  Cardiovascular: Normal rate, regular rhythm, normal heart sounds and intact distal pulses.   No murmur heard.  Pulmonary/Chest: Effort normal and breath sounds normal. She has no wheezes. She exhibits no tenderness.  Abdominal: Soft. Bowel sounds are normal.  Musculoskeletal: Normal range of motion. She exhibits edema and tenderness.  Lymphadenopathy:    She has no cervical  adenopathy.  Neurological: She is alert and oriented to person, place, and time. She has normal reflexes. No cranial nerve deficit.  Skin: Skin is warm and dry. She is not diaphoretic.  Psychiatric: She has a normal mood and affect. Her behavior is normal.          Assessment & Plan:  Post TIA CVA seizure disorder stable on Keppra without any symptomatology mood disorder treated with Zoloft blood pressure stable on multiple drug regimen and lipids treated with Zocor 40 mg.  Continue the Zoloft at the current dose followup in 3 months

## 2010-11-29 LAB — POCT I-STAT, CHEM 8
BUN: 20
Calcium, Ion: 1.2
Chloride: 104
Creatinine, Ser: 1.7 — ABNORMAL HIGH
Glucose, Bld: 142 — ABNORMAL HIGH
Potassium: 3.5

## 2010-11-29 LAB — CBC
Hemoglobin: 12.3
MCHC: 33.8
MCV: 99.4
RBC: 3.65 — ABNORMAL LOW
WBC: 11.5 — ABNORMAL HIGH

## 2010-11-29 LAB — LIPID PANEL
Cholesterol: 143
HDL: 49
LDL Cholesterol: 72
Total CHOL/HDL Ratio: 2.9

## 2010-11-29 LAB — BASIC METABOLIC PANEL
BUN: 9
CO2: 29
Chloride: 101
Chloride: 104
Creatinine, Ser: 1.43 — ABNORMAL HIGH
GFR calc Af Amer: 42 — ABNORMAL LOW
Glucose, Bld: 129 — ABNORMAL HIGH
Potassium: 3.2 — ABNORMAL LOW
Potassium: 3.3 — ABNORMAL LOW
Sodium: 137

## 2010-11-29 LAB — HEPATIC FUNCTION PANEL
AST: 33
Albumin: 3.4 — ABNORMAL LOW
Alkaline Phosphatase: 62
Bilirubin, Direct: 0.1
Total Bilirubin: 0.8

## 2010-11-29 LAB — DIFFERENTIAL
Basophils Relative: 0
Lymphs Abs: 1.1
Monocytes Absolute: 0.5
Monocytes Relative: 5
Neutro Abs: 9.9 — ABNORMAL HIGH
Neutrophils Relative %: 86 — ABNORMAL HIGH

## 2010-11-29 LAB — HEMOGLOBIN A1C: Hgb A1c MFr Bld: 5.6

## 2010-11-29 LAB — TSH: TSH: 6.107 — ABNORMAL HIGH

## 2010-12-05 ENCOUNTER — Other Ambulatory Visit: Payer: Self-pay | Admitting: Internal Medicine

## 2010-12-12 ENCOUNTER — Ambulatory Visit (INDEPENDENT_AMBULATORY_CARE_PROVIDER_SITE_OTHER): Payer: Medicare Other | Admitting: Cardiology

## 2010-12-12 ENCOUNTER — Encounter: Payer: Self-pay | Admitting: Cardiology

## 2010-12-12 DIAGNOSIS — E785 Hyperlipidemia, unspecified: Secondary | ICD-10-CM

## 2010-12-12 DIAGNOSIS — I10 Essential (primary) hypertension: Secondary | ICD-10-CM

## 2010-12-12 DIAGNOSIS — I5032 Chronic diastolic (congestive) heart failure: Secondary | ICD-10-CM

## 2010-12-12 DIAGNOSIS — I509 Heart failure, unspecified: Secondary | ICD-10-CM

## 2010-12-12 DIAGNOSIS — I2510 Atherosclerotic heart disease of native coronary artery without angina pectoris: Secondary | ICD-10-CM

## 2010-12-12 DIAGNOSIS — Z8679 Personal history of other diseases of the circulatory system: Secondary | ICD-10-CM

## 2010-12-12 NOTE — Patient Instructions (Signed)
Your physician recommends that you have  lab work today--BMP 414.01   Your physician has requested that you have a carotid duplex. This test is an ultrasound of the carotid arteries in your neck. It looks at blood flow through these arteries that supply the brain with blood. Allow one hour for this exam. There are no restrictions or special instructions.  Your physician wants you to follow-up in: 6 months with Dr Shirlee Latch. (April 2013) You will receive a reminder letter in the mail two months in advance. If you don't receive a letter, please call our office to schedule the follow-up appointment.

## 2010-12-13 LAB — BASIC METABOLIC PANEL
BUN: 38 mg/dL — ABNORMAL HIGH (ref 6–23)
Chloride: 103 mEq/L (ref 96–112)
Creatinine, Ser: 1.7 mg/dL — ABNORMAL HIGH (ref 0.4–1.2)
Glucose, Bld: 94 mg/dL (ref 70–99)
Sodium: 139 mEq/L (ref 135–145)

## 2010-12-13 NOTE — Assessment & Plan Note (Signed)
No ischemic symptoms.  Continue ASA, Coreg, ARB, and statin.

## 2010-12-13 NOTE — Assessment & Plan Note (Signed)
Volume appears stable on current dose of Lasix.  NYHA class II.  Will get BMET today.

## 2010-12-13 NOTE — Assessment & Plan Note (Signed)
BP reasonably controlled on current regimen.

## 2010-12-13 NOTE — Progress Notes (Signed)
PCP: Dr. Lovell Sheehan  77 yo with h/o CAD and TIAs vs atypical migraines presents for followup.  She is living at home by herself though her son lives around the corner.  She is able to walk around her house and to the mailbox without exertional dyspnea.  She blows leaves in her driveway. She uses a recumbent bike in her house for exercise.  She has not had any chest pain.  Last echo showed preserved LV systolic function.  She has had no recent neurologic episodes (TIA vs atypical migraine).  No orthopnea.  Weight is down 5 lbs.  No lightheadedness with standing.  BP is under reasonable control.  ECG: NSR, normal  Labs (8/10): K 4.2, creatinine 1.3 Labs (1/11): LDL 36, HDL 63, TSH normal Labs (9/11): K 4.6, creatinine 1.3 Labs (4/12): LDL 43, HDL 57  Allergies (verified):  1)  ! * Clonidine Patch Glue  Past Medical History: 1.  HTN 2.  CAD:  NSTEMI 3/10.  LHC showed 80% mLAD, 90% mCFX.  She had PCI with Endeavor 2.5 x 12 to LAD and Endeavor 3.0 x 12 to CFX.   3.  Diastolic CHF:  Echo (3/10) EF 40-98% with periapical akinesis.  Mild LVH.  Mild MR.  Pseudonormal diastolic function.  Echo (4/10): EF 65% with mild focal basal septal hypertrophy.  Pseudonormal diastolic function.  Normal wall motion.  Moderate biatrial enlargement.  PASP 61 mmHg.  Echo (4/11): EF 55%, inferobasal hypokinesis, mild MR, PA systolic pressure 44 mmHg.  4.  Frequent falls 5.  Lacunar stroke on CT 6.  Expressive aphasia episodes:  More likely atypical migraine than TIA.  Carotid dopplers (8/'09) with 40-60% LICA stenosis.  7.  Left Horners syndrome.  8.  Transient SVT 9.  Hypothyroidism.   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  Lives alone but son close by.  Alcohol use-no Drug use-no Regular exercise-no  ROS: All systems reviewed and negative except as per HPI.   Current Outpatient  Prescriptions  Medication Sig Dispense Refill  . amLODipine (NORVASC) 10 MG tablet TAKE 1 TABLET BY MOUTH EVERY DAY  30 tablet  5  . aspirin 81 MG tablet Take 162 mg by mouth daily.       . Calcium Carb-Cholecalciferol (OS-CAL 500 + D) 500-600 MG-UNIT TABS Take 1 tablet by mouth daily.        . carvedilol (COREG) 12.5 MG tablet TAKE 1 TABLET TWICE A DAY  60 tablet  4  . cloNIDine (CATAPRES) 0.1 MG tablet TAKE 1 TABLET 3 TIMES A DAY BY MOUTH  90 tablet  1  . DIOVAN 160 MG tablet TAKE 1 TABLET TWICE A DAY  60 tablet  5  . furosemide (LASIX) 20 MG tablet TAKE 1 TABLET EVERY OTHER DAY  30 tablet  3  . lactulose (CHRONULAC) 10 GM/15ML solution TAKE 15 ML BY MOUTH DAILY FOR BOWELS,INCREASE TO 30 ML IF CONSTIPATED  300 mL  4  . levETIRAcetam (KEPPRA) 250 MG tablet Take 250 mg by mouth 2 (two) times daily.        [name] levothyroxine (SYNTHROID, LEVOTHROID) 25 MCG tablet TAKE 1 TABLET EVERY DAY  30 tablet  5  . nitroGLYCERIN (NITROSTAT) 0.4 MG SL tablet Place 0.4 mg under the tongue every 5 (five) minutes as needed.        . sertraline (ZOLOFT) 25 MG tablet  TAKE 1 TABLET BY MOUTH EVERY DAY  30 tablet  2  . simvastatin (ZOCOR) 40 MG tablet TAKE 1 TABLET BY MOUTH BEFORE BED  30 tablet  5  . Sod Fluoride-Potassium Nitrate (FLUORIDEX SENSITIVITY RELIEF) 1.1-5 % GEL Place onto teeth as directed.        . Vitamin D, Ergocalciferol, (DRISDOL) 50000 UNITS CAPS TAKE 1 CAPSULE BY MOUTH WEEKLY  30 capsule  1    BP 138/70  Pulse 62  Ht 4\' 9"  (1.448 m)  Wt 139 lb 12.8 oz (63.413 kg)  BMI 30.25 kg/m2 General:  Elderly female, no apparent distress.  Neck:  Neck supple, no JVD. No masses, thyromegaly or abnormal cervical nodes. Lungs:  Clear bilaterally to auscultation and percussion. Heart:  Non-displaced PMI, chest non-tender; regular rate and rhythm, S1, S2 without rubs or gallops. 2/6 early systolic ejection murmur RUSB.  Carotid upstroke normal, no bruit.  Trace ankle edema.  Abdomen:  Bowel sounds positive;  abdomen soft and non-tender without masses, organomegaly, or hernias noted. No hepatosplenomegaly. Extremities:  No clubbing or cyanosis. Neurologic:  Alert and oriented x 3. Psych:  Normal affect.

## 2010-12-14 NOTE — Assessment & Plan Note (Signed)
Mild to moderate carotid stenosis.  Patient is due for repeat carotid dopplers, will order.

## 2010-12-14 NOTE — Assessment & Plan Note (Signed)
Lipids have been at goal on statin.

## 2011-01-02 ENCOUNTER — Other Ambulatory Visit: Payer: Self-pay | Admitting: *Deleted

## 2011-01-02 ENCOUNTER — Other Ambulatory Visit: Payer: Self-pay | Admitting: Cardiology

## 2011-01-02 MED ORDER — AMLODIPINE BESYLATE 10 MG PO TABS: 10.0000 mg | ORAL_TABLET | Freq: Every day | ORAL | Status: DC

## 2011-01-03 ENCOUNTER — Encounter (INDEPENDENT_AMBULATORY_CARE_PROVIDER_SITE_OTHER): Payer: Medicare Other | Admitting: Cardiology

## 2011-01-03 DIAGNOSIS — I6529 Occlusion and stenosis of unspecified carotid artery: Secondary | ICD-10-CM

## 2011-01-03 DIAGNOSIS — I2510 Atherosclerotic heart disease of native coronary artery without angina pectoris: Secondary | ICD-10-CM

## 2011-01-03 DIAGNOSIS — I5032 Chronic diastolic (congestive) heart failure: Secondary | ICD-10-CM

## 2011-01-09 NOTE — Telephone Encounter (Signed)
New problem  Per pt son calling. Should they keep track of patient weight.

## 2011-01-09 NOTE — Telephone Encounter (Signed)
Dr Shirlee Latch reviewed weights brought into office 12/15/10-01/02/11. No new recommendations. I discussed with Richard.  Pt did not need any medication refills.

## 2011-01-20 ENCOUNTER — Other Ambulatory Visit: Payer: Self-pay | Admitting: Internal Medicine

## 2011-01-22 ENCOUNTER — Other Ambulatory Visit: Payer: Self-pay | Admitting: Internal Medicine

## 2011-01-28 ENCOUNTER — Other Ambulatory Visit: Payer: Self-pay | Admitting: Internal Medicine

## 2011-02-11 ENCOUNTER — Other Ambulatory Visit: Payer: Self-pay | Admitting: Cardiology

## 2011-03-17 ENCOUNTER — Other Ambulatory Visit: Payer: Self-pay | Admitting: Cardiology

## 2011-03-19 ENCOUNTER — Encounter: Payer: Self-pay | Admitting: Internal Medicine

## 2011-03-19 ENCOUNTER — Ambulatory Visit (INDEPENDENT_AMBULATORY_CARE_PROVIDER_SITE_OTHER): Payer: Medicare Other | Admitting: Internal Medicine

## 2011-03-19 VITALS — BP 160/80 | HR 76 | Temp 98.3°F | Resp 16 | Ht <= 58 in | Wt 142.0 lb

## 2011-03-19 DIAGNOSIS — I10 Essential (primary) hypertension: Secondary | ICD-10-CM

## 2011-03-19 DIAGNOSIS — N289 Disorder of kidney and ureter, unspecified: Secondary | ICD-10-CM | POA: Diagnosis not present

## 2011-03-19 DIAGNOSIS — I509 Heart failure, unspecified: Secondary | ICD-10-CM | POA: Diagnosis not present

## 2011-03-19 NOTE — Patient Instructions (Addendum)
The patient is instructed to continue all medications as prescribed. Schedule followup with check out clerk upon leaving the clinic   I agree with stopping the furosemide you have no signs that you have any extra fluid onboard your lungs are clear and her heart sounds are good no blood pressure is stable continue to watch her weight and she gained 3 pounds in a day you should take a Lasix tablet that day... If this starts happening she will call me for a prescription for the Lasix for as needed use only

## 2011-03-19 NOTE — Progress Notes (Signed)
Subjective:    Patient ID: [name], female    DOB: 1920/06/27, 78 y.o.   MRN: [id number]  HPI Patient is a 78 year old white female who is followed for hypertension mild volume overload/acute on chronic CHF hypothyroidism mild to moderate depression.  She was recently seen by cardiology in the diuretic was stopped due to an increase in creatinine.  She has not noticed any weight gain since then her blood pressures been stable she has had no falls or any orthostatic hypotension she is doing well off the diuretic with no signs of congestive heart failure at this time.  Her weight remains stable and she and her son are monitoring her weight carefully   Review of Systems  Constitutional: Positive for fatigue. Negative for activity change and appetite change.  HENT: Positive for rhinorrhea and postnasal drip. Negative for ear pain, congestion, neck pain and sinus pressure.   Eyes: Negative for redness and visual disturbance.  Respiratory: Negative for cough, shortness of breath and wheezing.   Gastrointestinal: Negative for abdominal pain and abdominal distention.  Genitourinary: Positive for urgency and frequency. Negative for dysuria and menstrual problem.  Musculoskeletal: Positive for myalgias, back pain and gait problem. Negative for joint swelling and arthralgias.  Skin: Negative for rash and wound.  Neurological: Positive for weakness. Negative for dizziness and headaches.  Hematological: Negative for adenopathy. Does not bruise/bleed easily.  Psychiatric/Behavioral: Negative for sleep disturbance and decreased concentration.   Past Medical History  Diagnosis Date  . Hypertension   . Coronary artery disease     NSTEMI 3/10. LHC showed 80% mLAD, 90% mCFX. She had PCI with Endeavor 2.5 x 12 to LAD and Endeavor 3.0 x 12 to CFX  . CHF (congestive heart failure)     Diastolic CHF. Echo (3/10) EF 45-50% with periapical akinesis. Mild LVH. Mild MR. Pseudonormal diastolic function.  Echo (4/10): EF 65% with mild focal basal septal hypertrophy. Pseudonormal diastolic function. Normal wall motion. Moderate biatrial enlargement. PASP 61 mmHg. Echo (4/11): EF 55%, inferobasal hypokinesis, mild MR, PA systolic pressure 44 mmHg.  . Frequent falls   . Lacunar stroke     on CT  . Expressive aphasia syndrome     More likely atypical migraine than TIA. Carotid dopplers (8/09) with 40-60% LICA stenosis.  . Horner's syndrome     left  . SVT (supraventricular tachycardia)     transient  . Hypothyroidism     History   Social History  . Marital Status: Widowed    Spouse Name: N/A    Number of Children: N/A  . Years of Education: N/A   Occupational History  . Not on file.   Social History Main Topics  . Smoking status: Former Smoker    Types: Cigarettes    Quit date: 05/25/1950  . Smokeless tobacco: Never Used  . Alcohol Use: No  . Drug Use: No  . Sexually Active: Yes   Other Topics Concern  . Not on file   Social History Narrative  . No narrative on file    No past surgical history on file.  Family History  Problem Relation Age of Onset  . Hypertension Mother   . COPD Father     No Known Allergies  Current Outpatient Prescriptions on File Prior to Visit  Medication Sig Dispense Refill  . amLODipine (NORVASC) 10 MG tablet Take 1 tablet (10 mg total) by mouth daily.  30 tablet  7  . aspirin 81 MG tablet Take 162 mg by  mouth daily.       . carvedilol (COREG) 12.5 MG tablet TAKE 1 TABLET TWICE A DAY  60 tablet  4  . cloNIDine (CATAPRES) 0.1 MG tablet TAKE 1 TABLET 3 TIMES A DAY BY MOUTH  90 tablet  1  . DIOVAN 160 MG tablet TAKE 1 TABLET TWICE A DAY  60 tablet  5  . furosemide (LASIX) 20 MG tablet TAKE 1 TABLET EVERY OTHER DAY  30 tablet  3  . lactulose (CHRONULAC) 10 GM/15ML solution TAKE 15 ML BY MOUTH DAILY FOR BOWELS,INCREASE TO 30 ML IF CONSTIPATED  300 mL  4  . levETIRAcetam (KEPPRA) 250 MG tablet Take 250 mg by mouth 2 (two) times daily.        [name]  levothyroxine (SYNTHROID, LEVOTHROID) 25 MCG tablet TAKE 1 TABLET EVERY DAY  30 tablet  5  . nitroGLYCERIN (NITROSTAT) 0.4 MG SL tablet Place 0.4 mg under the tongue every 5 (five) minutes as needed.        . sertraline (ZOLOFT) 25 MG tablet TAKE 1 TABLET BY MOUTH EVERY DAY  30 tablet  2  . simvastatin (ZOCOR) 40 MG tablet TAKE 1 TABLET BY MOUTH BEFORE BED  30 tablet  5  . Sod Fluoride-Potassium Nitrate (FLUORIDEX SENSITIVITY RELIEF) 1.1-5 % GEL Place onto teeth as directed.        . Vitamin D, Ergocalciferol, (DRISDOL) 50000 UNITS CAPS TAKE 1 CAPSULE BY MOUTH WEEKLY  30 capsule  1    BP 160/80  Pulse 76  Temp 98.3 F (36.8 C)  Resp 16  Ht 4\' 9"  (1.448 m)  Wt 142 lb (64.411 kg)  BMI 30.73 kg/m2        Objective:   Physical Exam  Nursing note and vitals reviewed. Constitutional: She is oriented to person, place, and time. She appears well-nourished.  HENT:  Head: Normocephalic and atraumatic.  Eyes: Conjunctivae are normal. Pupils are equal, round, and reactive to light.  Neck: No JVD present. No tracheal deviation present. No thyromegaly present.  Cardiovascular: Normal rate and regular rhythm.   Abdominal: Soft. Bowel sounds are normal.  Musculoskeletal: She exhibits edema and tenderness.  Neurological: She is alert and oriented to person, place, and time.  Skin: Skin is warm and dry.          Assessment & Plan:  Hypertension stable on current medications no evidence of increased lower extremity edema of the diuretic.  We discussed monitoring her weight on a daily basis if she gains more than 3 pounds in the one particular day that she will take the Lasix but only on those occasions.  Her son would be the one to administer the Lasix after confirmation that she gained 3 pounds in a 24-hour period. [name] is stable on her current medications her depression is stable we'll monitor her lipid liver and a basic metabolic panel at her next visit in 4 months

## 2011-03-20 ENCOUNTER — Telehealth: Payer: Self-pay | Admitting: Internal Medicine

## 2011-03-20 NOTE — Telephone Encounter (Signed)
This was reordered and pt informed that is the way the new system shows it

## 2011-03-20 NOTE — Telephone Encounter (Signed)
Pt stated carvedilol was ?discontinued on check out sheet.

## 2011-03-24 ENCOUNTER — Other Ambulatory Visit: Payer: Self-pay | Admitting: Internal Medicine

## 2011-04-16 ENCOUNTER — Other Ambulatory Visit: Payer: Self-pay | Admitting: Internal Medicine

## 2011-05-21 ENCOUNTER — Other Ambulatory Visit: Payer: Self-pay | Admitting: Cardiology

## 2011-05-21 ENCOUNTER — Other Ambulatory Visit: Payer: Self-pay | Admitting: Internal Medicine

## 2011-06-12 DIAGNOSIS — N814 Uterovaginal prolapse, unspecified: Secondary | ICD-10-CM | POA: Diagnosis not present

## 2011-06-25 ENCOUNTER — Encounter: Payer: Self-pay | Admitting: Cardiology

## 2011-06-25 ENCOUNTER — Ambulatory Visit (INDEPENDENT_AMBULATORY_CARE_PROVIDER_SITE_OTHER): Payer: Medicare Other | Admitting: Cardiology

## 2011-06-25 VITALS — BP 150/74 | HR 64 | Ht 59.0 in | Wt 143.8 lb

## 2011-06-25 DIAGNOSIS — I10 Essential (primary) hypertension: Secondary | ICD-10-CM

## 2011-06-25 DIAGNOSIS — I5032 Chronic diastolic (congestive) heart failure: Secondary | ICD-10-CM

## 2011-06-25 DIAGNOSIS — I509 Heart failure, unspecified: Secondary | ICD-10-CM

## 2011-06-25 DIAGNOSIS — I6529 Occlusion and stenosis of unspecified carotid artery: Secondary | ICD-10-CM | POA: Diagnosis not present

## 2011-06-25 DIAGNOSIS — E785 Hyperlipidemia, unspecified: Secondary | ICD-10-CM

## 2011-06-25 DIAGNOSIS — I2510 Atherosclerotic heart disease of native coronary artery without angina pectoris: Secondary | ICD-10-CM | POA: Diagnosis not present

## 2011-06-25 LAB — BASIC METABOLIC PANEL
Calcium: 9.6 mg/dL (ref 8.4–10.5)
Creatinine, Ser: 1.4 mg/dL — ABNORMAL HIGH (ref 0.4–1.2)
GFR: 38.27 mL/min — ABNORMAL LOW (ref >60.00)
Glucose, Bld: 88 mg/dL (ref 70–99)
Sodium: 141 mEq/L (ref 135–145)

## 2011-06-25 NOTE — Patient Instructions (Addendum)
Your physician recommends that you have  lab work today--BMET  Your physician wants you to follow-up in: 6 months with Dr Shirlee Latch. (October 2013).You will receive a reminder letter in the mail two months in advance. If you don't receive a letter, please call our office to schedule the follow-up appointment.   Your physician has requested that you have a carotid duplex. This test is an ultrasound of the carotid arteries in your neck. It looks at blood flow through these arteries that supply the brain with blood. Allow one hour for this exam. There are no restrictions or special instructions. November 2013

## 2011-06-26 DIAGNOSIS — I6529 Occlusion and stenosis of unspecified carotid artery: Secondary | ICD-10-CM | POA: Insufficient documentation

## 2011-06-26 NOTE — Assessment & Plan Note (Signed)
BP elevated in office today but has been at goal when checked at home.  Will not make changes today.

## 2011-06-26 NOTE — Assessment & Plan Note (Signed)
Weight is stable off Lasix (stopped because of elevated creatinine).  Will repeat BMET today.

## 2011-06-26 NOTE — Progress Notes (Signed)
PCP: Dr. Lovell Sheehan  78 yo with h/o CAD and TIAs vs atypical migraines presents for followup.  She is living at home by herself though her son lives around the corner.  She is able to walk around her house and to the mailbox without exertional dyspnea.  I had her stop Lasix with increased creatinine and weight has remained stable.  She uses a recumbent bike in her house for exercise.  She has not had any chest pain.  Last echo showed preserved LV systolic function.  She has had no recent neurologic episodes (TIA vs atypical migraine).  No orthopnea.  No lightheadedness with standing.  BP is high today but has been in the 130s systolic when checked at home.   ECG: NSR, normal  Labs (8/10): K 4.2, creatinine 1.3 Labs (1/11): LDL 36, HDL 63, TSH normal Labs (9/11): K 4.6, creatinine 1.3 Labs (4/12): LDL 43, HDL 57 Labs (10/12): creatinine 1.7 Labs (4/13): LDL 43, HDL 57   Allergies (verified):  1)  ! * Clonidine Patch Glue  Past Medical History: 1.  HTN 2.  CAD:  NSTEMI 3/10.  LHC showed 80% mLAD, 90% mCFX.  She had PCI with Endeavor 2.5 x 12 to LAD and Endeavor 3.0 x 12 to CFX.   3.  Diastolic CHF:  Echo (3/10) EF 40-98% with periapical akinesis.  Mild LVH.  Mild MR.  Pseudonormal diastolic function.  Echo (4/10): EF 65% with mild focal basal septal hypertrophy.  Pseudonormal diastolic function.  Normal wall motion.  Moderate biatrial enlargement.  PASP 61 mmHg.  Echo (4/11): EF 55%, inferobasal hypokinesis, mild MR, PA systolic pressure 44 mmHg.  4.  Frequent falls 5.  Lacunar stroke on CT 6.  Expressive aphasia episodes:  More likely atypical migraine than TIA.  Carotid dopplers (8/'09) with 40-60% LICA stenosis.  Carotid dopplers (11/12) with 40-59% bilateral ICA stenosis.  7.  Left Horners syndrome.  8.  Transient SVT 9.  Hypothyroidism.  10. 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  Lives alone but son close by.  Alcohol use-no Drug use-no Regular exercise-no   Current Outpatient Prescriptions  Medication Sig Dispense Refill  . amLODipine (NORVASC) 10 MG tablet Take 1 tablet (10 mg total) by mouth daily.  30 tablet  7  . aspirin 81 MG tablet Take 162 mg by mouth daily.       . Boost (BOOST) LIQD Take 237 mLs by mouth daily.      . calcium carbonate (OS-CAL) 600 MG TABS Take 600 mg by mouth daily.      . carvedilol (COREG) 12.5 MG tablet Take 1 tablet (12.5 mg total) by mouth 2 (two) times daily.      . cloNIDine (CATAPRES) 0.1 MG tablet TAKE 1 TABLET 3 TIMES A DAY BY MOUTH  90 tablet  1  . DIOVAN 160 MG tablet TAKE 1 TABLET TWICE A DAY  60 tablet  5  . lactulose (CHRONULAC) 10 GM/15ML solution TAKE 15 ML BY MOUTH DAILY FOR BOWELS,INCREASE TO 30 ML IF CONSTIPATED  300 mL  4  . levETIRAcetam (KEPPRA) 250 MG tablet Take 250 mg by mouth 2 (two) times daily.        [name] levothyroxine (SYNTHROID, LEVOTHROID) 25 MCG tablet TAKE 1 TABLET EVERY DAY  30 tablet  5  . nitroGLYCERIN (NITROSTAT) 0.4 MG SL tablet Place 0.4  mg under the tongue every 5 (five) minutes as needed.        . sertraline (ZOLOFT) 25 MG tablet TAKE 1 TABLET BY MOUTH EVERY DAY  30 tablet  2  . simvastatin (ZOCOR) 40 MG tablet TAKE 1 TABLET BY MOUTH BEFORE BED  30 tablet  5  . Vitamin D, Ergocalciferol, (DRISDOL) 50000 UNITS CAPS TAKE 1 CAPSULE BY MOUTH WEEKLY  30 capsule  1    BP 150/74  Pulse 64  Ht 4\' 11"  (1.499 m)  Wt 143 lb 12.8 oz (65.227 kg)  BMI 29.04 kg/m2 General:  Elderly female, no apparent distress.  Neck:  Neck supple, no JVD. No masses, thyromegaly or abnormal cervical nodes. Lungs:  Clear bilaterally to auscultation and percussion. Heart:  Non-displaced PMI, chest non-tender; regular rate and rhythm, S1, S2 without rubs or gallops. 2/6 early systolic ejection murmur RUSB.  Carotid upstroke normal, no bruit.  Trace ankle edema.  Abdomen:  Bowel sounds positive; abdomen soft  and non-tender without masses, organomegaly, or hernias noted. No hepatosplenomegaly. Extremities:  No clubbing or cyanosis. Neurologic:  Alert and oriented x 3. Psych:  Normal affect.

## 2011-06-26 NOTE — Assessment & Plan Note (Signed)
No ischemic symptoms.  Continue ASA, Coreg, ARB, and statin.   

## 2011-06-26 NOTE — Assessment & Plan Note (Signed)
Moderate carotid stenosis.  Repeat dopplers in 11/13.

## 2011-06-26 NOTE — Assessment & Plan Note (Signed)
LDL at goal (< 70) on current statin dose.

## 2011-07-15 ENCOUNTER — Other Ambulatory Visit: Payer: Self-pay | Admitting: Internal Medicine

## 2011-07-16 ENCOUNTER — Telehealth: Payer: Self-pay | Admitting: Cardiology

## 2011-07-16 NOTE — Telephone Encounter (Signed)
Patient's son Charisa Twitty called to check the status of his FMLA that he had dropped off for completion. I advised Mr. Flurry that Dr. Shirlee Latch had completed his form. Mr. Selvage stopped by this afternoon and picked up his FMLA form. djc

## 2011-07-18 ENCOUNTER — Ambulatory Visit: Payer: Medicare Other | Admitting: Internal Medicine

## 2011-07-24 ENCOUNTER — Other Ambulatory Visit: Payer: Self-pay | Admitting: Internal Medicine

## 2011-07-25 ENCOUNTER — Encounter: Payer: Self-pay | Admitting: Internal Medicine

## 2011-07-25 ENCOUNTER — Ambulatory Visit (INDEPENDENT_AMBULATORY_CARE_PROVIDER_SITE_OTHER): Payer: Medicare Other | Admitting: Internal Medicine

## 2011-07-25 VITALS — BP 138/88 | HR 76 | Temp 98.2°F | Resp 16 | Ht <= 58 in | Wt 142.0 lb

## 2011-07-25 DIAGNOSIS — I10 Essential (primary) hypertension: Secondary | ICD-10-CM

## 2011-07-25 DIAGNOSIS — T887XXA Unspecified adverse effect of drug or medicament, initial encounter: Secondary | ICD-10-CM | POA: Diagnosis not present

## 2011-07-25 DIAGNOSIS — E785 Hyperlipidemia, unspecified: Secondary | ICD-10-CM

## 2011-07-25 LAB — CBC WITH DIFFERENTIAL/PLATELET
Basophils Absolute: 0 10*3/uL (ref 0.0–0.1)
Eosinophils Absolute: 0.3 10*3/uL (ref 0.0–0.7)
HCT: 36.7 % (ref 36.0–46.0)
Lymphs Abs: 2.2 10*3/uL (ref 0.7–4.0)
MCHC: 33.2 g/dL (ref 30.0–36.0)
MCV: 91.1 fl (ref 78.0–100.0)
Monocytes Absolute: 0.9 10*3/uL (ref 0.1–1.0)
Neutro Abs: 5.3 10*3/uL (ref 1.4–7.7)
Platelets: 233 10*3/uL (ref 150.0–400.0)
RDW: 14.5 % (ref 11.5–14.6)

## 2011-07-25 LAB — BASIC METABOLIC PANEL
Calcium: 9.5 mg/dL (ref 8.4–10.5)
Creatinine, Ser: 1.5 mg/dL — ABNORMAL HIGH (ref 0.4–1.2)
GFR: 35.57 mL/min — ABNORMAL LOW (ref >60.00)
Sodium: 140 mEq/L (ref 135–145)

## 2011-07-25 LAB — LIPID PANEL
Cholesterol: 117 mg/dL (ref 0–200)
HDL: 64.7 mg/dL (ref >39.00)
Triglycerides: 169 mg/dL — ABNORMAL HIGH (ref 0.0–149.0)
VLDL: 33.8 mg/dL (ref 0.0–40.0)

## 2011-07-25 LAB — HEPATIC FUNCTION PANEL
Albumin: 3.9 g/dL (ref 3.5–5.2)
Alkaline Phosphatase: 56 U/L (ref 39–117)

## 2011-07-25 LAB — TSH: TSH: 3.46 u[IU]/mL (ref 0.35–5.50)

## 2011-07-25 NOTE — Patient Instructions (Signed)
The patient is instructed to continue all medications as prescribed. Schedule followup with check out clerk upon leaving the clinic  

## 2011-07-25 NOTE — Progress Notes (Signed)
Subjective:    Patient ID: [name], female    DOB: 1921-01-18, 78 y.o.   MRN: [id number]  HPI Follow up for HTN, lipid control, and CRI Creatinine was improved in march blood work Stable mild OBS Blood pressure stable   Review of Systems  Constitutional: Negative for activity change, appetite change and fatigue.  HENT: Negative for ear pain, congestion, neck pain, postnasal drip and sinus pressure.   Eyes: Negative for redness and visual disturbance.  Respiratory: Negative for cough, shortness of breath and wheezing.   Gastrointestinal: Negative for abdominal pain and abdominal distention.  Genitourinary: Negative for dysuria, frequency and menstrual problem.  Musculoskeletal: Negative for myalgias, joint swelling and arthralgias.  Skin: Negative for rash and wound.  Neurological: Negative for dizziness, weakness and headaches.  Hematological: Negative for adenopathy. Does not bruise/bleed easily.  Psychiatric/Behavioral: Negative for sleep disturbance and decreased concentration.   Past Medical History  Diagnosis Date  . Hypertension   . Coronary artery disease     NSTEMI 3/10. LHC showed 80% mLAD, 90% mCFX. She had PCI with Endeavor 2.5 x 12 to LAD and Endeavor 3.0 x 12 to CFX  . CHF (congestive heart failure)     Diastolic CHF. Echo (3/10) EF 45-50% with periapical akinesis. Mild LVH. Mild MR. Pseudonormal diastolic function. Echo (4/10): EF 65% with mild focal basal septal hypertrophy. Pseudonormal diastolic function. Normal wall motion. Moderate biatrial enlargement. PASP 61 mmHg. Echo (4/11): EF 55%, inferobasal hypokinesis, mild MR, PA systolic pressure 44 mmHg.  . Frequent falls   . Lacunar stroke     on CT  . Expressive aphasia syndrome     More likely atypical migraine than TIA. Carotid dopplers (8/09) with 40-60% LICA stenosis.  . Horner's syndrome     left  . SVT (supraventricular tachycardia)     transient  . Hypothyroidism     History   Social History    . Marital Status: Widowed    Spouse Name: N/A    Number of Children: N/A  . Years of Education: N/A   Occupational History  . Not on file.   Social History Main Topics  . Smoking status: Former Smoker    Types: Cigarettes    Quit date: 05/25/1950  . Smokeless tobacco: Never Used  . Alcohol Use: No  . Drug Use: No  . Sexually Active: Yes   Other Topics Concern  . Not on file   Social History Narrative  . No narrative on file    No past surgical history on file.  Family History  Problem Relation Age of Onset  . Hypertension Mother   . COPD Father     No Known Allergies  Current Outpatient Prescriptions on File Prior to Visit  Medication Sig Dispense Refill  . amLODipine (NORVASC) 10 MG tablet Take 1 tablet (10 mg total) by mouth daily.  30 tablet  7  . aspirin 81 MG tablet Take 162 mg by mouth daily.       . Boost (BOOST) LIQD Take 237 mLs by mouth daily.      . calcium carbonate (OS-CAL) 600 MG TABS Take 600 mg by mouth daily.      . carvedilol (COREG) 12.5 MG tablet Take 1 tablet (12.5 mg total) by mouth 2 (two) times daily.      . cloNIDine (CATAPRES) 0.1 MG tablet TAKE 1 TABLET 3 TIMES A DAY BY MOUTH  90 tablet  1  . DIOVAN 160 MG tablet TAKE 1 TABLET  TWICE A DAY  60 tablet  5  . lactulose (CHRONULAC) 10 GM/15ML solution TAKE 15 ML BY MOUTH DAILY FOR BOWELS,INCREASE TO 30 ML IF CONSTIPATED  300 mL  4  . levETIRAcetam (KEPPRA) 250 MG tablet Take 250 mg by mouth 2 (two) times daily.        [name] levothyroxine (SYNTHROID, LEVOTHROID) 25 MCG tablet TAKE 1 TABLET EVERY DAY  30 tablet  5  . nitroGLYCERIN (NITROSTAT) 0.4 MG SL tablet Place 0.4 mg under the tongue every 5 (five) minutes as needed.        . sertraline (ZOLOFT) 25 MG tablet TAKE 1 TABLET BY MOUTH EVERY DAY  30 tablet  2  . simvastatin (ZOCOR) 40 MG tablet TAKE 1 TABLET BY MOUTH BEFORE BED  30 tablet  5  . Vitamin D, Ergocalciferol, (DRISDOL) 50000 UNITS CAPS TAKE 1 CAPSULE BY MOUTH WEEKLY  30 capsule  1     BP 138/88  Pulse 76  Temp 98.2 F (36.8 C)  Resp 16  Ht 4\' 9"  (1.448 m)  Wt 142 lb (64.411 kg)  BMI 30.73 kg/m2       Objective:   Physical Exam  Nursing note and vitals reviewed. Constitutional: She is oriented to person, place, and time. She appears well-developed and well-nourished. No distress.  HENT:  Head: Normocephalic and atraumatic.  Right Ear: External ear normal.  Left Ear: External ear normal.  Nose: Nose normal.  Mouth/Throat: Oropharynx is clear and moist.  Eyes: Conjunctivae and EOM are normal. Pupils are equal, round, and reactive to light.  Neck: Normal range of motion. Neck supple. No JVD present. No tracheal deviation present. No thyromegaly present.  Cardiovascular: Normal rate, regular rhythm, normal heart sounds and intact distal pulses.   No murmur heard. Pulmonary/Chest: Effort normal and breath sounds normal. She has no wheezes. She exhibits no tenderness.  Abdominal: Soft. Bowel sounds are normal.  Musculoskeletal: Normal range of motion. She exhibits no edema and no tenderness.  Lymphadenopathy:    She has no cervical adenopathy.  Neurological: She is alert and oriented to person, place, and time. She has normal reflexes. No cranial nerve deficit.  Skin: Skin is warm and dry. She is not diaphoretic.  Psychiatric: She has a normal mood and affect. Her behavior is normal.          Assessment & Plan:  monitor lipids, liver,  bmet forCRI and TSH  Stable monitor in 4 months

## 2011-08-01 ENCOUNTER — Other Ambulatory Visit: Payer: Self-pay | Admitting: Cardiology

## 2011-08-13 ENCOUNTER — Other Ambulatory Visit: Payer: Self-pay | Admitting: Cardiology

## 2011-08-28 ENCOUNTER — Other Ambulatory Visit: Payer: Self-pay | Admitting: Internal Medicine

## 2011-09-04 ENCOUNTER — Other Ambulatory Visit: Payer: Self-pay | Admitting: Internal Medicine

## 2011-09-04 DIAGNOSIS — Z1231 Encounter for screening mammogram for malignant neoplasm of breast: Secondary | ICD-10-CM

## 2011-09-09 ENCOUNTER — Other Ambulatory Visit: Payer: Self-pay | Admitting: Internal Medicine

## 2011-09-12 ENCOUNTER — Other Ambulatory Visit: Payer: Self-pay | Admitting: Cardiology

## 2011-09-25 DIAGNOSIS — N814 Uterovaginal prolapse, unspecified: Secondary | ICD-10-CM | POA: Diagnosis not present

## 2011-10-09 ENCOUNTER — Other Ambulatory Visit: Payer: Self-pay | Admitting: Internal Medicine

## 2011-10-24 ENCOUNTER — Ambulatory Visit
Admission: RE | Admit: 2011-10-24 | Discharge: 2011-10-24 | Disposition: A | Discharge status: Home / Self Care | Payer: Medicare Other | Source: Ambulatory Visit | Attending: Internal Medicine | Admitting: Internal Medicine

## 2011-10-24 DIAGNOSIS — Z1231 Encounter for screening mammogram for malignant neoplasm of breast: Secondary | ICD-10-CM

## 2011-11-08 ENCOUNTER — Other Ambulatory Visit: Payer: Self-pay | Admitting: Internal Medicine

## 2011-11-13 ENCOUNTER — Other Ambulatory Visit: Payer: Self-pay | Admitting: Cardiology

## 2011-11-26 ENCOUNTER — Encounter: Payer: Self-pay | Admitting: Internal Medicine

## 2011-11-26 ENCOUNTER — Ambulatory Visit (INDEPENDENT_AMBULATORY_CARE_PROVIDER_SITE_OTHER): Payer: Medicare Other | Admitting: Internal Medicine

## 2011-11-26 VITALS — BP 140/72 | HR 76 | Temp 98.2°F | Resp 16 | Ht <= 58 in | Wt 141.0 lb

## 2011-11-26 DIAGNOSIS — E785 Hyperlipidemia, unspecified: Secondary | ICD-10-CM

## 2011-11-26 DIAGNOSIS — I872 Venous insufficiency (chronic) (peripheral): Secondary | ICD-10-CM | POA: Diagnosis not present

## 2011-11-26 DIAGNOSIS — I10 Essential (primary) hypertension: Secondary | ICD-10-CM | POA: Diagnosis not present

## 2011-11-26 DIAGNOSIS — E039 Hypothyroidism, unspecified: Secondary | ICD-10-CM | POA: Diagnosis not present

## 2011-11-26 DIAGNOSIS — Z23 Encounter for immunization: Secondary | ICD-10-CM | POA: Diagnosis not present

## 2011-11-26 NOTE — Progress Notes (Signed)
Subjective:    Patient ID: [name], female    DOB: [birth year], 78 y.o.   MRN: [id number]  HPI Patient is an 78 year old female who presents for followup of hypothyroidism hyperlipidemia and hypertension.  Her TSH was stable her last lipid profile was excellent with a low LDL and HDL greater than 60.  Her blood pressure stable today she is without signs of symptoms   Review of Systems  Constitutional: Positive for fatigue. Negative for activity change and appetite change.  HENT: Negative for ear pain, congestion, neck pain, postnasal drip and sinus pressure.   Eyes: Negative for redness and visual disturbance.  Respiratory: Negative for cough, shortness of breath and wheezing.   Gastrointestinal: Negative for abdominal pain and abdominal distention.  Genitourinary: Negative for dysuria, frequency and menstrual problem.  Musculoskeletal: Positive for gait problem. Negative for myalgias and arthralgias.  Skin: Negative for rash and wound.  Neurological: Negative for dizziness and headaches.  Hematological: Negative for adenopathy. Does not bruise/bleed easily.  Psychiatric/Behavioral: Negative for disturbed wake/sleep cycle and decreased concentration.   Past Medical History  Diagnosis Date  . Hypertension   . Coronary artery disease     NSTEMI 3/10. LHC showed 80% mLAD, 90% mCFX. She had PCI with Endeavor 2.5 x 12 to LAD and Endeavor 3.0 x 12 to CFX  . CHF (congestive heart failure)     Diastolic CHF. Echo (3/10) EF 45-50% with periapical akinesis. Mild LVH. Mild MR. Pseudonormal diastolic function. Echo (4/10): EF 65% with mild focal basal septal hypertrophy. Pseudonormal diastolic function. Normal wall motion. Moderate biatrial enlargement. PASP 61 mmHg. Echo (4/11): EF 55%, inferobasal hypokinesis, mild MR, PA systolic pressure 44 mmHg.  . Frequent falls   . Lacunar stroke     on CT  . Expressive aphasia syndrome     More likely atypical migraine than TIA. Carotid dopplers  (8/09) with 40-60% LICA stenosis.  . Horner's syndrome     left  . SVT (supraventricular tachycardia)     transient  . Hypothyroidism     History   Social History  . Marital Status: Widowed    Spouse Name: N/A    Number of Children: N/A  . Years of Education: N/A   Occupational History  . Not on file.   Social History Main Topics  . Smoking status: Former Smoker    Types: Cigarettes    Quit date: 05/25/1950  . Smokeless tobacco: Never Used  . Alcohol Use: No  . Drug Use: No  . Sexually Active: Yes   Other Topics Concern  . Not on file   Social History Narrative  . No narrative on file    No past surgical history on file.  Family History  Problem Relation Age of Onset  . Hypertension Mother   . COPD Father     No Known Allergies  Current Outpatient Prescriptions on File Prior to Visit  Medication Sig Dispense Refill  . amLODipine (NORVASC) 10 MG tablet TAKE 1 TABLET BY MOUTH DAILY  30 tablet  7  . aspirin 81 MG tablet Take 162 mg by mouth daily.       . Boost (BOOST) LIQD Take 237 mLs by mouth daily.      . calcium carbonate (OS-CAL) 600 MG TABS Take 600 mg by mouth daily.      . carvedilol (COREG) 12.5 MG tablet Take 1 tablet (12.5 mg total) by mouth 2 (two) times daily.      . carvedilol (COREG) 12.5  MG tablet TAKE 1 TABLET TWICE A DAY  60 tablet  4  . cloNIDine (CATAPRES) 0.1 MG tablet TAKE 1 TABLET 3 TIMES A DAY BY MOUTH  90 tablet  1  . DIOVAN 160 MG tablet TAKE 1 TABLET TWICE A DAY  60 tablet  5  . lactulose (CHRONULAC) 10 GM/15ML solution TAKE 15 ML BY MOUTH DAILY FOR BOWELS,INCREASE TO 30 ML IF CONSTIPATED  300 mL  4  . levETIRAcetam (KEPPRA) 250 MG tablet Take 250 mg by mouth 2 (two) times daily.        [name] levothyroxine (SYNTHROID, LEVOTHROID) 25 MCG tablet TAKE 1 TABLET EVERY DAY  30 tablet  5  . nitroGLYCERIN (NITROSTAT) 0.4 MG SL tablet Place 0.4 mg under the tongue every 5 (five) minutes as needed.        . sertraline (ZOLOFT) 25 MG tablet TAKE 1  TABLET BY MOUTH EVERY DAY  30 tablet  2  . simvastatin (ZOCOR) 40 MG tablet TAKE 1 TABLET BY MOUTH BEFORE BED  30 tablet  5  . Vitamin D, Ergocalciferol, (DRISDOL) 50000 UNITS CAPS TAKE 1 CAPSULE BY MOUTH WEEKLY  30 capsule  0    BP 140/72  Pulse 76  Temp 98.2 F (36.8 C)  Resp 16  Ht 4\' 9"  (1.448 m)  Wt 141 lb (63.957 kg)  BMI 30.51 kg/m2        Objective:   Physical Exam  Nursing note and vitals reviewed. Constitutional: She is oriented to person, place, and time. She appears well-developed and well-nourished. No distress.  HENT:  Head: Normocephalic and atraumatic.  Left Ear: External ear normal.  Eyes: Conjunctivae normal and EOM are normal. Pupils are equal, round, and reactive to light.  Neck: Normal range of motion. Neck supple. No JVD present. No tracheal deviation present. No thyromegaly present.  Cardiovascular: Normal rate and regular rhythm.   Murmur heard. Pulmonary/Chest: Effort normal and breath sounds normal. She has no wheezes. She exhibits no tenderness.  Abdominal: Soft. Bowel sounds are normal.  Musculoskeletal: She exhibits edema. She exhibits no tenderness.  Lymphadenopathy:    She has no cervical adenopathy.  Neurological: She is alert and oriented to person, place, and time. No cranial nerve deficit.  Skin: Skin is warm and dry. She is not diaphoretic.          Assessment & Plan:  Stable lipids with hx of PAD Stable HTN Stable nutrition Follow up in 3 months

## 2011-12-25 DIAGNOSIS — H2510 Age-related nuclear cataract, unspecified eye: Secondary | ICD-10-CM | POA: Diagnosis not present

## 2011-12-28 ENCOUNTER — Other Ambulatory Visit: Payer: Self-pay | Admitting: *Deleted

## 2011-12-28 ENCOUNTER — Other Ambulatory Visit: Payer: Self-pay | Admitting: Internal Medicine

## 2011-12-28 MED ORDER — LACTULOSE 10 GM/15ML PO SOLN: 10.0000 g | Freq: Every day | ORAL | Status: DC

## 2012-01-05 ENCOUNTER — Other Ambulatory Visit: Payer: Self-pay | Admitting: Internal Medicine

## 2012-01-07 DIAGNOSIS — N814 Uterovaginal prolapse, unspecified: Secondary | ICD-10-CM | POA: Diagnosis not present

## 2012-01-15 ENCOUNTER — Encounter (INDEPENDENT_AMBULATORY_CARE_PROVIDER_SITE_OTHER): Payer: Medicare Other

## 2012-01-15 DIAGNOSIS — I6529 Occlusion and stenosis of unspecified carotid artery: Secondary | ICD-10-CM

## 2012-01-16 ENCOUNTER — Other Ambulatory Visit: Payer: Self-pay | Admitting: Internal Medicine

## 2012-01-21 ENCOUNTER — Encounter: Payer: Self-pay | Admitting: Cardiology

## 2012-01-21 ENCOUNTER — Other Ambulatory Visit: Payer: Self-pay

## 2012-01-21 ENCOUNTER — Ambulatory Visit (INDEPENDENT_AMBULATORY_CARE_PROVIDER_SITE_OTHER): Payer: Medicare Other | Admitting: Cardiology

## 2012-01-21 VITALS — BP 140/58 | HR 62 | Resp 18 | Ht <= 58 in | Wt 141.0 lb

## 2012-01-21 DIAGNOSIS — E785 Hyperlipidemia, unspecified: Secondary | ICD-10-CM

## 2012-01-21 DIAGNOSIS — I6529 Occlusion and stenosis of unspecified carotid artery: Secondary | ICD-10-CM | POA: Diagnosis not present

## 2012-01-21 DIAGNOSIS — I2510 Atherosclerotic heart disease of native coronary artery without angina pectoris: Secondary | ICD-10-CM

## 2012-01-21 DIAGNOSIS — I5032 Chronic diastolic (congestive) heart failure: Secondary | ICD-10-CM

## 2012-01-21 DIAGNOSIS — I10 Essential (primary) hypertension: Secondary | ICD-10-CM

## 2012-01-21 LAB — BASIC METABOLIC PANEL
CO2: 32 mEq/L (ref 19–32)
Calcium: 9.6 mg/dL (ref 8.4–10.5)
Creatinine, Ser: 1.4 mg/dL — ABNORMAL HIGH (ref 0.4–1.2)
GFR: 36.39 mL/min — ABNORMAL LOW (ref >60.00)
Glucose, Bld: 108 mg/dL — ABNORMAL HIGH (ref 70–99)

## 2012-01-21 MED ORDER — ATORVASTATIN CALCIUM 20 MG PO TABS: 20.0000 mg | ORAL_TABLET | Freq: Every day | ORAL | Status: DC

## 2012-01-21 NOTE — Progress Notes (Signed)
Patient ID: [name], female   DOB: [birth year], 78 y.o.   MRN: [id number] PCP: Dr. Lovell Sheehan  78 yo with h/o CAD and TIAs vs atypical migraines presents for followup.  She is living at home by herself though her son lives around the corner.  She is able to walk around her house and to the mailbox without exertional dyspnea.  She can climb a flight of steps.  I had her stop Lasix with increased creatinine and weight has remained stable (actually down 2 lb compared to prior appointment).  She has not had any chest pain.  Last echo showed preserved LV systolic function.  She has had no recent neurologic episodes (TIA vs atypical migraine).  No orthopnea.  No lightheadedness with standing.    Labs (8/10): K 4.2, creatinine 1.3 Labs (1/11): LDL 36, HDL 63, TSH normal Labs (9/11): K 4.6, creatinine 1.3 Labs (4/12): LDL 43, HDL 57 Labs (10/12): creatinine 1.7 Labs (4/13): LDL 43, HDL 57  Labs (5/13): K 4.9, creatinine 1.5  Allergies (verified):  1)  ! * Clonidine Patch Glue  Past Medical History: 1.  HTN 2.  CAD:  NSTEMI 3/10.  LHC showed 80% mLAD, 90% mCFX.  She had PCI with Endeavor 2.5 x 12 to LAD and Endeavor 3.0 x 12 to CFX.   3.  Diastolic CHF:  Echo (3/10) EF 40-98% with periapical akinesis.  Mild LVH.  Mild MR.  Pseudonormal diastolic function.  Echo (4/10): EF 65% with mild focal basal septal hypertrophy.  Pseudonormal diastolic function.  Normal wall motion.  Moderate biatrial enlargement.  PASP 61 mmHg.  Echo (4/11): EF 55%, inferobasal hypokinesis, mild MR, PA systolic pressure 44 mmHg.  4.  Frequent falls 5.  Lacunar stroke on CT 6.  Expressive aphasia episodes:  More likely atypical migraine than TIA.  Carotid dopplers (8/'09) with 40-60% LICA stenosis.  Carotid dopplers (11/12) with 40-59% bilateral ICA stenosis.  Carotid dopplers (11/13) with 40-59% bilateral ICA stenosis.  7.  Left Horners syndrome.  8.  Transient SVT 9.  Hypothyroidism.  10. 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  Lives alone but son close by.  Alcohol use-no Drug use-no Regular exercise-no  ROS: All systems reviewed and negative except as per HPI.    Current Outpatient Prescriptions  Medication Sig Dispense Refill  . amLODipine (NORVASC) 10 MG tablet TAKE 1 TABLET BY MOUTH DAILY  30 tablet  7  . aspirin 81 MG tablet Take 162 mg by mouth daily.       . Boost (BOOST) LIQD Take 237 mLs by mouth daily.      . calcium carbonate (OS-CAL) 600 MG TABS Take 600 mg by mouth daily.      . carvedilol (COREG) 12.5 MG tablet Take 1 tablet (12.5 mg total) by mouth 2 (two) times daily.      . carvedilol (COREG) 12.5 MG tablet TAKE 1 TABLET TWICE A DAY  60 tablet  4  . cloNIDine (CATAPRES) 0.1 MG tablet TAKE 1 TABLET 3 TIMES A DAY BY MOUTH  90 tablet  1  . DIOVAN 160 MG tablet TAKE 1 TABLET TWICE A DAY  60 tablet  5  . lactulose (CHRONULAC) 10 GM/15ML solution Take 15 mLs (10 g total) by mouth at bedtime.  300 mL  6  . levETIRAcetam (KEPPRA) 250 MG tablet Take 250  mg by mouth 2 (two) times daily.        [name] levothyroxine (SYNTHROID, LEVOTHROID) 25 MCG tablet TAKE 1 TABLET EVERY DAY  30 tablet  5  . nitroGLYCERIN (NITROSTAT) 0.4 MG SL tablet Place 0.4 mg under the tongue every 5 (five) minutes as needed.        . sertraline (ZOLOFT) 25 MG tablet TAKE 1 TABLET BY MOUTH EVERY DAY  30 tablet  2  . Vitamin D, Ergocalciferol, (DRISDOL) 50000 UNITS CAPS TAKE 1 CAPSULE BY MOUTH WEEKLY  30 capsule  0  . atorvastatin (LIPITOR) 20 MG tablet Take 1 tablet (20 mg total) by mouth daily.  90 tablet  3    BP 140/58  Pulse 62  Resp 18  Ht 4\' 9"  (1.448 m)  Wt 141 lb (63.957 kg)  BMI 30.51 kg/m2  SpO2 97% General:  Elderly female, no apparent distress.  Neck:  Neck supple, no JVD. No masses, thyromegaly or abnormal cervical nodes. Lungs:  Clear bilaterally to auscultation and percussion. Heart:   Non-displaced PMI, chest non-tender; regular rate and rhythm, S1, S2 without rubs or gallops. 2/6 early systolic ejection murmur RUSB.  Carotid upstroke normal, no bruit.  Trace ankle edema.  Abdomen:  Bowel sounds positive; abdomen soft and non-tender without masses, organomegaly, or hernias noted. No hepatosplenomegaly. Extremities:  No clubbing or cyanosis. Neurologic:  Alert and oriented x 3. Psych:  Normal affect.  Assessment/Plan:  CORONARY ATHEROSCLEROSIS NATIVE CORONARY ARTERY No ischemic symptoms. Continue ASA, Coreg, ARB, and statin.  HYPERLIPIDEMIA-MIXED  She is on amlodipine 10 and Zocor 40, which has been associated with increased risk of adverse effects.  I am going to have her stop Zocor and use atorvastatin 20 mg daily instead.  Lipids/LFTs in 2 months.  HYPERTENSION BP ok today.  Continue current meds.   DIASTOLIC HEART FAILURE, CHRONIC Weight is stable off Lasix (stopped because of elevated creatinine) and she is symptomatically stable. Will repeat BMET today.  Carotid stenosis  Mild to Moderate carotid stenosis.  This has been stable long-term.  Given age, will not schedule followup dopplers.   Marca Ancona 01/21/2012 5:05 PM

## 2012-01-21 NOTE — Patient Instructions (Addendum)
Your physician wants you to follow-up in: 6 MONTHS WITH DR [facility] You will receive a reminder letter in the mail two months in advance. If you don't receive a letter, please call our office to schedule the follow-up appointment.   STOP SIMVASTATIN  START ATORVASTATIN 20 MG ONCE DAILY  Your physician recommends that you return for lab work [id number] AND IN 2 MONTHS= FASTING

## 2012-01-25 ENCOUNTER — Other Ambulatory Visit: Payer: Self-pay | Admitting: Internal Medicine

## 2012-01-26 ENCOUNTER — Other Ambulatory Visit: Payer: Self-pay | Admitting: Cardiology

## 2012-03-20 ENCOUNTER — Other Ambulatory Visit: Payer: Self-pay | Admitting: Internal Medicine

## 2012-03-24 ENCOUNTER — Other Ambulatory Visit (INDEPENDENT_AMBULATORY_CARE_PROVIDER_SITE_OTHER): Payer: Medicare Other

## 2012-03-24 DIAGNOSIS — I2510 Atherosclerotic heart disease of native coronary artery without angina pectoris: Secondary | ICD-10-CM

## 2012-03-24 LAB — HEPATIC FUNCTION PANEL
ALT: 20 U/L (ref 0–35)
Albumin: 4 g/dL (ref 3.5–5.2)
Alkaline Phosphatase: 69 U/L (ref 39–117)
Bilirubin, Direct: 0 mg/dL (ref 0.0–0.3)
Total Protein: 7.7 g/dL (ref 6.0–8.3)

## 2012-03-24 LAB — LIPID PANEL
Total CHOL/HDL Ratio: 2
Triglycerides: 120 mg/dL (ref 0.0–149.0)

## 2012-03-26 ENCOUNTER — Other Ambulatory Visit: Payer: Self-pay | Admitting: Internal Medicine

## 2012-03-26 ENCOUNTER — Ambulatory Visit (INDEPENDENT_AMBULATORY_CARE_PROVIDER_SITE_OTHER): Payer: Medicare Other | Admitting: Internal Medicine

## 2012-03-26 ENCOUNTER — Encounter: Payer: Self-pay | Admitting: Internal Medicine

## 2012-03-26 VITALS — BP 134/80 | HR 72 | Temp 97.8°F | Resp 16 | Ht <= 58 in | Wt 144.0 lb

## 2012-03-26 DIAGNOSIS — I10 Essential (primary) hypertension: Secondary | ICD-10-CM | POA: Diagnosis not present

## 2012-03-26 DIAGNOSIS — E039 Hypothyroidism, unspecified: Secondary | ICD-10-CM | POA: Diagnosis not present

## 2012-03-26 DIAGNOSIS — G40109 Localization-related (focal) (partial) symptomatic epilepsy and epileptic syndromes with simple partial seizures, not intractable, without status epilepticus: Secondary | ICD-10-CM

## 2012-03-26 DIAGNOSIS — I5032 Chronic diastolic (congestive) heart failure: Secondary | ICD-10-CM

## 2012-03-26 LAB — TSH: TSH: 3.61 u[IU]/mL (ref 0.35–5.50)

## 2012-03-26 NOTE — Progress Notes (Signed)
Subjective:    Patient ID: [name], female    DOB: 01-23-1921, 79 y.o.   MRN: [id number]  HPI patient is a 79 year old female Is seen for followup of hypertension hyperlipidemia hypothyroidism and history of falls.  She also had a cerebrovascular accident in the past and a post CVA seizure she is on Keppra for those symptomatologies.  Recently she had a lipid level drawn on the new cholesterol medication atorvastatin and has excellent results with an HDL higher than her LDL.  She is tolerating the medication well she is on cholesterol intervention for a history of carotid stenosis and ischemic cardiomyopathy    Review of Systems  Constitutional: Positive for fatigue. Negative for activity change and appetite change.  HENT: Negative for ear pain, congestion, neck pain, postnasal drip and sinus pressure.   Eyes: Negative for redness and visual disturbance.  Respiratory: Negative for cough, shortness of breath and wheezing.   Gastrointestinal: Negative for abdominal pain and abdominal distention.  Genitourinary: Negative for dysuria, frequency and menstrual problem.  Musculoskeletal: Positive for myalgias and gait problem. Negative for joint swelling and arthralgias.  Skin: Negative for rash and wound.  Neurological: Positive for weakness. Negative for dizziness and headaches.  Hematological: Negative for adenopathy. Does not bruise/bleed easily.  Psychiatric/Behavioral: Positive for decreased concentration. Negative for sleep disturbance.   Past Medical History  Diagnosis Date  . Hypertension   . Coronary artery disease     NSTEMI 3/10. LHC showed 80% mLAD, 90% mCFX. She had PCI with Endeavor 2.5 x 12 to LAD and Endeavor 3.0 x 12 to CFX  . CHF (congestive heart failure)     Diastolic CHF. Echo (3/10) EF 45-50% with periapical akinesis. Mild LVH. Mild MR. Pseudonormal diastolic function. Echo (4/10): EF 65% with mild focal basal septal hypertrophy. Pseudonormal diastolic function.  Normal wall motion. Moderate biatrial enlargement. PASP 61 mmHg. Echo (4/11): EF 55%, inferobasal hypokinesis, mild MR, PA systolic pressure 44 mmHg.  . Frequent falls   . Lacunar stroke     on CT  . Expressive aphasia syndrome     More likely atypical migraine than TIA. Carotid dopplers (8/09) with 40-60% LICA stenosis.  . Horner's syndrome     left  . SVT (supraventricular tachycardia)     transient  . Hypothyroidism     History   Social History  . Marital Status: Widowed    Spouse Name: N/A    Number of Children: N/A  . Years of Education: N/A   Occupational History  . Not on file.   Social History Main Topics  . Smoking status: Former Smoker    Types: Cigarettes    Quit date: 05/25/1950  . Smokeless tobacco: Never Used  . Alcohol Use: No  . Drug Use: No  . Sexually Active: Yes   Other Topics Concern  . Not on file   Social History Narrative  . No narrative on file    No past surgical history on file.  Family History  Problem Relation Age of Onset  . Hypertension Mother   . COPD Father     No Known Allergies  Current Outpatient Prescriptions on File Prior to Visit  Medication Sig Dispense Refill  . amLODipine (NORVASC) 10 MG tablet TAKE 1 TABLET BY MOUTH DAILY  30 tablet  7  . aspirin 81 MG tablet Take 162 mg by mouth daily.       [name] atorvastatin (LIPITOR) 20 MG tablet Take 1 tablet (20 mg total) by mouth  daily.  90 tablet  3  . Boost (BOOST) LIQD Take 237 mLs by mouth daily.      . calcium carbonate (OS-CAL) 600 MG TABS Take 600 mg by mouth daily.      . carvedilol (COREG) 12.5 MG tablet Take 1 tablet (12.5 mg total) by mouth 2 (two) times daily.      . carvedilol (COREG) 12.5 MG tablet TAKE 1 TABLET TWICE A DAY  60 tablet  5  . cloNIDine (CATAPRES) 0.1 MG tablet TAKE 1 TABLET BY MOUTH 3 TIMES A DAY  90 tablet  1  . DIOVAN 160 MG tablet TAKE 1 TABLET TWICE A DAY  60 tablet  5  . lactulose (CHRONULAC) 10 GM/15ML solution Take 15 mLs (10 g total) by mouth  at bedtime.  300 mL  6  . levETIRAcetam (KEPPRA) 250 MG tablet Take 250 mg by mouth 2 (two) times daily.        [name] levothyroxine (SYNTHROID, LEVOTHROID) 25 MCG tablet TAKE 1 TABLET EVERY DAY  30 tablet  5  . nitroGLYCERIN (NITROSTAT) 0.4 MG SL tablet Place 0.4 mg under the tongue every 5 (five) minutes as needed.        . sertraline (ZOLOFT) 25 MG tablet TAKE 1 TABLET BY MOUTH EVERY DAY  30 tablet  2  . Vitamin D, Ergocalciferol, (DRISDOL) 50000 UNITS CAPS TAKE 1 CAPSULE BY MOUTH WEEKLY  30 capsule  0    BP 134/80  Pulse 72  Temp 97.8 F (36.6 C)  Resp 16  Ht 4\' 9"  (1.448 m)  Wt 144 lb (65.318 kg)  BMI 31.16 kg/m2       Objective:   Physical Exam  Nursing note and vitals reviewed. Constitutional: She is oriented to person, place, and time. She appears well-developed and well-nourished. No distress.  HENT:  Head: Normocephalic and atraumatic.  Eyes: Conjunctivae normal and EOM are normal. Pupils are equal, round, and reactive to light.  Neck: Normal range of motion. Neck supple. No JVD present. No tracheal deviation present. No thyromegaly present.  Cardiovascular: Normal rate and regular rhythm.   Murmur heard. Pulmonary/Chest: Effort normal and breath sounds normal. She has no wheezes. She exhibits no tenderness.  Abdominal: Soft. Bowel sounds are normal.  Musculoskeletal: Normal range of motion. She exhibits tenderness. She exhibits no edema.  Lymphadenopathy:    She has no cervical adenopathy.  Neurological: She is alert and oriented to person, place, and time. She has normal reflexes. No cranial nerve deficit.  Skin: Skin is warm and dry. She is not diaphoretic.  Psychiatric: She has a normal mood and affect. Her behavior is normal.          Assessment & Plan:  Doing exceptionally well reviewed her lipid panel and she is at goal.  Today we will check a thyroid and a Keppra level to make sure that these are appropriate otherwise she's stable stable blood pressure no  evidence of worsening of heart failure and no evidence of ongoing cerebral vascular events.

## 2012-03-26 NOTE — Patient Instructions (Signed)
The patient is instructed to continue all medications as prescribed. Schedule followup with check out clerk upon leaving the clinic  

## 2012-04-17 ENCOUNTER — Other Ambulatory Visit: Payer: Self-pay | Admitting: Internal Medicine

## 2012-04-17 ENCOUNTER — Other Ambulatory Visit: Payer: Self-pay | Admitting: Cardiology

## 2012-04-20 ENCOUNTER — Other Ambulatory Visit: Payer: Self-pay | Admitting: Internal Medicine

## 2012-05-07 DIAGNOSIS — N8111 Cystocele, midline: Secondary | ICD-10-CM | POA: Diagnosis not present

## 2012-05-26 ENCOUNTER — Other Ambulatory Visit: Payer: Self-pay | Admitting: Neurology

## 2012-06-08 ENCOUNTER — Other Ambulatory Visit: Payer: Self-pay | Admitting: Internal Medicine

## 2012-07-13 ENCOUNTER — Other Ambulatory Visit: Payer: Self-pay | Admitting: Internal Medicine

## 2012-07-13 ENCOUNTER — Other Ambulatory Visit: Payer: Self-pay | Admitting: Cardiology

## 2012-07-25 ENCOUNTER — Ambulatory Visit (INDEPENDENT_AMBULATORY_CARE_PROVIDER_SITE_OTHER): Payer: Medicare Other | Admitting: Internal Medicine

## 2012-07-25 ENCOUNTER — Encounter: Payer: Self-pay | Admitting: Internal Medicine

## 2012-07-25 ENCOUNTER — Ambulatory Visit: Payer: Medicare Other | Admitting: Internal Medicine

## 2012-07-25 VITALS — BP 124/76 | HR 68 | Temp 98.0°F | Resp 16 | Ht <= 58 in | Wt 142.0 lb

## 2012-07-25 DIAGNOSIS — E039 Hypothyroidism, unspecified: Secondary | ICD-10-CM | POA: Diagnosis not present

## 2012-07-25 DIAGNOSIS — I10 Essential (primary) hypertension: Secondary | ICD-10-CM

## 2012-07-25 DIAGNOSIS — E785 Hyperlipidemia, unspecified: Secondary | ICD-10-CM | POA: Diagnosis not present

## 2012-07-25 NOTE — Patient Instructions (Signed)
The patient is instructed to continue all medications as prescribed. Schedule followup with check out clerk upon leaving the clinic  

## 2012-07-25 NOTE — Progress Notes (Signed)
Subjective:    Patient ID: [name], female    DOB: Jun 09, 1920, 79 y.o.   MRN: [id number]  HPI  Is an 79 year old female who is seen for hypertension a history of coronary artery disease hyperlipidemia and osteoarthritis.  She will celebrate her 89th birthday in a few days her blood pressures and excellent control she had a change in her antique hyperlipidemia medications do to possible drug drug interaction and has been tolerating atorvastatin without difficulty  Review of Systems  Constitutional: Negative for activity change, appetite change and fatigue.  HENT: Negative.  Negative for ear pain, congestion, neck pain, postnasal drip and sinus pressure.   Eyes: Negative for redness and visual disturbance.  Respiratory: Negative for cough, shortness of breath and wheezing.   Gastrointestinal: Positive for abdominal pain and abdominal distention.  Genitourinary: Negative for dysuria, frequency and menstrual problem.  Musculoskeletal: Positive for joint swelling and arthralgias. Negative for myalgias.  Skin: Negative for rash and wound.  Neurological: Negative for dizziness, weakness and headaches.  Hematological: Negative for adenopathy. Does not bruise/bleed easily.  Psychiatric/Behavioral: Negative for sleep disturbance and decreased concentration.   Past Medical History  Diagnosis Date  . Hypertension   . Coronary artery disease     NSTEMI 3/10. LHC showed 80% mLAD, 90% mCFX. She had PCI with Endeavor 2.5 x 12 to LAD and Endeavor 3.0 x 12 to CFX  . CHF (congestive heart failure)     Diastolic CHF. Echo (3/10) EF 45-50% with periapical akinesis. Mild LVH. Mild MR. Pseudonormal diastolic function. Echo (4/10): EF 65% with mild focal basal septal hypertrophy. Pseudonormal diastolic function. Normal wall motion. Moderate biatrial enlargement. PASP 61 mmHg. Echo (4/11): EF 55%, inferobasal hypokinesis, mild MR, PA systolic pressure 44 mmHg.  . Frequent falls   . Lacunar stroke     on  CT  . Expressive aphasia syndrome     More likely atypical migraine than TIA. Carotid dopplers (8/09) with 40-60% LICA stenosis.  . Horner's syndrome     left  . SVT (supraventricular tachycardia)     transient  . Hypothyroidism     History   Social History  . Marital Status: Widowed    Spouse Name: N/A    Number of Children: N/A  . Years of Education: N/A   Occupational History  . Not on file.   Social History Main Topics  . Smoking status: Former Smoker    Types: Cigarettes    Quit date: 05/25/1950  . Smokeless tobacco: Never Used  . Alcohol Use: No  . Drug Use: No  . Sexually Active: Yes   Other Topics Concern  . Not on file   Social History Narrative  . No narrative on file    No past surgical history on file.  Family History  Problem Relation Age of Onset  . Hypertension Mother   . COPD Father     No Known Allergies  Current Outpatient Prescriptions on File Prior to Visit  Medication Sig Dispense Refill  . amLODipine (NORVASC) 10 MG tablet TAKE 1 TABLET BY MOUTH DAILY  30 tablet  7  . aspirin 81 MG tablet Take 162 mg by mouth daily.       [name] atorvastatin (LIPITOR) 20 MG tablet Take 1 tablet (20 mg total) by mouth daily.  90 tablet  3  . Boost (BOOST) LIQD Take 237 mLs by mouth daily.      . carvedilol (COREG) 12.5 MG tablet TAKE 1 TABLET TWICE A DAY  60 tablet  5  . cloNIDine (CATAPRES) 0.1 MG tablet TAKE 1 TABLET BY MOUTH 3 TIMES A DAY  90 tablet  1  . DIOVAN 160 MG tablet TAKE 1 TABLET TWICE A DAY  60 tablet  5  . lactulose (CHRONULAC) 10 GM/15ML solution Take 15 mLs (10 g total) by mouth at bedtime.  300 mL  6  . levETIRAcetam (KEPPRA) 250 MG tablet TAKE 1 TABLET BY MOUTH TWICE A DAY  60 tablet  12  . levothyroxine (SYNTHROID, LEVOTHROID) 25 MCG tablet TAKE 1 TABLET EVERY DAY  30 tablet  5  . nitroGLYCERIN (NITROSTAT) 0.4 MG SL tablet Place 0.4 mg under the tongue every 5 (five) minutes as needed.        . sertraline (ZOLOFT) 25 MG tablet TAKE 1  TABLET BY MOUTH EVERY DAY  30 tablet  2  . sertraline (ZOLOFT) 25 MG tablet TAKE 1 TABLET BY MOUTH EVERY DAY  30 tablet  2  . Vitamin D, Ergocalciferol, (DRISDOL) 50000 UNITS CAPS TAKE 1 CAPSULE BY MOUTH WEEKLY  30 capsule  0   No current facility-administered medications on file prior to visit.    BP 124/76  Pulse 68  Temp(Src) 98 F (36.7 C)  Resp 16  Ht 4\' 9"  (1.448 m)  Wt 142 lb (64.411 kg)  BMI 30.72 kg/m2        Objective:   Physical Exam  Constitutional: She is oriented to person, place, and time. She appears well-developed and well-nourished. No distress.  HENT:  Head: Normocephalic and atraumatic.  Eyes: Conjunctivae and EOM are normal. Pupils are equal, round, and reactive to light.  Neck: Normal range of motion. Neck supple. No JVD present. No tracheal deviation present. No thyromegaly present.  Cardiovascular: Normal rate and regular rhythm.   Murmur heard. Pulmonary/Chest: Effort normal and breath sounds normal. She has no wheezes. She exhibits no tenderness.  Abdominal: Soft. Bowel sounds are normal.  Musculoskeletal: Normal range of motion. She exhibits edema and tenderness.  Lymphadenopathy:    She has no cervical adenopathy.  Neurological: She is alert and oriented to person, place, and time. She has normal reflexes. No cranial nerve deficit.  Skin: Skin is warm and dry. She is not diaphoretic.          Assessment & Plan:  Reviewed the blood work drawn by cardiology and she is up-to-date with all appropriate monitoring.  HTN stable Lipids at goal Stable arthritis Knee pain form from AO Fall risk discussed.

## 2012-07-30 ENCOUNTER — Encounter: Payer: Self-pay | Admitting: Cardiology

## 2012-07-30 ENCOUNTER — Ambulatory Visit (INDEPENDENT_AMBULATORY_CARE_PROVIDER_SITE_OTHER): Payer: Medicare Other | Admitting: Cardiology

## 2012-07-30 VITALS — BP 132/82 | HR 59 | Ht <= 58 in | Wt 141.0 lb

## 2012-07-30 DIAGNOSIS — I25810 Atherosclerosis of coronary artery bypass graft(s) without angina pectoris: Secondary | ICD-10-CM | POA: Diagnosis not present

## 2012-07-30 DIAGNOSIS — E785 Hyperlipidemia, unspecified: Secondary | ICD-10-CM

## 2012-07-30 DIAGNOSIS — I5032 Chronic diastolic (congestive) heart failure: Secondary | ICD-10-CM

## 2012-07-30 DIAGNOSIS — I2510 Atherosclerotic heart disease of native coronary artery without angina pectoris: Secondary | ICD-10-CM | POA: Diagnosis not present

## 2012-07-30 DIAGNOSIS — I6529 Occlusion and stenosis of unspecified carotid artery: Secondary | ICD-10-CM | POA: Diagnosis not present

## 2012-07-30 DIAGNOSIS — I10 Essential (primary) hypertension: Secondary | ICD-10-CM

## 2012-07-30 LAB — BASIC METABOLIC PANEL
CO2: 28 mEq/L (ref 19–32)
Calcium: 9.4 mg/dL (ref 8.4–10.5)
Chloride: 105 mEq/L (ref 96–112)
Sodium: 142 mEq/L (ref 135–145)

## 2012-07-30 NOTE — Patient Instructions (Addendum)
Your physician recommends that you have lab work today-BMET.   Your physician wants you to follow-up in: 6 months with Dr Shirlee Latch. (December 2014). You will receive a reminder letter in the mail two months in advance. If you don't receive a letter, please call our office to schedule the follow-up appointment.

## 2012-07-31 NOTE — Progress Notes (Signed)
Patient ID: [name], female   DOB: [birth year], 79 y.o.   MRN: [id number] PCP: Dr. Lovell Sheehan  79 yo with h/o CAD and TIAs vs atypical migraines presents for followup.  She is living at home by herself though her son lives around the corner.  She is able to walk around her house and to the mailbox without exertional dyspnea.  She can climb a flight of steps.  I had her stop Lasix last year with increased creatinine and weight has remained stable.  She has not had any chest pain.  Last echo showed preserved LV systolic function.  She has had no recent neurologic episodes (TIA vs atypical migraine).  No orthopnea.  No lightheadedness with standing.  Her grand-daughter got married last weekend and she was at the wedding. She uses her cane.  Labs (8/10): K 4.2, creatinine 1.3 Labs (1/11): LDL 36, HDL 63, TSH normal Labs (9/11): K 4.6, creatinine 1.3 Labs (4/12): LDL 43, HDL 57 Labs (10/12): creatinine 1.7 Labs (4/13): LDL 43, HDL 57  Labs (5/13): K 4.9, creatinine 1.5 Labs (1/14): TSH normal, LDL 38, HDL 61  ECG: NSR, normal  Allergies (verified):  1)  ! * Clonidine Patch Glue  Past Medical History: 1.  HTN 2.  CAD:  NSTEMI 3/10.  LHC showed 80% mLAD, 90% mCFX.  She had PCI with Endeavor 2.5 x 12 to LAD and Endeavor 3.0 x 12 to CFX.   3.  Diastolic CHF:  Echo (3/10) EF 40-98% with periapical akinesis.  Mild LVH.  Mild MR.  Pseudonormal diastolic function.  Echo (4/10): EF 65% with mild focal basal septal hypertrophy.  Pseudonormal diastolic function.  Normal wall motion.  Moderate biatrial enlargement.  PASP 61 mmHg.  Echo (4/11): EF 55%, inferobasal hypokinesis, mild MR, PA systolic pressure 44 mmHg.  4.  Frequent falls 5.  Lacunar stroke on CT 6.  Expressive aphasia episodes:  More likely atypical migraine than TIA.  Carotid dopplers (8/'09) with 40-60% LICA stenosis.  Carotid dopplers (11/12) with 40-59% bilateral ICA stenosis.  Carotid dopplers (11/13) with 40-59% bilateral ICA stenosis.  7.   Left Horners syndrome.  8.  Transient SVT 9.  Hypothyroidism.  10. 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  Lives alone but son close by.  Alcohol use-no Drug use-no Regular exercise-no   Current Outpatient Prescriptions  Medication Sig Dispense Refill  . amLODipine (NORVASC) 10 MG tablet TAKE 1 TABLET BY MOUTH DAILY  30 tablet  7  . aspirin 81 MG tablet Take 162 mg by mouth daily.       [name] atorvastatin (LIPITOR) 20 MG tablet Take 1 tablet (20 mg total) by mouth daily.  90 tablet  3  . Boost (BOOST) LIQD Take 237 mLs by mouth daily.      . carvedilol (COREG) 12.5 MG tablet TAKE 1 TABLET TWICE A DAY  60 tablet  5  . cloNIDine (CATAPRES) 0.1 MG tablet TAKE 1 TABLET BY MOUTH 3 TIMES A DAY  90 tablet  1  . DIOVAN 160 MG tablet TAKE 1 TABLET TWICE A DAY  60 tablet  5  . lactulose (CHRONULAC) 10 GM/15ML solution Take 15 mLs (10 g total) by mouth at bedtime.  300 mL  6  . levETIRAcetam (KEPPRA) 250 MG tablet TAKE 1 TABLET BY MOUTH TWICE A DAY  60 tablet  12  .  levothyroxine (SYNTHROID, LEVOTHROID) 25 MCG tablet TAKE 1 TABLET EVERY DAY  30 tablet  5  . nitroGLYCERIN (NITROSTAT) 0.4 MG SL tablet Place 0.4 mg under the tongue every 5 (five) minutes as needed.        . sertraline (ZOLOFT) 25 MG tablet TAKE 1 TABLET BY MOUTH EVERY DAY  30 tablet  2  . Vitamin D, Ergocalciferol, (DRISDOL) 50000 UNITS CAPS TAKE 1 CAPSULE BY MOUTH WEEKLY  30 capsule  0   No current facility-administered medications for this visit.    BP 132/82  Pulse 59  Ht 4\' 9"  (1.448 m)  Wt 141 lb (63.957 kg)  BMI 30.5 kg/m2 General:  Elderly female, no apparent distress.  Neck:  Neck supple, no JVD. No masses, thyromegaly or abnormal cervical nodes. Lungs:  Clear bilaterally to auscultation and percussion. Heart:  Non-displaced PMI, chest non-tender; regular rate and rhythm, S1, S2 without rubs or  gallops. 2/6 early systolic ejection murmur RUSB.  Carotid upstroke normal, no bruit.  Trace ankle edema.  Abdomen:  Bowel sounds positive; abdomen soft and non-tender without masses, organomegaly, or hernias noted. No hepatosplenomegaly. Extremities:  No clubbing or cyanosis. Neurologic:  Alert and oriented x 3. Psych:  Normal affect.  Assessment/Plan:  CORONARY ATHEROSCLEROSIS NATIVE CORONARY ARTERY No ischemic symptoms. Continue ASA, Coreg, ARB, and statin.  HYPERLIPIDEMIA-MIXED  No problems after switch to atorvastatin.  Good lipids in 1/14.  HYPERTENSION BP ok today.  Continue current meds.   DIASTOLIC HEART FAILURE, CHRONIC Weight is stable off Lasix (stopped because of elevated creatinine) and she is symptomatically stable. Will repeat BMET today.  Carotid stenosis  Mild to Moderate carotid stenosis.  This has been stable long-term.  Given age, will not schedule followup dopplers.   Marca Ancona 07/31/2012 6:08 AM

## 2012-08-06 ENCOUNTER — Other Ambulatory Visit: Payer: Self-pay | Admitting: Internal Medicine

## 2012-08-06 ENCOUNTER — Other Ambulatory Visit: Payer: Self-pay | Admitting: Cardiology

## 2012-08-07 ENCOUNTER — Other Ambulatory Visit: Payer: Self-pay

## 2012-08-07 MED ORDER — VALSARTAN 160 MG PO TABS: 160.0000 mg | ORAL_TABLET | Freq: Two times a day (BID) | ORAL | Status: DC

## 2012-08-07 NOTE — Telephone Encounter (Signed)
Kathryn Morale, MD at 07/31/2012  6:11 AM DIOVAN 160 MG tablet  TAKE 1 TABLET TWICE A DAY   60 tablet   5   Your physician wants you to follow-up in: 6 months with Dr Shirlee Latch. (December 2014). You will receive a reminder letter in the mail two months in advance. If you don't receive a letter, please call our office to schedule the follow-up appointment.

## 2012-08-14 ENCOUNTER — Other Ambulatory Visit: Payer: Self-pay | Admitting: Internal Medicine

## 2012-08-14 ENCOUNTER — Other Ambulatory Visit: Payer: Self-pay | Admitting: Cardiology

## 2012-08-14 ENCOUNTER — Encounter: Payer: Self-pay | Admitting: *Deleted

## 2012-08-14 ENCOUNTER — Other Ambulatory Visit: Payer: Self-pay | Admitting: *Deleted

## 2012-09-03 DIAGNOSIS — N814 Uterovaginal prolapse, unspecified: Secondary | ICD-10-CM | POA: Diagnosis not present

## 2012-09-17 ENCOUNTER — Other Ambulatory Visit: Payer: Self-pay

## 2012-09-17 DIAGNOSIS — Z1231 Encounter for screening mammogram for malignant neoplasm of breast: Secondary | ICD-10-CM

## 2012-10-11 ENCOUNTER — Other Ambulatory Visit: Payer: Self-pay | Admitting: Internal Medicine

## 2012-10-13 ENCOUNTER — Other Ambulatory Visit: Payer: Self-pay | Admitting: Internal Medicine

## 2012-10-24 ENCOUNTER — Ambulatory Visit
Admission: RE | Admit: 2012-10-24 | Discharge: 2012-10-24 | Disposition: A | Discharge status: Home / Self Care | Payer: Medicare Other | Source: Ambulatory Visit

## 2012-10-24 DIAGNOSIS — Z1231 Encounter for screening mammogram for malignant neoplasm of breast: Secondary | ICD-10-CM | POA: Diagnosis not present

## 2012-12-10 ENCOUNTER — Other Ambulatory Visit: Payer: Self-pay | Admitting: Internal Medicine

## 2012-12-19 DIAGNOSIS — H2510 Age-related nuclear cataract, unspecified eye: Secondary | ICD-10-CM | POA: Diagnosis not present

## 2012-12-19 DIAGNOSIS — H04129 Dry eye syndrome of unspecified lacrimal gland: Secondary | ICD-10-CM | POA: Diagnosis not present

## 2012-12-30 ENCOUNTER — Other Ambulatory Visit: Payer: Self-pay | Admitting: Cardiology

## 2012-12-31 ENCOUNTER — Other Ambulatory Visit: Payer: Self-pay | Admitting: Cardiology

## 2013-01-19 ENCOUNTER — Other Ambulatory Visit: Payer: Self-pay | Admitting: Cardiology

## 2013-01-19 ENCOUNTER — Ambulatory Visit: Payer: Medicare Other | Admitting: Internal Medicine

## 2013-01-23 ENCOUNTER — Ambulatory Visit (HOSPITAL_COMMUNITY): Payer: Medicare Other | Attending: Cardiovascular Disease

## 2013-01-23 ENCOUNTER — Encounter: Payer: Self-pay | Admitting: Cardiovascular Disease

## 2013-01-23 ENCOUNTER — Ambulatory Visit: Payer: Medicare Other | Admitting: Internal Medicine

## 2013-01-23 DIAGNOSIS — I2510 Atherosclerotic heart disease of native coronary artery without angina pectoris: Secondary | ICD-10-CM | POA: Insufficient documentation

## 2013-01-23 DIAGNOSIS — I6529 Occlusion and stenosis of unspecified carotid artery: Secondary | ICD-10-CM

## 2013-01-23 DIAGNOSIS — I658 Occlusion and stenosis of other precerebral arteries: Secondary | ICD-10-CM | POA: Insufficient documentation

## 2013-01-23 DIAGNOSIS — I10 Essential (primary) hypertension: Secondary | ICD-10-CM | POA: Insufficient documentation

## 2013-01-23 DIAGNOSIS — Z8673 Personal history of transient ischemic attack (TIA), and cerebral infarction without residual deficits: Secondary | ICD-10-CM | POA: Insufficient documentation

## 2013-01-23 DIAGNOSIS — R4701 Aphasia: Secondary | ICD-10-CM | POA: Diagnosis not present

## 2013-01-28 ENCOUNTER — Encounter: Payer: Self-pay | Admitting: Internal Medicine

## 2013-01-28 ENCOUNTER — Ambulatory Visit (INDEPENDENT_AMBULATORY_CARE_PROVIDER_SITE_OTHER): Payer: Medicare Other | Admitting: Internal Medicine

## 2013-01-28 ENCOUNTER — Other Ambulatory Visit: Payer: Self-pay | Admitting: Cardiology

## 2013-01-28 VITALS — BP 140/64 | HR 72 | Temp 98.0°F | Resp 16 | Ht <= 58 in | Wt 136.0 lb

## 2013-01-28 DIAGNOSIS — E039 Hypothyroidism, unspecified: Secondary | ICD-10-CM

## 2013-01-28 DIAGNOSIS — G40109 Localization-related (focal) (partial) symptomatic epilepsy and epileptic syndromes with simple partial seizures, not intractable, without status epilepticus: Secondary | ICD-10-CM

## 2013-01-28 DIAGNOSIS — I658 Occlusion and stenosis of other precerebral arteries: Secondary | ICD-10-CM

## 2013-01-28 DIAGNOSIS — Z23 Encounter for immunization: Secondary | ICD-10-CM

## 2013-01-28 DIAGNOSIS — I6529 Occlusion and stenosis of unspecified carotid artery: Secondary | ICD-10-CM

## 2013-01-28 DIAGNOSIS — I6523 Occlusion and stenosis of bilateral carotid arteries: Secondary | ICD-10-CM

## 2013-01-28 DIAGNOSIS — I10 Essential (primary) hypertension: Secondary | ICD-10-CM

## 2013-01-28 LAB — TSH: TSH: 1.17 u[IU]/mL (ref 0.35–5.50)

## 2013-01-28 NOTE — Patient Instructions (Signed)
The patient is instructed to continue all medications as prescribed. Schedule followup with check out clerk upon leaving the clinic  

## 2013-01-28 NOTE — Progress Notes (Signed)
Pre visit review using our clinic review tool, if applicable. No additional management support is needed unless otherwise documented below in the visit note. 

## 2013-01-28 NOTE — Progress Notes (Signed)
Subjective:    Patient ID: [name], female    DOB: 02-03-1921, 79 y.o.   MRN: [id number]  Hypertension Pertinent negatives include no headaches, neck pain or shortness of breath.  Hyperlipidemia Pertinent negatives include no myalgias or shortness of breath.   Had shingles HTN stable  Lipids at goal Discussion of carotid dz    Review of Systems  Constitutional: Positive for fatigue. Negative for activity change and appetite change.  HENT: Negative for congestion, ear pain, postnasal drip and sinus pressure.   Eyes: Negative for redness and visual disturbance.  Respiratory: Negative for cough, shortness of breath and wheezing.   Gastrointestinal: Negative for abdominal pain and abdominal distention.  Genitourinary: Negative for dysuria, frequency and menstrual problem.  Musculoskeletal: Negative for arthralgias, joint swelling, myalgias and neck pain.  Skin: Negative for rash and wound.  Neurological: Positive for weakness. Negative for dizziness, light-headedness and headaches.  Hematological: Negative for adenopathy. Does not bruise/bleed easily.  Psychiatric/Behavioral: Negative for sleep disturbance and decreased concentration.       Past Medical History  Diagnosis Date  . Hypertension   . Coronary artery disease     NSTEMI 3/10. LHC showed 80% mLAD, 90% mCFX. She had PCI with Endeavor 2.5 x 12 to LAD and Endeavor 3.0 x 12 to CFX  . CHF (congestive heart failure)     Diastolic CHF. Echo (3/10) EF 45-50% with periapical akinesis. Mild LVH. Mild MR. Pseudonormal diastolic function. Echo (4/10): EF 65% with mild focal basal septal hypertrophy. Pseudonormal diastolic function. Normal wall motion. Moderate biatrial enlargement. PASP 61 mmHg. Echo (4/11): EF 55%, inferobasal hypokinesis, mild MR, PA systolic pressure 44 mmHg.  . Frequent falls   . Lacunar stroke     on CT  . Expressive aphasia syndrome     More likely atypical migraine than TIA. Carotid dopplers (8/09)  with 40-60% LICA stenosis.  . Horner's syndrome     left  . SVT (supraventricular tachycardia)     transient  . Hypothyroidism     History   Social History  . Marital Status: Widowed    Spouse Name: N/A    Number of Children: N/A  . Years of Education: N/A   Occupational History  . Not on file.   Social History Main Topics  . Smoking status: Former Smoker    Types: Cigarettes    Quit date: 05/25/1950  . Smokeless tobacco: Never Used  . Alcohol Use: No  . Drug Use: No  . Sexual Activity: Yes   Other Topics Concern  . Not on file   Social History Narrative  . No narrative on file    No past surgical history on file.  Family History  Problem Relation Age of Onset  . Hypertension Mother   . COPD Father     No Known Allergies  Current Outpatient Prescriptions on File Prior to Visit  Medication Sig Dispense Refill  . amLODipine (NORVASC) 10 MG tablet TAKE 1 TABLET BY MOUTH DAILY  30 tablet  7  . amLODipine (NORVASC) 10 MG tablet TAKE 1 TABLET BY MOUTH DAILY  30 tablet  2  . aspirin 81 MG tablet Take 162 mg by mouth daily.       [name] atorvastatin (LIPITOR) 20 MG tablet TAKE 1 TABLET BY MOUTH EVERY DAY  90 tablet  0  . Boost (BOOST) LIQD Take 237 mLs by mouth daily.      . carvedilol (COREG) 12.5 MG tablet TAKE 1 TABLET BY MOUTH  TWICE A DAY  60 tablet  3  . cloNIDine (CATAPRES) 0.1 MG tablet Take 0.1 mg by mouth 3 (three) times daily.      [name] lactulose (CHRONULAC) 10 GM/15ML solution Take 15 mLs (10 g total) by mouth at bedtime.  300 mL  6  . levETIRAcetam (KEPPRA) 250 MG tablet TAKE 1 TABLET BY MOUTH TWICE A DAY  60 tablet  12  . levothyroxine (SYNTHROID, LEVOTHROID) 25 MCG tablet Take 25 mcg by mouth daily before breakfast.      . nitroGLYCERIN (NITROSTAT) 0.4 MG SL tablet Place 0.4 mg under the tongue every 5 (five) minutes as needed.        . sertraline (ZOLOFT) 25 MG tablet TAKE 1 TABLET BY MOUTH EVERY DAY  30 tablet  2  . valsartan (DIOVAN) 160 MG tablet Take 1  tablet (160 mg total) by mouth 2 (two) times daily.  60 tablet  11  . Vitamin D, Ergocalciferol, (DRISDOL) 50000 UNITS CAPS capsule TAKE ONE CAPSULE BY MOUTH WEEKLY  30 capsule  3  . Vitamin D, Ergocalciferol, (DRISDOL) 50000 UNITS CAPS capsule TAKE ONE CAPSULE BY MOUTH WEEKLY  30 capsule  1   No current facility-administered medications on file prior to visit.    BP 140/64  Pulse 72  Temp(Src) 98 F (36.7 C)  Resp 16  Ht 4\' 9"  (1.448 m)  Wt 136 lb (61.689 kg)  BMI 29.42 kg/m2    Objective:   Physical Exam  Nursing note and vitals reviewed. Constitutional: She is oriented to person, place, and time.  HENT:  Head: Normocephalic and atraumatic.  Neck: No JVD present.  bruits  Cardiovascular: Normal rate and regular rhythm.   Murmur heard. Pulmonary/Chest: Effort normal and breath sounds normal.  Abdominal: Soft. Bowel sounds are normal.  Neurological: She is alert and oriented to person, place, and time.  Skin: Skin is warm and dry.          Assessment & Plan:  This is doing well from the standpoint of her hypertension and her lipid control we reviewed the results of her lipids in January she'll be due for repeat lipids next year.  She had a carotid Doppler showing that her carotid stenosis is approximately 50% bilaterally at the bifurcation she is no symptomology including dizziness on standing and her cardiologist believes that we'll continue to monitor this and continue to control blood pressure and cholesterol.

## 2013-02-09 ENCOUNTER — Other Ambulatory Visit: Payer: Self-pay | Admitting: Internal Medicine

## 2013-02-27 ENCOUNTER — Other Ambulatory Visit: Payer: Self-pay | Admitting: Cardiology

## 2013-02-27 ENCOUNTER — Other Ambulatory Visit: Payer: Self-pay | Admitting: Internal Medicine

## 2013-03-12 ENCOUNTER — Other Ambulatory Visit: Payer: Self-pay | Admitting: Cardiology

## 2013-04-04 ENCOUNTER — Other Ambulatory Visit: Payer: Self-pay | Admitting: Cardiology

## 2013-04-13 ENCOUNTER — Other Ambulatory Visit: Payer: Self-pay | Admitting: Cardiology

## 2013-05-04 ENCOUNTER — Other Ambulatory Visit: Payer: Self-pay | Admitting: Cardiology

## 2013-05-04 ENCOUNTER — Other Ambulatory Visit: Payer: Self-pay | Admitting: Internal Medicine

## 2013-05-13 ENCOUNTER — Telehealth: Payer: Self-pay | Admitting: Internal Medicine

## 2013-05-13 MED ORDER — DONEPEZIL HCL 5 MG PO TABS: 5.0000 mg | ORAL_TABLET | Freq: Every day | ORAL | Status: DC

## 2013-05-13 NOTE — Telephone Encounter (Signed)
Per Dr Lovell SheehanJenkins ok to send in aricept 5 mg once daily #30/5 refills, also wants pt to come in for a u/a.  Son aware, rx sent in electronically, lab appt scheduled for tomorrow

## 2013-05-13 NOTE — Telephone Encounter (Signed)
Son states pt is begining to imagine thinks, then goes back to reality. Her mind wanders, but she will snap back.  Son  Is hoping there is a mild med pt can be put on to help subside these symptoms before it gets really bad.  Pt has "mild dementia", but they are beginning to notice this mind wandering from time to time. Son can be reached after 6:30 pm if Dr Lovell SheehanJenkins would call him to discuss.

## 2013-05-14 ENCOUNTER — Telehealth: Payer: Self-pay | Admitting: Internal Medicine

## 2013-05-14 ENCOUNTER — Other Ambulatory Visit: Payer: Self-pay | Admitting: Internal Medicine

## 2013-05-14 ENCOUNTER — Other Ambulatory Visit: Payer: Self-pay | Admitting: General Practice

## 2013-05-14 ENCOUNTER — Other Ambulatory Visit (INDEPENDENT_AMBULATORY_CARE_PROVIDER_SITE_OTHER): Payer: Medicare Other

## 2013-05-14 DIAGNOSIS — N390 Urinary tract infection, site not specified: Secondary | ICD-10-CM | POA: Diagnosis not present

## 2013-05-14 LAB — POCT URINALYSIS DIPSTICK
Bilirubin, UA: NEGATIVE
Glucose, UA: NEGATIVE
KETONES UA: NEGATIVE
Nitrite, UA: POSITIVE
PH UA: 5.5
SPEC GRAV UA: 1.02
UROBILINOGEN UA: 0.2

## 2013-05-14 MED ORDER — CIPROFLOXACIN HCL 500 MG PO TABS: 500.0000 mg | ORAL_TABLET | Freq: Two times a day (BID) | ORAL | Status: DC

## 2013-05-22 NOTE — Telephone Encounter (Signed)
Error/gd °

## 2013-05-30 ENCOUNTER — Other Ambulatory Visit: Payer: Self-pay | Admitting: Cardiology

## 2013-06-01 ENCOUNTER — Other Ambulatory Visit: Payer: Self-pay | Admitting: Cardiology

## 2013-06-02 ENCOUNTER — Other Ambulatory Visit: Payer: Self-pay | Admitting: *Deleted

## 2013-06-02 ENCOUNTER — Other Ambulatory Visit: Payer: Self-pay | Admitting: Cardiology

## 2013-06-02 MED ORDER — ATORVASTATIN CALCIUM 20 MG PO TABS: ORAL_TABLET | ORAL | Status: DC

## 2013-06-02 MED ORDER — AMLODIPINE BESYLATE 10 MG PO TABS: ORAL_TABLET | ORAL | Status: DC

## 2013-06-02 MED ORDER — CARVEDILOL 12.5 MG PO TABS: ORAL_TABLET | ORAL | Status: DC

## 2013-06-20 ENCOUNTER — Other Ambulatory Visit: Payer: Self-pay | Admitting: Neurology

## 2013-06-22 ENCOUNTER — Telehealth: Payer: Self-pay | Admitting: Neurology

## 2013-06-22 NOTE — Telephone Encounter (Signed)
Patient has not been seen in over 1 year.  Last OV says: She was started on Keppra and has had no further seizures. She will continue on her medications without change. She is doing well,   return to clinic in one year I called back.  Spoke with Molson Coors BrewingMelanie.  She said she will speak to son about the Rx.  She is unsure if they are confusing this with a different medication or not.  Will call us back if needed.  Will also ask them to schedule an annual appt.

## 2013-06-22 NOTE — Telephone Encounter (Signed)
States they received an RX levETIRAcetam (KEPPRA) 250 MG tablet and the patient contacted them and said the directions have changed and they take it 3times a day.

## 2013-06-23 ENCOUNTER — Encounter (HOSPITAL_COMMUNITY): Payer: Self-pay | Admitting: Emergency Medicine

## 2013-06-23 ENCOUNTER — Emergency Department (HOSPITAL_COMMUNITY)
Admission: EM | Admit: 2013-06-23 | Discharge: 2013-06-23 | Disposition: A | Discharge status: Home / Self Care | Payer: Medicare Other | Attending: Emergency Medicine | Admitting: Emergency Medicine

## 2013-06-23 DIAGNOSIS — F0390 Unspecified dementia without behavioral disturbance: Secondary | ICD-10-CM | POA: Insufficient documentation

## 2013-06-23 DIAGNOSIS — Z79899 Other long term (current) drug therapy: Secondary | ICD-10-CM | POA: Insufficient documentation

## 2013-06-23 DIAGNOSIS — Z9181 History of falling: Secondary | ICD-10-CM | POA: Diagnosis not present

## 2013-06-23 DIAGNOSIS — Z8673 Personal history of transient ischemic attack (TIA), and cerebral infarction without residual deficits: Secondary | ICD-10-CM | POA: Insufficient documentation

## 2013-06-23 DIAGNOSIS — N939 Abnormal uterine and vaginal bleeding, unspecified: Secondary | ICD-10-CM

## 2013-06-23 DIAGNOSIS — N72 Inflammatory disease of cervix uteri: Secondary | ICD-10-CM | POA: Diagnosis not present

## 2013-06-23 DIAGNOSIS — I509 Heart failure, unspecified: Secondary | ICD-10-CM | POA: Diagnosis not present

## 2013-06-23 DIAGNOSIS — Z87891 Personal history of nicotine dependence: Secondary | ICD-10-CM | POA: Diagnosis not present

## 2013-06-23 DIAGNOSIS — N898 Other specified noninflammatory disorders of vagina: Secondary | ICD-10-CM | POA: Insufficient documentation

## 2013-06-23 DIAGNOSIS — N814 Uterovaginal prolapse, unspecified: Secondary | ICD-10-CM | POA: Diagnosis not present

## 2013-06-23 DIAGNOSIS — E039 Hypothyroidism, unspecified: Secondary | ICD-10-CM | POA: Insufficient documentation

## 2013-06-23 DIAGNOSIS — Z7982 Long term (current) use of aspirin: Secondary | ICD-10-CM | POA: Insufficient documentation

## 2013-06-23 DIAGNOSIS — Z8669 Personal history of other diseases of the nervous system and sense organs: Secondary | ICD-10-CM | POA: Insufficient documentation

## 2013-06-23 DIAGNOSIS — I10 Essential (primary) hypertension: Secondary | ICD-10-CM | POA: Diagnosis not present

## 2013-06-23 DIAGNOSIS — I2510 Atherosclerotic heart disease of native coronary artery without angina pectoris: Secondary | ICD-10-CM | POA: Insufficient documentation

## 2013-06-23 HISTORY — DX: Unspecified dementia, unspecified severity, without behavioral disturbance, psychotic disturbance, mood disturbance, and anxiety: F03.90

## 2013-06-23 NOTE — ED Notes (Signed)
Pt sattes that she noticed blood coming from her Vagina tonight following going to the bathroom.  Pt states she thinks her pessary might have turned wrong in the night and cut into her.

## 2013-06-23 NOTE — ED Provider Notes (Signed)
CSN: [id number]     Arrival date & time 06/23/13  0608 History   First MD Initiated Contact with Patient 06/23/13 [phone]     Chief Complaint  Patient presents with  . Vaginal Bleeding     (Consider location/radiation/quality/duration/timing/severity/associated sxs/prior Treatment) HPI [name] is a 80 y.o. female who presents to emergency department complaining of vaginal bleeding. Patient states she went to the bathroom this morning and noticed that she had blood on the tissue when she wiped. She states that she has a pessary and when she put her finger up her vaginal cavity, she noticed that the pessary was rotated" states was coming out. Patient states she continued to bleed. She called her son who brought her to emergency department. Patient states she had a pessary placed a few years ago by Dr. Maurice MarchLane. She denies any prior vaginal bleeding. She denies any abdominal pain. She denies any pain in her vaginal canal. She denies any other complaints.  Past Medical History  Diagnosis Date  . Hypertension   . Coronary artery disease     NSTEMI 3/10. LHC showed 80% mLAD, 90% mCFX. She had PCI with Endeavor 2.5 x 12 to LAD and Endeavor 3.0 x 12 to CFX  . CHF (congestive heart failure)     Diastolic CHF. Echo (3/10) EF 45-50% with periapical akinesis. Mild LVH. Mild MR. Pseudonormal diastolic function. Echo (4/10): EF 65% with mild focal basal septal hypertrophy. Pseudonormal diastolic function. Normal wall motion. Moderate biatrial enlargement. PASP 61 mmHg. Echo (4/11): EF 55%, inferobasal hypokinesis, mild MR, PA systolic pressure 44 mmHg.  . Frequent falls   . Lacunar stroke     on CT  . Expressive aphasia syndrome     More likely atypical migraine than TIA. Carotid dopplers (8/09) with 40-60% LICA stenosis.  . Horner's syndrome     left  . SVT (supraventricular tachycardia)     transient  . Hypothyroidism   . Dementia    History reviewed. No pertinent past surgical history. Family  History  Problem Relation Age of Onset  . Hypertension Mother   . COPD Father    History  Substance Use Topics  . Smoking status: Former Smoker    Types: Cigarettes    Quit date: 05/25/1950  . Smokeless tobacco: Never Used  . Alcohol Use: No   OB History   Grav Para Term Preterm Abortions TAB SAB Ect Mult Living                 Review of Systems  Constitutional: Negative for fever, chills, activity change and appetite change.  Gastrointestinal: Negative for nausea, vomiting and abdominal pain.  Genitourinary: Positive for vaginal bleeding. Negative for vaginal pain and pelvic pain.  Neurological: Negative for dizziness, light-headedness and headaches.  All other systems reviewed and are negative.     Allergies  Review of patient's allergies indicates no known allergies.  Home Medications   Prior to Admission medications   Medication Sig Start Date End Date Taking? Authorizing Provider  amLODipine (NORVASC) 10 MG tablet Take 10 mg by mouth daily.   Yes Historical Provider, MD  aspirin 81 MG tablet Take 162 mg by mouth daily.    Yes Historical Provider, MD  atorvastatin (LIPITOR) 20 MG tablet Take 20 mg by mouth daily.   Yes Historical Provider, MD  carvedilol (COREG) 12.5 MG tablet Take 12.5 mg by mouth 2 (two) times daily with a meal.   Yes Historical Provider, MD  cloNIDine (CATAPRES) 0.1  MG tablet Take 0.1 mg by mouth 3 (three) times daily.   Yes Historical Provider, MD  donepezil (ARICEPT) 5 MG tablet Take 1 tablet (5 mg total) by mouth at bedtime. 05/13/13  Yes Stacie GlazeJohn E Jenkins, MD  levETIRAcetam (KEPPRA) 250 MG tablet Take 250 mg by mouth 2 (two) times daily.   Yes Historical Provider, MD  levothyroxine (SYNTHROID, LEVOTHROID) 25 MCG tablet Take 25 mcg by mouth daily before breakfast.   Yes Historical Provider, MD  nitroGLYCERIN (NITROSTAT) 0.4 MG SL tablet Place 0.4 mg under the tongue every 5 (five) minutes as needed for chest pain.    Yes Historical Provider, MD   sertraline (ZOLOFT) 25 MG tablet Take 25 mg by mouth daily.   Yes Historical Provider, MD  valsartan (DIOVAN) 160 MG tablet Take 1 tablet (160 mg total) by mouth 2 (two) times daily. 08/07/12  Yes Laurey Moralealton S McLean, MD  Vitamin D, Ergocalciferol, (DRISDOL) 50000 UNITS CAPS capsule Take 50,000 Units by mouth every 7 (seven) days. On saturdays   Yes Historical Provider, MD   BP 178/51  Temp(Src) 97.7 F (36.5 C) (Oral)  Resp 18  SpO2 99% Physical Exam  Nursing note and vitals reviewed. Constitutional: She appears well-developed and well-nourished. No distress.  HENT:  Head: Normocephalic.  Eyes: Conjunctivae are normal.  Neck: Neck supple.  Cardiovascular: Normal rate, regular rhythm and normal heart sounds.   Pulmonary/Chest: Effort normal and breath sounds normal. No respiratory distress. She has no wheezes. She has no rales.  Abdominal: Soft. Bowel sounds are normal. She exhibits no distension. There is no tenderness. There is no rebound.  Genitourinary:  Pessary is present in vaginal canal, there is moderate blood in vaginal canal with small clots. Cervix is normal with multiple polyps over cervix and vaginal walls.    Musculoskeletal: She exhibits no edema.  Neurological: She is alert.  Skin: Skin is warm and dry.  Psychiatric: She has a normal mood and affect. Her behavior is normal.    ED Course  Procedures (including critical care time) Labs Review Labs Reviewed - No data to display  Imaging Review No results found.   EKG Interpretation None      MDM   Final diagnoses:  None    8:04 AM Spoke with Dr. Ernestina PennaFogleman, OB/GYN, recommended to follow up today in the office. Asked to make sure pt is not hemorrhaging. Pelvic exam repeated. Blood cleared using large swabs, no active hemorrhaging. No definite vaginal wall lacerations. Pt has multiple polyps in vaginal canal. Will need further recheck.   Pt's BP elevated. She has not taken her medications yet. She did take them  while in ED. Dicussed plan with pt's son. Will d/c home with close follow up today. Instructed to go to [facility]U if symptoms worsening.    Filed Vitals:   06/23/13 0615 06/23/13 0733 06/23/13 0819  BP: 178/51 162/55 157/49  Pulse:  62 68  Temp: 97.7 F (36.5 C)  97.9 F (36.6 C)  TempSrc: Oral    Resp: 18 18 18   SpO2: 99% 99% 97%        Lottie Musselatyana A Esgar Barnick, PA-C 06/23/13 1621

## 2013-06-23 NOTE — ED Provider Notes (Signed)
Medical screening examination/treatment/procedure(s) were conducted as a shared visit with non-physician practitioner(s) and myself.  I personally evaluated the patient during the encounter.   EKG Interpretation None      This a 80 year old female who presents with vaginal bleeding. She is nontoxic on exam. Her PA, noted to have pessary at vaginal introitus during initial exam.  Pessary was removed. There was a small amount of bleeding noted with unknown source. Pt did not appear to be bleeding from the cervical os on my inspection.  Discuss with OB/GYN. Patient will follow up today in clinic.  Shon Batonourtney F Ellora Varnum, MD 06/24/13 [phone]

## 2013-06-23 NOTE — Discharge Instructions (Signed)
Please follow up with Debbora DusBeth Lane or Dr. Ernestina PennaFogleman TODAY in the office. Call to get a time apt. [facility] if bleeding worsening or develop any pain.

## 2013-06-29 DIAGNOSIS — N814 Uterovaginal prolapse, unspecified: Secondary | ICD-10-CM | POA: Diagnosis not present

## 2013-07-03 DIAGNOSIS — N814 Uterovaginal prolapse, unspecified: Secondary | ICD-10-CM | POA: Diagnosis not present

## 2013-07-04 ENCOUNTER — Other Ambulatory Visit: Payer: Self-pay | Admitting: Internal Medicine

## 2013-07-04 ENCOUNTER — Other Ambulatory Visit: Payer: Self-pay | Admitting: Cardiology

## 2013-07-13 ENCOUNTER — Other Ambulatory Visit: Payer: Self-pay | Admitting: Cardiology

## 2013-07-17 ENCOUNTER — Encounter: Payer: Self-pay | Admitting: Neurology

## 2013-07-17 ENCOUNTER — Ambulatory Visit (INDEPENDENT_AMBULATORY_CARE_PROVIDER_SITE_OTHER): Payer: Medicare Other | Admitting: Neurology

## 2013-07-17 VITALS — BP 164/64 | HR 61 | Ht <= 58 in | Wt 136.0 lb

## 2013-07-17 DIAGNOSIS — Z8679 Personal history of other diseases of the circulatory system: Secondary | ICD-10-CM

## 2013-07-17 DIAGNOSIS — G40109 Localization-related (focal) (partial) symptomatic epilepsy and epileptic syndromes with simple partial seizures, not intractable, without status epilepticus: Secondary | ICD-10-CM | POA: Diagnosis not present

## 2013-07-17 DIAGNOSIS — R413 Other amnesia: Secondary | ICD-10-CM | POA: Insufficient documentation

## 2013-07-17 MED ORDER — LEVETIRACETAM 250 MG PO TABS: 250.0000 mg | ORAL_TABLET | Freq: Two times a day (BID) | ORAL | Status: DC

## 2013-07-17 NOTE — Progress Notes (Signed)
PATIENT: [name] DOB: 03/08/1920  HISTORICAL  [name]  is an 80 -year-old right-handed Caucasian female with history of complex partial seizures that occurred in April, 2010. She was started on Keppra and has had no further seizures.   On June 25, 2008, she presented to the ER with complex partial seizure. At that time she reportedly had a few years history of intermittent episode off difficulty talking,  that might last about 30 minutes, but never experienced total loss of consciousness. However on April 30, she developed episode of difficulty talking again, followed by head deviation to the right side, whole-body tonic-clonic movements.  The events lasted a few minutes and then she had post-ictal confusion.  She had 3 similar recurrent episodes in 2 hours, followed by transient mild right-sided paralysis.  A MRI of the brain  revealed mild chronic small vessel disease but no acute lesions. She most likely has complex partial seizure stemming from the left frontal region.  She was started on Keppra 250 b.i.d and has no recurrent episodes. Because of her mild right side paralysis she was admitted to a nursing facility for a few months but recovered well and is now back at home. She lives alone with family support.   She is dong well, lives alone, independent in her daily activity, her son check on her regularly.  UPDATE Feb 5th 2014. She is doing very well, no recurrent seizures, lives alone at her house, son checks on her regularly, she is taking keppra 250mg  bid. she enjoys reading, relies on a cane.  UPDATE May 22nd 2015: She has no recurrent seizure, gradual onset mild memory trouble, getting much worse during the most recent UTI, is on donepezil 5 mg every night, complains of nightmare    REVIEW OF SYSTEMS: Full 14 system review of systems performed and notable only for mild memory trouble, urinary urgency  ALLERGIES: No Known Allergies  HOME MEDICATIONS: Current  Outpatient Prescriptions on File Prior to Visit  Medication Sig Dispense Refill  . amLODipine (NORVASC) 10 MG tablet Take 10 mg by mouth daily.      10-16-1986 amLODipine (NORVASC) 10 MG tablet TAKE 1 TABLET BY MOUTH DAILY  30 tablet  0  . aspirin 81 MG tablet Take 162 mg by mouth daily.       [name] atorvastatin (LIPITOR) 20 MG tablet Take 20 mg by mouth daily.      [name] atorvastatin (LIPITOR) 20 MG tablet TAKE 1 TABLET BY MOUTH EVERY DAY  30 tablet  0  . carvedilol (COREG) 12.5 MG tablet Take 12.5 mg by mouth 2 (two) times daily with a meal.      . cloNIDine (CATAPRES) 0.1 MG tablet Take 0.1 mg by mouth 3 (three) times daily.      [name] donepezil (ARICEPT) 5 MG tablet Take 1 tablet (5 mg total) by mouth at bedtime.  30 tablet  5  . levETIRAcetam (KEPPRA) 250 MG tablet Take 250 mg by mouth 2 (two) times daily.      [name] levothyroxine (SYNTHROID, LEVOTHROID) 25 MCG tablet TAKE 1 TABLET EVERY DAY  30 tablet  5  . nitroGLYCERIN (NITROSTAT) 0.4 MG SL tablet Place 0.4 mg under the tongue every 5 (five) minutes as needed for chest pain.       [name] sertraline (ZOLOFT) 25 MG tablet Take 25 mg by mouth daily.      . valsartan (DIOVAN) 160 MG tablet Take 1 tablet (160 mg total) by mouth 2 (  two) times daily.  60 tablet  11  . Vitamin D, Ergocalciferol, (DRISDOL) 50000 UNITS CAPS capsule Take 50,000 Units by mouth every 7 (seven) days. On saturdays       No current facility-administered medications on file prior to visit.    PAST MEDICAL HISTORY: Past Medical History  Diagnosis Date  . Hypertension   . Coronary artery disease     NSTEMI 3/10. LHC showed 80% mLAD, 90% mCFX. She had PCI with Endeavor 2.5 x 12 to LAD and Endeavor 3.0 x 12 to CFX  . CHF (congestive heart failure)     Diastolic CHF. Echo (3/10) EF 45-50% with periapical akinesis. Mild LVH. Mild MR. Pseudonormal diastolic function. Echo (4/10): EF 65% with mild focal basal septal hypertrophy. Pseudonormal diastolic function. Normal wall motion. Moderate biatrial  enlargement. PASP 61 mmHg. Echo (4/11): EF 55%, inferobasal hypokinesis, mild MR, PA systolic pressure 44 mmHg.  . Frequent falls   . Lacunar stroke     on CT  . Expressive aphasia syndrome     More likely atypical migraine than TIA. Carotid dopplers (8/09) with 40-60% LICA stenosis.  . Horner's syndrome     left  . SVT (supraventricular tachycardia)     transient  . Hypothyroidism   . Dementia     PAST SURGICAL HISTORY: History reviewed. No pertinent past surgical history.  FAMILY HISTORY: Family History  Problem Relation Age of Onset  . Hypertension Mother   . COPD Father     SOCIAL HISTORY:  History   Social History  . Marital Status: Widowed    Spouse Name: N/A    Number of Children: N/A  . Years of Education: N/A   Occupational History  . Not on file.   Social History Main Topics  . Smoking status: Former Smoker    Types: Cigarettes    Quit date: 05/25/1950  . Smokeless tobacco: Never Used  . Alcohol Use: No  . Drug Use: No  . Sexual Activity: Yes   Other Topics Concern  . Not on file   Social History Narrative  . No narrative on file     PHYSICAL EXAM   Filed Vitals:   07/17/13 0816  BP: 164/64  Pulse: 61  Height: 4\' 1"  (1.245 m)  Weight: 136 lb (61.689 kg)    Not recorded    Body mass index is 39.8 kg/(m^2).   Generalized: In no acute distress  Neck: Supple, no carotid bruits   Cardiac: Regular rate rhythm  Pulmonary: Clear to auscultation bilaterally  Musculoskeletal: No deformity  Neurological examination  Mentation: Alert oriented to time, place, history taking, and causual conversation  Cranial nerve II-XII: Pupils were equal round reactive to light. Extraocular movements were full.  Visual field were full on confrontational test. Bilateral fundi were sharp.  Facial sensation and strength were normal. Hearing was intact to finger rubbing bilaterally. Uvula tongue midline.  Head turning and shoulder shrug and were normal and  symmetric.Tongue protrusion into cheek strength was normal.  Motor: Normal tone, bulk and strength.  Sensory: Intact to fine touch, pinprick, preserved vibratory sensation, and proprioception at toes.  Coordination: Normal finger to nose, heel-to-shin bilaterally there was no truncal ataxia  Gait: Rising up from seated position without assistance, normal stance, without trunk ataxia, moderate stride, good arm swing, smooth turning, able to perform tiptoe, and heel walking without difficulty.   Romberg signs: Negative  Deep tendon reflexes: Brachioradialis 2/2, biceps 2/2, triceps 2/2, patellar 2/2, Achilles 2/2, plantar responses  were flexor bilaterally.   DIAGNOSTIC DATA (LABS, IMAGING, TESTING) - I reviewed patient records, labs, notes, testing and imaging myself where available.  Lab Results  Component Value Date   WBC 8.8 07/25/2011   HGB 12.2 07/25/2011   HCT 36.7 07/25/2011   MCV 91.1 07/25/2011   PLT 233.0 07/25/2011      Component Value Date/Time   NA 142 07/30/2012 1139   K 4.3 07/30/2012 1139   CL 105 07/30/2012 1139   CO2 28 07/30/2012 1139   GLUCOSE 89 07/30/2012 1139   GLUCOSE 108* 03/05/2006 0926   BUN 32* 07/30/2012 1139   CREATININE 1.4* 07/30/2012 1139   CALCIUM 9.4 07/30/2012 1139   PROT 7.7 03/24/2012 1159   ALBUMIN 4.0 03/24/2012 1159   AST 23 03/24/2012 1159   ALT 20 03/24/2012 1159   ALKPHOS 69 03/24/2012 1159   BILITOT 0.4 03/24/2012 1159   GFRNONAA 41.52 11/09/2009 0000   GFRAA  Value: >60        The eGFR has been calculated using the MDRD equation. This calculation has not been validated in all clinical situations. eGFR's persistently <60 mL/min signify possible Chronic Kidney Disease. 06/25/2008 1330   Lab Results  Component Value Date   CHOL 123 03/24/2012   HDL 60.70 03/24/2012   LDLCALC 38 03/24/2012   LDLDIRECT 36.0 03/22/2009   TRIG 120.0 03/24/2012   CHOLHDL 2 03/24/2012   Lab Results  Component Value Date   HGBA1C  Value: 5.1 (NOTE) The ADA recommends the  following therapeutic goal for glycemic control related to Hgb A1c measurement: Goal of therapy: <6.5 Hgb A1c  Reference: American Diabetes Association: Clinical Practice Recommendations 2010, Diabetes Care, 2010, 33: (Suppl  1). 06/25/2008   Lab Results  Component Value Date   VITAMINB12 1268* 11/03/2007   Lab Results  Component Value Date   TSH 1.17 01/28/2013      ASSESSMENT AND PLAN  AIDEL DAVISSON is a 80 y.o. female complains of complex partial seizure, doing very well on low-dose Keppra 250 twice a day, refilled her prescription for one year, she also had the gradual onset mild memory trouble, much worsened due to a UTI, is on low-dose donepezil 5 mg every day, complains of nightmares, we will move it to morning after breakfast, keep low dose donepezil 5 mg, return to clinic in one year, laboratory evaluation B12, I will call her result.   Marcial Pacas, M.D. Ph.D.  [facility] [address] [place], [place] [address] [phone]

## 2013-07-18 ENCOUNTER — Other Ambulatory Visit: Payer: Self-pay | Admitting: Cardiology

## 2013-07-18 ENCOUNTER — Other Ambulatory Visit: Payer: Self-pay | Admitting: Neurology

## 2013-07-18 LAB — FOLATE: Folate: >19.9 ng/mL (ref >3.0)

## 2013-07-18 LAB — VITAMIN B12: VITAMIN B 12: 1382 pg/mL — AB (ref 211–946)

## 2013-07-22 NOTE — Progress Notes (Signed)
Quick Note:  Called to share normal labs per Dr Westly Pam understanding ______

## 2013-07-30 ENCOUNTER — Other Ambulatory Visit: Payer: Self-pay | Admitting: Cardiology

## 2013-08-08 ENCOUNTER — Other Ambulatory Visit: Payer: Self-pay | Admitting: Internal Medicine

## 2013-08-15 ENCOUNTER — Other Ambulatory Visit: Payer: Self-pay | Admitting: Cardiology

## 2013-08-16 ENCOUNTER — Other Ambulatory Visit: Payer: Self-pay | Admitting: Cardiology

## 2013-08-21 ENCOUNTER — Encounter: Payer: Self-pay | Admitting: Family

## 2013-08-21 ENCOUNTER — Ambulatory Visit: Payer: Medicare Other | Admitting: Internal Medicine

## 2013-08-21 ENCOUNTER — Ambulatory Visit (INDEPENDENT_AMBULATORY_CARE_PROVIDER_SITE_OTHER): Payer: Medicare Other | Admitting: Family

## 2013-08-21 VITALS — BP 144/70 | HR 57 | Ht <= 58 in | Wt 134.0 lb

## 2013-08-21 DIAGNOSIS — I10 Essential (primary) hypertension: Secondary | ICD-10-CM | POA: Diagnosis not present

## 2013-08-21 DIAGNOSIS — F329 Major depressive disorder, single episode, unspecified: Secondary | ICD-10-CM | POA: Diagnosis not present

## 2013-08-21 DIAGNOSIS — E039 Hypothyroidism, unspecified: Secondary | ICD-10-CM | POA: Diagnosis not present

## 2013-08-21 DIAGNOSIS — E7800 Pure hypercholesterolemia, unspecified: Secondary | ICD-10-CM | POA: Diagnosis not present

## 2013-08-21 DIAGNOSIS — F3289 Other specified depressive episodes: Secondary | ICD-10-CM

## 2013-08-21 LAB — BASIC METABOLIC PANEL
BUN: 30 mg/dL — ABNORMAL HIGH (ref 6–23)
CO2: 27 mEq/L (ref 19–32)
Calcium: 9.3 mg/dL (ref 8.4–10.5)
Chloride: 104 mEq/L (ref 96–112)
Creatinine, Ser: 1.3 mg/dL — ABNORMAL HIGH (ref 0.4–1.2)
GFR: 41.92 mL/min — AB (ref >60.00)
Glucose, Bld: 92 mg/dL (ref 70–99)
POTASSIUM: 4.5 meq/L (ref 3.5–5.1)
SODIUM: 141 meq/L (ref 135–145)

## 2013-08-21 LAB — HEPATIC FUNCTION PANEL
ALK PHOS: 56 U/L (ref 39–117)
ALT: 17 U/L (ref 0–35)
AST: 21 U/L (ref 0–37)
Albumin: 3.6 g/dL (ref 3.5–5.2)
Bilirubin, Direct: 0 mg/dL (ref 0.0–0.3)
Total Bilirubin: 0.5 mg/dL (ref 0.2–1.2)
Total Protein: 7 g/dL (ref 6.0–8.3)

## 2013-08-21 LAB — LIPID PANEL
Cholesterol: 113 mg/dL (ref 0–200)
HDL: 65 mg/dL (ref >39.00)
LDL CALC: 23 mg/dL (ref 0–99)
NonHDL: 48
Total CHOL/HDL Ratio: 2
Triglycerides: 125 mg/dL (ref 0.0–149.0)
VLDL: 25 mg/dL (ref 0.0–40.0)

## 2013-08-21 LAB — TSH: TSH: 0.67 u[IU]/mL (ref 0.35–4.50)

## 2013-08-21 NOTE — Patient Instructions (Signed)

## 2013-08-21 NOTE — Progress Notes (Signed)
Pre visit review using our clinic review tool, if applicable. No additional management support is needed unless otherwise documented below in the visit note. 

## 2013-08-21 NOTE — Progress Notes (Signed)
Subjective:    Patient ID: [name], female    DOB: 06/05/1920, 80 y.o.   MRN: [id number]  HPI 80 year old white female, nonsmoker, with a history of hypertension, hypothyroidism, hypercholesteremia, and depression. Denies any concerns and tolerates medication well.    Review of Systems  Constitutional: Negative.   HENT: Negative.   Respiratory: Negative.   Cardiovascular: Negative.   Gastrointestinal: Negative.   Endocrine: Negative.   Skin: Negative.   Allergic/Immunologic: Negative.   Neurological: Negative.   Psychiatric/Behavioral: Negative.    Past Medical History  Diagnosis Date  . Hypertension   . Coronary artery disease     NSTEMI 3/10. LHC showed 80% mLAD, 90% mCFX. She had PCI with Endeavor 2.5 x 12 to LAD and Endeavor 3.0 x 12 to CFX  . CHF (congestive heart failure)     Diastolic CHF. Echo (3/10) EF 45-50% with periapical akinesis. Mild LVH. Mild MR. Pseudonormal diastolic function. Echo (4/10): EF 65% with mild focal basal septal hypertrophy. Pseudonormal diastolic function. Normal wall motion. Moderate biatrial enlargement. PASP 61 mmHg. Echo (4/11): EF 55%, inferobasal hypokinesis, mild MR, PA systolic pressure 44 mmHg.  . Frequent falls   . Lacunar stroke     on CT  . Expressive aphasia syndrome     More likely atypical migraine than TIA. Carotid dopplers (8/09) with 40-60% LICA stenosis.  . Horner's syndrome     left  . SVT (supraventricular tachycardia)     transient  . Hypothyroidism   . Dementia     History   Social History  . Marital Status: Widowed    Spouse Name: N/A    Number of Children: N/A  . Years of Education: N/A   Occupational History  . Not on file.   Social History Main Topics  . Smoking status: Former Smoker    Types: Cigarettes    Quit date: 05/25/1950  . Smokeless tobacco: Never Used  . Alcohol Use: No  . Drug Use: No  . Sexual Activity: Yes   Other Topics Concern  . Not on file   Social History Narrative  . No  narrative on file    No past surgical history on file.  Family History  Problem Relation Age of Onset  . Hypertension Mother   . COPD Father     No Known Allergies  Current Outpatient Prescriptions on File Prior to Visit  Medication Sig Dispense Refill  . amLODipine (NORVASC) 10 MG tablet TAKE 1 TABLET BY MOUTH DAILY  30 tablet  0  . aspirin 81 MG tablet Take 162 mg by mouth daily.       [name]. atorvastatin (LIPITOR) 20 MG tablet TAKE 1 TABLET BY MOUTH EVERY DAY  30 tablet  0  . carvedilol (COREG) 12.5 MG tablet TAKE 1 TABLET BY MOUTH TWICE A DAY  60 tablet  0  . cloNIDine (CATAPRES) 0.1 MG tablet Take 0.1 mg by mouth 3 (three) times daily.      [name]. donepezil (ARICEPT) 5 MG tablet Take 1 tablet (5 mg total) by mouth at bedtime.  30 tablet  5  . lactulose (CHRONULAC) 10 GM/15ML solution       . levETIRAcetam (KEPPRA) 250 MG tablet TAKE 1 TABLET TWICE A DAY  60 tablet  12  . levothyroxine (SYNTHROID, LEVOTHROID) 25 MCG tablet TAKE 1 TABLET EVERY DAY  30 tablet  5  . nitroGLYCERIN (NITROSTAT) 0.4 MG SL tablet Place 0.4 mg under the tongue every 5 (five) minutes  as needed for chest pain.       [name]. sertraline (ZOLOFT) 25 MG tablet TAKE 1 TABLET BY MOUTH EVERY DAY  30 tablet  5  . valsartan (DIOVAN) 160 MG tablet TAKE 1 TABLET TWICE A DAY  60 tablet  0  . Vitamin D, Ergocalciferol, (DRISDOL) 50000 UNITS CAPS capsule Take 50,000 Units by mouth every 7 (seven) days. On saturdays       No current facility-administered medications on file prior to visit.    BP 144/70  Pulse 57  Ht 4\' 1"  (1.245 m)  Wt 134 lb (60.782 kg)  BMI 39.21 kg/m2chart    Objective:   Physical Exam  Constitutional: She is oriented to person, place, and time. She appears well-developed and well-nourished.  HENT:  Right Ear: External ear normal.  Left Ear: External ear normal.  Mouth/Throat: Oropharynx is clear and moist.  Neck: Normal range of motion. Neck supple.  Cardiovascular: Normal rate, regular rhythm and normal  heart sounds.   Pulmonary/Chest: Effort normal and breath sounds normal.  Abdominal: Soft. Bowel sounds are normal.  Musculoskeletal: Normal range of motion.  Neurological: She is alert and oriented to person, place, and time.  Skin: Skin is warm and dry.  Psychiatric: She has a normal mood and affect.          Assessment & Plan:   Problem List Items Addressed This Visit   HYPERTENSION   Relevant Orders      Basic Metabolic Panel   UNSPECIFIED HYPOTHYROIDISM - Primary   Relevant Orders      TSH    Other Visit Diagnoses   Pure hypercholesterolemia        Relevant Orders       Hepatic Function Panel       Lipid Panel    Depressive disorder, not elsewhere classified        Relevant Orders       TSH     Recheck in 6 months and sooner as needed.

## 2013-08-26 ENCOUNTER — Other Ambulatory Visit: Payer: Self-pay | Admitting: Internal Medicine

## 2013-09-11 ENCOUNTER — Other Ambulatory Visit: Payer: Self-pay | Admitting: Internal Medicine

## 2013-09-14 ENCOUNTER — Other Ambulatory Visit: Payer: Self-pay | Admitting: Cardiology

## 2013-09-19 ENCOUNTER — Other Ambulatory Visit: Payer: Self-pay | Admitting: Cardiology

## 2013-09-22 ENCOUNTER — Encounter: Payer: Self-pay | Admitting: Physician Assistant

## 2013-09-22 ENCOUNTER — Ambulatory Visit (INDEPENDENT_AMBULATORY_CARE_PROVIDER_SITE_OTHER): Payer: Medicare Other | Admitting: Physician Assistant

## 2013-09-22 VITALS — BP 142/64 | HR 60 | Ht <= 58 in | Wt 131.0 lb

## 2013-09-22 DIAGNOSIS — I5032 Chronic diastolic (congestive) heart failure: Secondary | ICD-10-CM

## 2013-09-22 DIAGNOSIS — I6529 Occlusion and stenosis of unspecified carotid artery: Secondary | ICD-10-CM | POA: Diagnosis not present

## 2013-09-22 DIAGNOSIS — I10 Essential (primary) hypertension: Secondary | ICD-10-CM | POA: Diagnosis not present

## 2013-09-22 DIAGNOSIS — I6523 Occlusion and stenosis of bilateral carotid arteries: Secondary | ICD-10-CM

## 2013-09-22 DIAGNOSIS — I2510 Atherosclerotic heart disease of native coronary artery without angina pectoris: Secondary | ICD-10-CM | POA: Diagnosis not present

## 2013-09-22 DIAGNOSIS — E785 Hyperlipidemia, unspecified: Secondary | ICD-10-CM

## 2013-09-22 DIAGNOSIS — I658 Occlusion and stenosis of other precerebral arteries: Secondary | ICD-10-CM

## 2013-09-22 MED ORDER — CARVEDILOL 12.5 MG PO TABS: ORAL_TABLET | ORAL | Status: DC

## 2013-09-22 MED ORDER — ATORVASTATIN CALCIUM 20 MG PO TABS: ORAL_TABLET | ORAL | Status: DC

## 2013-09-22 NOTE — Progress Notes (Signed)
Cardiology Office Note    Date:  09/22/2013   ID:  [name], DOB 02-12-1921, MRN [id number]  PCP:  Janell Quiet, FNP  Cardiologist:  Dr. Marca Ancona      History of Present Illness: [name] is a 80 y.o. female with a history of CAD, status post non-STEMI in 04/2008 treated with a DES to the circumflex and DES to the LAD, diastolic CHF, TIAs versus atypical migraines, carotid stenosis, CKD.  Last seen by Dr. Shirlee Latch 07/2012.  She is here with her son.  She lives by herself and remains independent.  She denies significant dyspnea, chest pain, syncope, orthopnea, PND.  She denies significant LE edema.     Studies:  - LHC (3/10):  CFX 90%, mid LAD 80%  >>>  PCI: Endeavor DES to CFX and Endeavor DES to LAD  - Echo (4/11):  EF 55%, inferobasal HK, mild MR, mild LAE, PASP 44 mHg  - Carotid US (11/14):  Bilateral ICA 40-59% - repeat 1 year   Recent Labs: 08/21/2013: ALT 17; Creatinine 1.3*; HDL Cholesterol by NMR 65.00; LDL (calc) 23; Potassium 4.5; TSH 0.67   Wt Readings from Last 3 Encounters:  09/22/13 131 lb (59.421 kg)  08/21/13 134 lb (60.782 kg)  07/17/13 136 lb (61.689 kg)     Past Medical History  Diagnosis Date  . Hypertension   . Coronary artery disease     NSTEMI 3/10. LHC showed 80% mLAD, 90% mCFX. She had PCI with Endeavor 2.5 x 12 to LAD and Endeavor 3.0 x 12 to CFX  . CHF (congestive heart failure)     Diastolic CHF. Echo (3/10) EF 45-50% with periapical akinesis. Mild LVH. Mild MR. Pseudonormal diastolic function. Echo (4/10): EF 65% with mild focal basal septal hypertrophy. Pseudonormal diastolic function. Normal wall motion. Moderate biatrial enlargement. PASP 61 mmHg. Echo (4/11): EF 55%, inferobasal hypokinesis, mild MR, PA systolic pressure 44 mmHg.  . Frequent falls   . Lacunar stroke     on CT  . Expressive aphasia syndrome     More likely atypical migraine than TIA. Carotid dopplers (8/09) with 40-60% LICA stenosis.  . Horner's syndrome      left  . SVT (supraventricular tachycardia)     transient  . Hypothyroidism   . Dementia     Current Outpatient Prescriptions  Medication Sig Dispense Refill  . amLODipine (NORVASC) 10 MG tablet TAKE 1 TABLET BY MOUTH DAILY  30 tablet  1  . aspirin 81 MG tablet Take 162 mg by mouth daily.       [name] atorvastatin (LIPITOR) 20 MG tablet TAKE 1 TABLET BY MOUTH EVERY DAY  30 tablet  0  . carvedilol (COREG) 12.5 MG tablet TAKE 1 TABLET BY MOUTH TWICE A DAY  60 tablet  0  . cloNIDine (CATAPRES) 0.1 MG tablet TAKE 1 TABLET BY MOUTH 3 TIMES A DAY  270 tablet  0  . donepezil (ARICEPT) 5 MG tablet Take 1 tablet (5 mg total) by mouth at bedtime.  30 tablet  5  . lactulose (CHRONULAC) 10 GM/15ML solution       . levETIRAcetam (KEPPRA) 250 MG tablet TAKE 1 TABLET TWICE A DAY  60 tablet  12  . levothyroxine (SYNTHROID, LEVOTHROID) 25 MCG tablet TAKE 1 TABLET EVERY DAY  30 tablet  5  . nitroGLYCERIN (NITROSTAT) 0.4 MG SL tablet Place 0.4 mg under the tongue every 5 (five) minutes as needed for chest pain.       [name]  sertraline (ZOLOFT) 25 MG tablet TAKE 1 TABLET BY MOUTH EVERY DAY  30 tablet  5  . valsartan (DIOVAN) 160 MG tablet TAKE 1 TABLET BY MOUTH TWICE A DAY  60 tablet  1  . Vitamin D, Ergocalciferol, (DRISDOL) 50000 UNITS CAPS capsule Take 50,000 Units by mouth every 7 (seven) days. On saturdays       No current facility-administered medications for this visit.    Allergies:   Review of patient's allergies indicates no known allergies.   Social History:  The patient  reports that she quit smoking about 63 years ago. Her smoking use included Cigarettes. She smoked 0.00 packs per day. She has never used smokeless tobacco. She reports that she does not drink alcohol or use illicit drugs.   Family History:  The patient's family history includes COPD in her father; Hypertension in her mother.   ROS:  Please see the history of present illness.      All other systems reviewed and negative.   PHYSICAL  EXAM: VS:  BP 142/64  Pulse 60  Ht 4\' 1"  (1.245 m)  Wt 131 lb (59.421 kg)  BMI 38.34 kg/m2 Well nourished, well developed, in no acute distress HEENT: normal Neck: no JVD Cardiac:  normal S1, S2; RRR; no murmur Lungs:  clear to auscultation bilaterally, no wheezing, rhonchi or rales Abd: soft, nontender, no hepatomegaly Ext: no edema Skin: warm and dry Neuro:  CNs 2-12 intact, no focal abnormalities noted  EKG:  NSR, HR 60, normal axis, no ST changes     ASSESSMENT AND PLAN:  CAD:  No angina.  Continue ASA, statin, beta blocker.   DIASTOLIC HEART FAILURE, CHRONIC:  Volume stable.    Carotid stenosis, bilateral:  Repeat US due in 12/2013.  HYPERTENSION:  Controlled.   HYPERLIPIDEMIA-MIXED:  Recent LDL optimal.  Continue statin.    Disposition:  F/u with Dr. Marca Anconaalton McLean in 6 mos.    Signed, Brynda RimScott Weaver, PA-C, MHS 09/22/2013 2:56 PM    [facility] [address], [place], [place]  [address] Phone: [phone]; Fax: [phone]

## 2013-09-22 NOTE — Patient Instructions (Addendum)
Schedule follow up with Dr. Marca Anconaalton McLean in 6 months.   Your physician recommends that you continue on your current medications as directed. Please refer to the Current Medication list given to you today.

## 2013-09-28 ENCOUNTER — Telehealth: Payer: Self-pay | Admitting: Physician Assistant

## 2013-09-28 NOTE — Telephone Encounter (Signed)
New message     Can pt take atorvastatin and donepezil at the same time?  OK to leave msg on vm.

## 2013-09-28 NOTE — Telephone Encounter (Signed)
lmom ok to take lipitor and aricept together.

## 2013-10-17 ENCOUNTER — Other Ambulatory Visit: Payer: Self-pay | Admitting: Cardiology

## 2013-10-17 ENCOUNTER — Other Ambulatory Visit: Payer: Self-pay | Admitting: Internal Medicine

## 2013-10-19 ENCOUNTER — Other Ambulatory Visit: Payer: Self-pay

## 2013-10-19 DIAGNOSIS — Z1231 Encounter for screening mammogram for malignant neoplasm of breast: Secondary | ICD-10-CM

## 2013-11-12 ENCOUNTER — Telehealth: Payer: Self-pay | Admitting: Family

## 2013-11-12 NOTE — Telephone Encounter (Signed)
[id number] - [place], [place] - [address] is requesting 90 day re-fills on the following: levothyroxine (SYNTHROID, LEVOTHROID) 25 MCG tablet sertraline (ZOLOFT) 25 MG tablet cloNIDine (CATAPRES) 0.1 MG tablet donepezil (ARICEPT) 5 MG tablet

## 2013-11-13 ENCOUNTER — Other Ambulatory Visit: Payer: Self-pay

## 2013-11-13 MED ORDER — SERTRALINE HCL 25 MG PO TABS: ORAL_TABLET | ORAL | Status: DC

## 2013-11-13 MED ORDER — CLONIDINE HCL 0.1 MG PO TABS: ORAL_TABLET | ORAL | Status: DC

## 2013-11-13 MED ORDER — DONEPEZIL HCL 5 MG PO TABS: ORAL_TABLET | ORAL | Status: DC

## 2013-11-13 MED ORDER — AMLODIPINE BESYLATE 10 MG PO TABS: ORAL_TABLET | ORAL | Status: DC

## 2013-11-13 MED ORDER — LEVETIRACETAM 250 MG PO TABS: ORAL_TABLET | ORAL | Status: DC

## 2013-11-13 MED ORDER — VALSARTAN 160 MG PO TABS: ORAL_TABLET | ORAL | Status: DC

## 2013-11-13 MED ORDER — LEVOTHYROXINE SODIUM 25 MCG PO TABS: ORAL_TABLET | ORAL | Status: DC

## 2013-11-13 NOTE — Telephone Encounter (Signed)
Done

## 2013-11-15 ENCOUNTER — Other Ambulatory Visit: Payer: Self-pay | Admitting: Cardiology

## 2013-11-18 ENCOUNTER — Ambulatory Visit
Admission: RE | Admit: 2013-11-18 | Discharge: 2013-11-18 | Disposition: A | Discharge status: Home / Self Care | Payer: Medicare Other | Source: Ambulatory Visit

## 2013-11-18 ENCOUNTER — Encounter (INDEPENDENT_AMBULATORY_CARE_PROVIDER_SITE_OTHER): Payer: Self-pay

## 2013-11-18 DIAGNOSIS — Z1231 Encounter for screening mammogram for malignant neoplasm of breast: Secondary | ICD-10-CM | POA: Diagnosis not present

## 2013-11-24 ENCOUNTER — Other Ambulatory Visit: Payer: Self-pay | Admitting: Internal Medicine

## 2014-01-12 DIAGNOSIS — H2513 Age-related nuclear cataract, bilateral: Secondary | ICD-10-CM | POA: Diagnosis not present

## 2014-02-08 ENCOUNTER — Telehealth: Payer: Self-pay

## 2014-02-08 NOTE — Telephone Encounter (Signed)
Rx request for Levothyroxine 25 mcg tablet- Take 1 tablet every day #30  Pharm: [facility]  Pls advise.

## 2014-02-09 MED ORDER — LEVOTHYROXINE SODIUM 25 MCG PO TABS: ORAL_TABLET | ORAL | Status: DC

## 2014-02-09 NOTE — Telephone Encounter (Signed)
Done

## 2014-02-12 ENCOUNTER — Encounter: Payer: Self-pay | Admitting: Family

## 2014-02-12 ENCOUNTER — Ambulatory Visit (INDEPENDENT_AMBULATORY_CARE_PROVIDER_SITE_OTHER): Payer: Medicare Other | Admitting: Family

## 2014-02-12 VITALS — BP 140/60 | HR 64 | Temp 97.7°F | Ht 59.0 in | Wt 130.0 lb

## 2014-02-12 DIAGNOSIS — F0390 Unspecified dementia without behavioral disturbance: Secondary | ICD-10-CM

## 2014-02-12 DIAGNOSIS — E559 Vitamin D deficiency, unspecified: Secondary | ICD-10-CM | POA: Diagnosis not present

## 2014-02-12 DIAGNOSIS — E039 Hypothyroidism, unspecified: Secondary | ICD-10-CM

## 2014-02-12 DIAGNOSIS — E785 Hyperlipidemia, unspecified: Secondary | ICD-10-CM | POA: Diagnosis not present

## 2014-02-12 DIAGNOSIS — I6523 Occlusion and stenosis of bilateral carotid arteries: Secondary | ICD-10-CM | POA: Diagnosis not present

## 2014-02-12 DIAGNOSIS — I10 Essential (primary) hypertension: Secondary | ICD-10-CM | POA: Diagnosis not present

## 2014-02-12 LAB — BASIC METABOLIC PANEL
BUN: 29 mg/dL — ABNORMAL HIGH (ref 6–23)
CHLORIDE: 103 meq/L (ref 96–112)
CO2: 24 meq/L (ref 19–32)
CREATININE: 1.4 mg/dL — AB (ref 0.4–1.2)
Calcium: 9.3 mg/dL (ref 8.4–10.5)
GFR: 38.36 mL/min — ABNORMAL LOW (ref >60.00)
GLUCOSE: 82 mg/dL (ref 70–99)
Potassium: 4.4 mEq/L (ref 3.5–5.1)
Sodium: 137 mEq/L (ref 135–145)

## 2014-02-12 LAB — HEPATIC FUNCTION PANEL
ALK PHOS: 65 U/L (ref 39–117)
ALT: 16 U/L (ref 0–35)
AST: 22 U/L (ref 0–37)
Albumin: 3.6 g/dL (ref 3.5–5.2)
BILIRUBIN DIRECT: 0 mg/dL (ref 0.0–0.3)
TOTAL PROTEIN: 7.1 g/dL (ref 6.0–8.3)
Total Bilirubin: 0.6 mg/dL (ref 0.2–1.2)

## 2014-02-12 NOTE — Patient Instructions (Signed)

## 2014-02-12 NOTE — Progress Notes (Signed)
Pre visit review using our clinic review tool, if applicable. No additional management support is needed unless otherwise documented below in the visit note. 

## 2014-02-12 NOTE — Progress Notes (Signed)
Subjective:    Patient ID: [name], female    DOB: 10/16/1920, 80 y.o.   MRN: [id number]  HPI [age] year white female, nonsmoker with a history of depression, hypothyroidism, hyperlipidemia, hypertension, and dementia is in today for recheck. She is doing well. Tolerates medications well. No concerns.   Review of Systems  Constitutional: Negative.   HENT: Negative.   Respiratory: Negative.   Cardiovascular: Negative.   Gastrointestinal: Negative.   Endocrine: Negative.   Genitourinary: Negative.   Musculoskeletal: Negative.   Skin: Negative.   Allergic/Immunologic: Negative.   Neurological: Negative.   Hematological: Negative.   Psychiatric/Behavioral: Negative.    Past Medical History  Diagnosis Date  . Hypertension   . Coronary artery disease     NSTEMI 3/10. LHC showed 80% mLAD, 90% mCFX. She had PCI with Endeavor 2.5 x 12 to LAD and Endeavor 3.0 x 12 to CFX  . CHF (congestive heart failure)     Diastolic CHF. Echo (3/10) EF 45-50% with periapical akinesis. Mild LVH. Mild MR. Pseudonormal diastolic function. Echo (4/10): EF 65% with mild focal basal septal hypertrophy. Pseudonormal diastolic function. Normal wall motion. Moderate biatrial enlargement. PASP 61 mmHg. Echo (4/11): EF 55%, inferobasal hypokinesis, mild MR, PA systolic pressure 44 mmHg.  . Frequent falls   . Lacunar stroke     on CT  . Expressive aphasia syndrome     More likely atypical migraine than TIA. Carotid dopplers (8/09) with 40-60% LICA stenosis.  . Horner's syndrome     left  . SVT (supraventricular tachycardia)     transient  . Hypothyroidism   . Dementia     History   Social History  . Marital Status: Widowed    Spouse Name: N/A    Number of Children: N/A  . Years of Education: N/A   Occupational History  . Not on file.   Social History Main Topics  . Smoking status: Former Smoker    Types: Cigarettes    Quit date: 05/25/1950  . Smokeless tobacco: Never Used  . Alcohol Use: No    . Drug Use: No  . Sexual Activity: Yes   Other Topics Concern  . Not on file   Social History Narrative    History reviewed. No pertinent past surgical history.  Family History  Problem Relation Age of Onset  . Hypertension Mother   . COPD Father     No Known Allergies  Current Outpatient Prescriptions on File Prior to Visit  Medication Sig Dispense Refill  . amLODipine (NORVASC) 10 MG tablet TAKE 1 TABLET BY MOUTH DAILY 90 tablet 3  . aspirin 81 MG tablet Take 162 mg by mouth daily.     [name]. atorvastatin (LIPITOR) 20 MG tablet TAKE 1 TABLET BY MOUTH EVERY DAY 30 tablet 11  . carvedilol (COREG) 12.5 MG tablet TAKE 1 TABLET BY MOUTH TWICE A DAY 60 tablet 11  . cloNIDine (CATAPRES) 0.1 MG tablet TAKE 1 TABLET BY MOUTH 3 TIMES A DAY 270 tablet 1  . donepezil (ARICEPT) 5 MG tablet TAKE 1 TABLET (5 MG TOTAL) BY MOUTH AT BEDTIME. 30 tablet 5  . lactulose (CHRONULAC) 10 GM/15ML solution     . levETIRAcetam (KEPPRA) 250 MG tablet TAKE 1 TABLET TWICE A DAY 180 tablet 2  . levothyroxine (SYNTHROID, LEVOTHROID) 25 MCG tablet TAKE 1 TABLET EVERY DAY 30 tablet 0  . nitroGLYCERIN (NITROSTAT) 0.4 MG SL tablet Place 0.4 mg under the tongue every 5 (five) minutes as needed for  chest pain.     [name]. sertraline (ZOLOFT) 25 MG tablet TAKE 1 TABLET BY MOUTH EVERY DAY 30 tablet 5  . valsartan (DIOVAN) 160 MG tablet TAKE 1 TABLET BY MOUTH TWICE A DAY 180 tablet 3  . Vitamin D, Ergocalciferol, (DRISDOL) 50000 UNITS CAPS capsule Take 50,000 Units by mouth every 7 (seven) days. On saturdays    . Vitamin D, Ergocalciferol, (DRISDOL) 50000 UNITS CAPS capsule TAKE ONE CAPSULE BY MOUTH WEEKLY 30 capsule 0   No current facility-administered medications on file prior to visit.    BP 140/60 mmHg  Pulse 64  Temp(Src) 97.7 F (36.5 C) (Oral)  Ht 4\' 11"  (1.499 m)  Wt 130 lb (58.968 kg)  BMI 26.24 kg/m2  SpO2 94%chart    Objective:   Physical Exam  Constitutional: She is oriented to person, place, and time.  She appears well-developed and well-nourished.  HENT:  Right Ear: External ear normal.  Left Ear: External ear normal.  Nose: Nose normal.  Mouth/Throat: Oropharynx is clear and moist.  Neck: Normal range of motion. Neck supple. No thyromegaly present.  Cardiovascular: Normal rate, regular rhythm and normal heart sounds.   Pulmonary/Chest: Effort normal and breath sounds normal.  Abdominal: Soft. Bowel sounds are normal.  Musculoskeletal: Normal range of motion.  Neurological: She is alert and oriented to person, place, and time.  Skin: Skin is warm and dry.  Psychiatric: She has a normal mood and affect.          Assessment & Plan:  [name] was seen today for follow-up.  Diagnoses and associated orders for this visit:  Essential hypertension - Basic Metabolic Panel - Hepatic Function Panel  Hyperlipemia - Basic Metabolic Panel - Hepatic Function Panel  Hypothyroidism, unspecified hypothyroidism type - TSH  Dementia, without behavioral disturbance  Vitamin D deficiency    Continue current medications. Encouraged healthy diet and exercise. Follow-up in 6, pending labs, sooner as needed.

## 2014-02-15 ENCOUNTER — Telehealth: Payer: Self-pay | Admitting: Family

## 2014-02-15 ENCOUNTER — Other Ambulatory Visit: Payer: Medicare Other

## 2014-02-15 ENCOUNTER — Telehealth: Payer: Self-pay

## 2014-02-15 DIAGNOSIS — E079 Disorder of thyroid, unspecified: Secondary | ICD-10-CM

## 2014-02-15 MED ORDER — LEVOTHYROXINE SODIUM 25 MCG PO TABS: ORAL_TABLET | ORAL | Status: DC

## 2014-02-15 NOTE — Telephone Encounter (Signed)
Rx was sent 10/2013 with 5 refills. Spoke with pharmacist to advise. This request has been canceled

## 2014-02-15 NOTE — Telephone Encounter (Signed)
emmi mailed  °

## 2014-02-15 NOTE — Addendum Note (Signed)
Addended by: Beverely LowFRAZIER, Kassia Demarinis L on: 02/15/2014 03:43 PM   Modules accepted: Orders

## 2014-02-15 NOTE — Telephone Encounter (Signed)
Rx request for Levothyroxine 25 mcg tablet- Take 1 tablet daily #30

## 2014-02-15 NOTE — Telephone Encounter (Signed)
Rx request for Donepezil HCL 5 mg tablet- Take 1 tablet by mouth at bedtime.   Pharm:  [facility] [address]   Pls advise.

## 2014-02-16 LAB — TSH: TSH: 3.586 u[IU]/mL (ref 0.350–4.500)

## 2014-03-16 ENCOUNTER — Other Ambulatory Visit: Payer: Self-pay | Admitting: Family

## 2014-03-17 ENCOUNTER — Other Ambulatory Visit: Payer: Self-pay | Admitting: Family

## 2014-05-14 ENCOUNTER — Other Ambulatory Visit: Payer: Self-pay | Admitting: Family Medicine

## 2014-05-14 MED ORDER — LACTULOSE 10 GM/15ML PO SOLN: ORAL | Status: DC

## 2014-05-22 ENCOUNTER — Other Ambulatory Visit: Payer: Self-pay | Admitting: Family

## 2014-06-07 ENCOUNTER — Other Ambulatory Visit: Payer: Self-pay | Admitting: Family

## 2014-06-10 ENCOUNTER — Other Ambulatory Visit: Payer: Self-pay | Admitting: Family

## 2014-06-14 ENCOUNTER — Other Ambulatory Visit: Payer: Self-pay | Admitting: Family

## 2014-06-15 ENCOUNTER — Other Ambulatory Visit: Payer: Self-pay | Admitting: Neurology

## 2014-07-11 ENCOUNTER — Other Ambulatory Visit: Payer: Self-pay | Admitting: Neurology

## 2014-07-12 NOTE — Telephone Encounter (Signed)
Has appt scheduled.

## 2014-07-13 ENCOUNTER — Other Ambulatory Visit: Payer: Self-pay | Admitting: Family

## 2014-07-19 ENCOUNTER — Ambulatory Visit: Payer: Medicare Other | Admitting: Neurology

## 2014-07-21 ENCOUNTER — Other Ambulatory Visit: Payer: Self-pay | Admitting: Family

## 2014-07-27 ENCOUNTER — Ambulatory Visit (INDEPENDENT_AMBULATORY_CARE_PROVIDER_SITE_OTHER): Payer: Medicare Other | Admitting: Physician Assistant

## 2014-07-27 ENCOUNTER — Encounter: Payer: Self-pay | Admitting: Physician Assistant

## 2014-07-27 VITALS — BP 158/62 | HR 63 | Ht 59.0 in | Wt 126.8 lb

## 2014-07-27 DIAGNOSIS — I5032 Chronic diastolic (congestive) heart failure: Secondary | ICD-10-CM | POA: Diagnosis not present

## 2014-07-27 DIAGNOSIS — I2510 Atherosclerotic heart disease of native coronary artery without angina pectoris: Secondary | ICD-10-CM

## 2014-07-27 DIAGNOSIS — I10 Essential (primary) hypertension: Secondary | ICD-10-CM

## 2014-07-27 DIAGNOSIS — I6523 Occlusion and stenosis of bilateral carotid arteries: Secondary | ICD-10-CM | POA: Diagnosis not present

## 2014-07-27 NOTE — Progress Notes (Signed)
Cardiology Office Note   Date:  07/27/2014   ID:  [name], DOB May 18, 1920, MRN [id number]  PCP:  Dr. Orvan Falconer  Cardiologist:  Dr. Shirlee Latch Chief Complaint: f/u CAD    History of Present Illness: [name] is a 81 y.o. female who presents for yearly follow-up. She has a history of CAD status post non-STEMI in 2010 treated with DES to the circumflex and LAD, diastolic heart failure, TIAs versus atypical migraines, carotid stenosis.  Patient continues to live independently but is surrounded by family. She is accompanied today by her son. She had a recent fall when she stepped in a hole while talking to her neighbor. She skinned her arm and EMS came but she was stable. She had no dizziness or syncope. She denies chest pain, palpitations, dyspnea, dyspnea on exertion, dizziness or presyncope. Her main complaint is worsening hearing and her memory isn't as good as it used to be. She turns 89 tomorrow.   Past Medical History  Diagnosis Date  . Hypertension   . Coronary artery disease     NSTEMI 3/10. LHC showed 80% mLAD, 90% mCFX. She had PCI with Endeavor 2.5 x 12 to LAD and Endeavor 3.0 x 12 to CFX  . CHF (congestive heart failure)     Diastolic CHF. Echo (3/10) EF 45-50% with periapical akinesis. Mild LVH. Mild MR. Pseudonormal diastolic function. Echo (4/10): EF 65% with mild focal basal septal hypertrophy. Pseudonormal diastolic function. Normal wall motion. Moderate biatrial enlargement. PASP 61 mmHg. Echo (4/11): EF 55%, inferobasal hypokinesis, mild MR, PA systolic pressure 44 mmHg.  . Frequent falls   . Lacunar stroke     on CT  . Expressive aphasia syndrome     More likely atypical migraine than TIA. Carotid dopplers (8/09) with 40-60% LICA stenosis.  . Horner's syndrome     left  . SVT (supraventricular tachycardia)     transient  . Hypothyroidism   . Dementia     No past surgical history on file.   Current Outpatient Prescriptions  Medication Sig Dispense Refill  .  amLODipine (NORVASC) 10 MG tablet TAKE 1 TABLET BY MOUTH DAILY 90 tablet 3  . aspirin 81 MG tablet Take 162 mg by mouth daily.     [name] atorvastatin (LIPITOR) 20 MG tablet TAKE 1 TABLET BY MOUTH EVERY DAY 30 tablet 11  . carvedilol (COREG) 12.5 MG tablet TAKE 1 TABLET BY MOUTH TWICE A DAY 60 tablet 11  . cloNIDine (CATAPRES) 0.1 MG tablet TAKE 1 TABLET BY MOUTH 3 TIMES A DAY 270 tablet 1  . donepezil (ARICEPT) 5 MG tablet TAKE 1 TABLET (5 MG TOTAL) BY MOUTH AT BEDTIME. 30 tablet 5  . lactulose (CHRONULAC) 10 GM/15ML solution TAKE 15 MLS BY MOUTH DAILY FOR BOWELS. INCREASE TO 30 IF CONSTIPATED 240 mL 1  . levETIRAcetam (KEPPRA) 250 MG tablet TAKE 1 TABLET TWICE A DAY 180 tablet 0  . levothyroxine (SYNTHROID, LEVOTHROID) 25 MCG tablet TAKE 1 TABLET EVERY DAY 90 tablet 1  . nitroGLYCERIN (NITROSTAT) 0.4 MG SL tablet Place 0.4 mg under the tongue every 5 (five) minutes as needed for chest pain.     [name] sertraline (ZOLOFT) 25 MG tablet TAKE 1 TABLET BY MOUTH EVERY DAY 30 tablet 3  . valsartan (DIOVAN) 160 MG tablet TAKE 1 TABLET BY MOUTH TWICE A DAY 180 tablet 3  . Vitamin D, Ergocalciferol, (DRISDOL) 50000 UNITS CAPS capsule TAKE ONE CAPSULE BY MOUTH WEEKLY 30 capsule 0   No current  facility-administered medications for this visit.    Allergies:   Review of patient's allergies indicates no known allergies.    Social History:  The patient  reports that she quit smoking about 64 years ago. Her smoking use included Cigarettes. She has never used smokeless tobacco. She reports that she does not drink alcohol or use illicit drugs.   Family History:  The patient's   family history includes COPD in her father; Hypertension in her mother.    ROS:  Please see the history of present illness.   Otherwise, review of systems are positive for none.   All other systems are reviewed and negative.    PHYSICAL EXAM: VS:  BP 158/62 mmHg  Pulse 63  Ht 4\' 11"  (1.499 m)  Wt 126 lb 12.8 oz (57.516 kg)  BMI 25.60  kg/m2  SpO2 98% , BMI Body mass index is 25.6 kg/(m^2). GEN: Well nourished, well developed, in no acute distress Neck: no JVD, HJR, carotid bruits, or masses Cardiac: RRR; as of S4, 2/6 systolic murmur at the left sternal border, no rubs, thrill or heave,  Respiratory:  clear to auscultation bilaterally, normal work of breathing GI: soft, nontender, nondistended, + BS MS: no deformity or atrophy Extremities: without cyanosis, clubbing, edema, good distal pulses bilaterally.  Skin: warm and dry, no rash Neuro:  Strength and sensation are intact    EKG:  EKG is not ordered today.  Recent Labs: 02/12/2014: ALT 16; BUN 29*; Creatinine 1.4*; Potassium 4.4; Sodium 137 02/15/2014: TSH 3.586    Lipid Panel    Component Value Date/Time   CHOL 113 08/21/2013 1014   TRIG 125.0 08/21/2013 1014   TRIG 189* 03/05/2006 0926   HDL 65.00 08/21/2013 1014   CHOLHDL 2 08/21/2013 1014   CHOLHDL 3.3 CALC 03/05/2006 0926   VLDL 25.0 08/21/2013 1014   LDLCALC 23 08/21/2013 1014   LDLDIRECT 36.0 03/22/2009 1604      Wt Readings from Last 3 Encounters:  07/27/14 126 lb 12.8 oz (57.516 kg)  02/12/14 130 lb (58.968 kg)  09/22/13 131 lb (59.421 kg)      Other studies Reviewed: Additional studies/ records that were reviewed today include and review of the records demonstrates  Last carotid Dopplers in 2014 40-59% bilateral ICA Studies:  - LHC (3/10):  CFX 90%, mid LAD 80%  >>>  PCI: Endeavor DES to CFX and Endeavor DES to LAD  - Echo (4/11):  EF 55%, inferobasal HK, mild MR, mild LAE, PASP 44 mHg  - Carotid US (11/14):  Bilateral ICA 40-59% - repeat 1 year    ASSESSMENT AND PLAN: CORONARY ATHEROSCLEROSIS NATIVE CORONARY ARTERY Stable without chest pain   Essential hypertension Blood pressure is up a little today but she walked further than she usually does to get to the office. Her blood pressure generally runs around 120/60 at home.   DIASTOLIC HEART FAILURE, CHRONIC No evidence  of heart failure.   Carotid stenosis Patient has history of carotid stenosis. It's been since 2014 that we've checked it. Order carotid Dopplers.      Elson ClanSigned, Michele Lenze, PA-C  07/27/2014 2:44 PM    [facility] [address], [place], [place]  [address] Phone: [phone]; Fax: [phone]

## 2014-07-27 NOTE — Assessment & Plan Note (Signed)
Patient has history of carotid stenosis. It's been since 2014 that we've checked it. Order carotid Dopplers.

## 2014-07-27 NOTE — Assessment & Plan Note (Signed)
Blood pressure is up a little today but she walked further than she usually does to get to the office. Her blood pressure generally runs around 120/60 at home.

## 2014-07-27 NOTE — Assessment & Plan Note (Signed)
Stable without chest pain 

## 2014-07-27 NOTE — Assessment & Plan Note (Signed)
No evidence of heart failure. 

## 2014-07-27 NOTE — Patient Instructions (Addendum)
Medication Instructions:   Your physician recommends that you continue on your current medications as directed. Please refer to the Current Medication list given to you today.   Labwork:   Testing/Procedures:  Your physician has requested that you have a carotid duplex IN July . This test is an ultrasound of the carotid arteries in your neck. It looks at blood flow through these arteries that supply the brain with blood. Allow one hour for this exam. There are no restrictions or special instructions.    Follow-Up:  Your physician wants you to follow-up in:  IN 6 MONTHS [facility]  You will receive a reminder letter in the mail two months in advance. If you don't receive a letter, please call our office to schedule the follow-up appointment.  Any Other Special Instructions Will Be Listed Below (If Applicable).

## 2014-08-02 ENCOUNTER — Ambulatory Visit: Payer: Medicare Other | Admitting: Family

## 2014-08-06 ENCOUNTER — Ambulatory Visit: Payer: Medicare Other | Admitting: Neurology

## 2014-08-10 ENCOUNTER — Encounter: Payer: Self-pay | Admitting: Neurology

## 2014-08-10 ENCOUNTER — Ambulatory Visit (INDEPENDENT_AMBULATORY_CARE_PROVIDER_SITE_OTHER): Payer: Medicare Other | Admitting: Neurology

## 2014-08-10 VITALS — BP 165/71 | HR 63 | Ht 59.0 in | Wt 124.5 lb

## 2014-08-10 DIAGNOSIS — I6523 Occlusion and stenosis of bilateral carotid arteries: Secondary | ICD-10-CM | POA: Diagnosis not present

## 2014-08-10 DIAGNOSIS — G40109 Localization-related (focal) (partial) symptomatic epilepsy and epileptic syndromes with simple partial seizures, not intractable, without status epilepticus: Secondary | ICD-10-CM | POA: Diagnosis not present

## 2014-08-10 DIAGNOSIS — R413 Other amnesia: Secondary | ICD-10-CM

## 2014-08-10 MED ORDER — DONEPEZIL HCL 10 MG PO TABS: ORAL_TABLET | ORAL | Status: DC

## 2014-08-10 NOTE — Progress Notes (Signed)
Chief Complaint  Patient presents with  . Seizures    Feels her seizures are well controlled on current medications.  . Memory Loss    MMSE 26/30 - 7 animals.  She is here with her son, [name], who feels her memory has worsened.  She has been on donepezil $RemoveBefo'5mg'YmghOLwQpBa$  but she has been having very active, vivid dreams since starting this medication.  She still lives alone but has family checking on her frequently.      PATIENT: [name] DOB: [birth year]  HISTORICAL  [name]  is a 81  -year-old right-handed Caucasian female follow-up for seizure, and cognitive impairment   On June 25, 2008, she presented to the ER with complex partial seizure. At that time she reportedly had a few years history of intermittent episode of difficulty talking that might last about 30 minutes, but never experienced total loss of consciousness. However on April 30, she developed episode of difficulty talking again, followed by head deviation to the right side, whole-body tonic-clonic movements.  The events lasted a few minutes and then she had post-ictal confusion.  She had 3 similar recurrent episodes in 2 hours, followed by transient mild right-sided paralysis.  A MRI of the brain  revealed mild chronic small vessel disease but no acute lesions. She most likely has complex partial seizure stemming from the left frontal region.  She was started on Keppra 250 b.i.d and has no recurrent episodes. Because of her mild right side paralysis she was admitted to a nursing facility for a few months but recovered well and is now back at home. She lives alone with family support.   She is dong well, lives alone, independent in her daily activity, her son check on her regularly.  She also had a gradual onset memory trouble  UPDATE Feb 5th 2014. She is doing very well, no recurrent seizures, lives alone at her house, son checks on her regularly, she is taking keppra $RemoveBefor'250mg'tsqjSWTlIuXo$  bid. she enjoys reading, relies on a cane.  UPDATE May  22nd 2015: She has no recurrent seizure, gradual onset mild memory trouble, getting much worse during the most recent UTI, is on donepezil 5 mg every night, complains of nightmare  UPDATE June 14th 2016: She is with her son at today's clinical visit, she had slow worsening memory trouble, was started on Aricept 5 mg daily by her primary care few months ago, which did help her, she complains of vivid dreams, no recurrent seizures.   REVIEW OF SYSTEMS: Full 14 system review of systems performed and notable only for as above  ALLERGIES: No Known Allergies  HOME MEDICATIONS: Current Outpatient Prescriptions on File Prior to Visit  Medication Sig Dispense Refill  . amLODipine (NORVASC) 10 MG tablet TAKE 1 TABLET BY MOUTH DAILY 90 tablet 3  . aspirin 81 MG tablet Take 162 mg by mouth daily.     [name] atorvastatin (LIPITOR) 20 MG tablet TAKE 1 TABLET BY MOUTH EVERY DAY 30 tablet 11  . carvedilol (COREG) 12.5 MG tablet TAKE 1 TABLET BY MOUTH TWICE A DAY 60 tablet 11  . cloNIDine (CATAPRES) 0.1 MG tablet TAKE 1 TABLET BY MOUTH 3 TIMES A DAY 270 tablet 1  . donepezil (ARICEPT) 5 MG tablet TAKE 1 TABLET (5 MG TOTAL) BY MOUTH AT BEDTIME. 30 tablet 5  . lactulose (CHRONULAC) 10 GM/15ML solution TAKE 15 MLS BY MOUTH DAILY FOR BOWELS. INCREASE TO 30 IF CONSTIPATED 240 mL 1  . levETIRAcetam (KEPPRA) 250 MG tablet  TAKE 1 TABLET TWICE A DAY 180 tablet 0  . levothyroxine (SYNTHROID, LEVOTHROID) 25 MCG tablet TAKE 1 TABLET EVERY DAY 90 tablet 1  . nitroGLYCERIN (NITROSTAT) 0.4 MG SL tablet Place 0.4 mg under the tongue every 5 (five) minutes as needed for chest pain.     [name] sertraline (ZOLOFT) 25 MG tablet TAKE 1 TABLET BY MOUTH EVERY DAY 30 tablet 3  . valsartan (DIOVAN) 160 MG tablet TAKE 1 TABLET BY MOUTH TWICE A DAY 180 tablet 3  . Vitamin D, Ergocalciferol, (DRISDOL) 50000 UNITS CAPS capsule TAKE ONE CAPSULE BY MOUTH WEEKLY 30 capsule 0   No current facility-administered medications on file prior to visit.      PAST MEDICAL HISTORY: Past Medical History  Diagnosis Date  . Hypertension   . Coronary artery disease     NSTEMI 3/10. LHC showed 80% mLAD, 90% mCFX. She had PCI with Endeavor 2.5 x 12 to LAD and Endeavor 3.0 x 12 to CFX  . CHF (congestive heart failure)     Diastolic CHF. Echo (3/10) EF 45-50% with periapical akinesis. Mild LVH. Mild MR. Pseudonormal diastolic function. Echo (4/10): EF 65% with mild focal basal septal hypertrophy. Pseudonormal diastolic function. Normal wall motion. Moderate biatrial enlargement. PASP 61 mmHg. Echo (4/11): EF 55%, inferobasal hypokinesis, mild MR, PA systolic pressure 44 mmHg.  . Frequent falls   . Lacunar stroke     on CT  . Expressive aphasia syndrome     More likely atypical migraine than TIA. Carotid dopplers (6/44) with 03-47% LICA stenosis.  . Horner's syndrome     left  . SVT (supraventricular tachycardia)     transient  . Hypothyroidism   . Dementia     PAST SURGICAL HISTORY: No past surgical history on file.  FAMILY HISTORY: Family History  Problem Relation Age of Onset  . Hypertension Mother   . COPD Father     SOCIAL HISTORY:  History   Social History  . Marital Status: Widowed    Spouse Name: N/A  . Number of Children: N/A  . Years of Education: N/A   Occupational History  . Not on file.   Social History Main Topics  . Smoking status: Former Smoker    Types: Cigarettes    Quit date: 05/25/1950  . Smokeless tobacco: Never Used  . Alcohol Use: No  . Drug Use: No  . Sexual Activity: Yes   Other Topics Concern  . Not on file   Social History Narrative     PHYSICAL EXAM   Filed Vitals:   08/10/14 1349  BP: 165/71  Pulse: 63  Height: $Remove'4\' 11"'qyVUUcJ$  (1.499 m)  Weight: 124 lb 8 oz (56.473 kg)    Not recorded      Body mass index is 25.13 kg/(m^2).  PHYSICAL EXAMNIATION:  Gen: NAD, conversant, well nourised, obese, well groomed                     Cardiovascular: Regular rate rhythm, no peripheral  edema, warm, nontender. Eyes: Conjunctivae clear without exudates or hemorrhage Neck: Supple, no carotid bruise. Pulmonary: Clear to auscultation bilaterally   NEUROLOGICAL EXAM:  MENTAL STATUS: Speech:    Speech is normal; fluent and spontaneous with normal comprehension.  Cognition:   MMSE 26/30, she is not oriented to date, physician, difficulty to do series 7, missed 2 out of 3 recalls, animal naming was 6  CRANIAL NERVES: CN II: Visual fields are full to confrontation.  Pupils are 4  mm and briskly reactive to light. Visual acuity is 20/20 bilaterally. CN III, IV, VI: extraocular movement are normal. No ptosis. CN V: Facial sensation is intact to pinprick in all 3 divisions bilaterally. Corneal responses are intact.  CN VII: Face is symmetric with normal eye closure and smile. CN VIII: Hearing is normal to rubbing fingers CN IX, X: Palate elevates symmetrically. Phonation is normal. CN XI: Head turning and shoulder shrug are intact CN XII: Tongue is midline with normal movements and no atrophy.  MOTOR: There is no pronator drift of out-stretched arms. Muscle bulk and tone are normal.  She has moderate bilateral ankle dorsiflexion weakness,  REFLEXES: Reflexes are 2+ and symmetric at the biceps, triceps, knees, and absent at  ankles. Plantar responses are flexor.  SENSORY:  Length dependent decreased light touch and pinprick   COORDINATION: Rapid alternating movements and fine finger movements are intact. There is no dysmetria on finger-to-nose and heel-knee-shin. There are no abnormal or extraneous movements.   GAIT/STANCE: She need to push up to get up from seated position, bilateral foot drop, wide based, mildly unsteady  DIAGNOSTIC DATA (LABS, IMAGING, TESTING) - I reviewed patient records, labs, notes, testing and imaging myself where available.  Lab Results  Component Value Date   WBC 8.8 07/25/2011   HGB 12.2 07/25/2011   HCT 36.7 07/25/2011   MCV 91.1  07/25/2011   PLT 233.0 07/25/2011      Component Value Date/Time   NA 137 02/12/2014 1027   K 4.4 02/12/2014 1027   CL 103 02/12/2014 1027   CO2 24 02/12/2014 1027   GLUCOSE 82 02/12/2014 1027   GLUCOSE 108* 03/05/2006 0926   BUN 29* 02/12/2014 1027   CREATININE 1.4* 02/12/2014 1027   CALCIUM 9.3 02/12/2014 1027   PROT 7.1 02/12/2014 1027   ALBUMIN 3.6 02/12/2014 1027   AST 22 02/12/2014 1027   ALT 16 02/12/2014 1027   ALKPHOS 65 02/12/2014 1027   BILITOT 0.6 02/12/2014 1027   GFRNONAA 41.52 11/09/2009 0000   GFRAA  06/25/2008 1330    >60        The eGFR has been calculated using the MDRD equation. This calculation has not been validated in all clinical situations. eGFR's persistently <60 mL/min signify possible Chronic Kidney Disease.   Lab Results  Component Value Date   CHOL 113 08/21/2013   HDL 65.00 08/21/2013   LDLCALC 23 08/21/2013   LDLDIRECT 36.0 03/22/2009   TRIG 125.0 08/21/2013   CHOLHDL 2 08/21/2013   Lab Results  Component Value Date   HGBA1C  06/25/2008    5.1 (NOTE) The ADA recommends the following therapeutic goal for glycemic control related to Hgb A1c measurement: Goal of therapy: <6.5 Hgb A1c  Reference: American Diabetes Association: Clinical Practice Recommendations 2010, Diabetes Care, 2010, 33: (Suppl  1).   Lab Results  Component Value Date   HTDSKAJG81 1572* 07/17/2013   Lab Results  Component Value Date   TSH 3.586 02/15/2014    ASSESSMENT AND PLAN  JADIN KAGEL is a 81 y.o. female   1, slow worsening memory trouble, continued to get worse despite donepezil 5 mg once a day, will increase to 10 mg daily  2. Complex partial seizure, doing very well on Keppra 250 mg twice a day  3. Gait difficulty, bilateral ankle dorsi flexion weakness, most consistent with lumbar radiculopathy, she denies low back pain, no bowel and bladder incontinence, we will not initiate an evaluation.  4, return to clinic in  6 months    Marcial Pacas, M.D.  Ph.D.  [facility] [address] [place], [place] [address] [phone]

## 2014-09-14 ENCOUNTER — Other Ambulatory Visit: Payer: Self-pay | Admitting: Physician Assistant

## 2014-09-16 ENCOUNTER — Other Ambulatory Visit: Payer: Self-pay | Admitting: Physician Assistant

## 2014-09-24 ENCOUNTER — Encounter (HOSPITAL_COMMUNITY): Payer: Medicare Other

## 2014-09-25 ENCOUNTER — Other Ambulatory Visit: Payer: Self-pay | Admitting: Family

## 2014-10-01 ENCOUNTER — Ambulatory Visit (HOSPITAL_COMMUNITY)
Admission: RE | Admit: 2014-10-01 | Discharge: 2014-10-01 | Disposition: A | Discharge status: Home / Self Care | Payer: Medicare Other | Source: Ambulatory Visit | Attending: Cardiovascular Disease | Admitting: Cardiovascular Disease

## 2014-10-01 DIAGNOSIS — I6523 Occlusion and stenosis of bilateral carotid arteries: Secondary | ICD-10-CM | POA: Diagnosis not present

## 2014-10-04 ENCOUNTER — Telehealth: Payer: Self-pay | Admitting: Cardiology

## 2014-10-04 NOTE — Telephone Encounter (Signed)
LM with on Son, Gerlene Burdock, [place]. DPR on file and son called okay to leave VM. Office number given if have questions

## 2014-10-04 NOTE — Telephone Encounter (Signed)
New message     Pt son calling in regards to results Pt son states to leave the results on his voicemail if you do not reach him Please call to discuss

## 2014-10-18 ENCOUNTER — Other Ambulatory Visit: Payer: Self-pay | Admitting: Internal Medicine

## 2014-10-18 ENCOUNTER — Telehealth: Payer: Self-pay | Admitting: Family

## 2014-10-18 ENCOUNTER — Ambulatory Visit: Payer: Medicare Other | Admitting: Family Medicine

## 2014-10-18 ENCOUNTER — Other Ambulatory Visit: Payer: Self-pay

## 2014-10-18 NOTE — Telephone Encounter (Signed)
Pt called to make an appt for a mammogram, but wants [place] to call her son so he can make the appt. [facility] Also pt needs to resch for 9/16 appt at 8:30.  Pt did not know about this appt.

## 2014-10-19 ENCOUNTER — Other Ambulatory Visit: Payer: Self-pay

## 2014-10-19 ENCOUNTER — Telehealth: Payer: Self-pay | Admitting: Family

## 2014-10-19 DIAGNOSIS — Z1231 Encounter for screening mammogram for malignant neoplasm of breast: Secondary | ICD-10-CM

## 2014-10-19 NOTE — Telephone Encounter (Signed)
[name], pt's son called to just give you a heads up on pt's visit 9/16. Pt's dementia has gotten worse, and he would like to discuss, out of the room, some concerns, and possibly change her dementia meds.

## 2014-10-19 NOTE — Telephone Encounter (Signed)
Lm on vm to cb and resc appt,  He also needs phone number to [facility]. Pt needs appt for Oct for her mammogram

## 2014-10-20 NOTE — Telephone Encounter (Signed)
Spoke w/ Mr Sanmiguel, son.  He states mom has dementia and should not have been calling.  He has already scheduled these appts.  And he request to keep the 8:30 appt w/ Kandee Keen.

## 2014-10-25 ENCOUNTER — Encounter (HOSPITAL_COMMUNITY): Payer: Medicare Other

## 2014-11-06 ENCOUNTER — Other Ambulatory Visit: Payer: Self-pay | Admitting: Cardiology

## 2014-11-12 ENCOUNTER — Ambulatory Visit (INDEPENDENT_AMBULATORY_CARE_PROVIDER_SITE_OTHER): Payer: Medicare Other | Admitting: Adult Health

## 2014-11-12 ENCOUNTER — Encounter: Payer: Self-pay | Admitting: Adult Health

## 2014-11-12 VITALS — BP 110/80 | HR 58 | Temp 97.9°F | Wt 119.7 lb

## 2014-11-12 DIAGNOSIS — I6523 Occlusion and stenosis of bilateral carotid arteries: Secondary | ICD-10-CM | POA: Diagnosis not present

## 2014-11-12 DIAGNOSIS — I10 Essential (primary) hypertension: Secondary | ICD-10-CM

## 2014-11-12 DIAGNOSIS — F329 Major depressive disorder, single episode, unspecified: Secondary | ICD-10-CM

## 2014-11-12 DIAGNOSIS — E039 Hypothyroidism, unspecified: Secondary | ICD-10-CM | POA: Diagnosis not present

## 2014-11-12 DIAGNOSIS — F32A Depression, unspecified: Secondary | ICD-10-CM

## 2014-11-12 LAB — BASIC METABOLIC PANEL
BUN: 36 mg/dL — ABNORMAL HIGH (ref 6–23)
CO2: 30 mEq/L (ref 19–32)
Calcium: 9.7 mg/dL (ref 8.4–10.5)
Chloride: 105 mEq/L (ref 96–112)
Creatinine, Ser: 1.5 mg/dL — ABNORMAL HIGH (ref 0.40–1.20)
GFR: 34.5 mL/min — AB (ref >60.00)
Glucose, Bld: 88 mg/dL (ref 70–99)
Potassium: 5.3 mEq/L — ABNORMAL HIGH (ref 3.5–5.1)
SODIUM: 141 meq/L (ref 135–145)

## 2014-11-12 LAB — HEPATIC FUNCTION PANEL
ALBUMIN: 3.7 g/dL (ref 3.5–5.2)
ALK PHOS: 57 U/L (ref 39–117)
ALT: 16 U/L (ref 0–35)
AST: 20 U/L (ref 0–37)
BILIRUBIN DIRECT: 0.1 mg/dL (ref 0.0–0.3)
Total Bilirubin: 0.5 mg/dL (ref 0.2–1.2)
Total Protein: 6.8 g/dL (ref 6.0–8.3)

## 2014-11-12 LAB — TSH: TSH: 3.07 u[IU]/mL (ref 0.35–4.50)

## 2014-11-12 NOTE — Patient Instructions (Signed)
It was great meeting you today!  I will follow up with you regarding your blood work.   Follow up with me in December for your physical and let me know if you need anything in the meantime.

## 2014-11-12 NOTE — Progress Notes (Signed)
Subjective:    Patient ID: [name], female    DOB: 10-30-1920, 81 y.o.   MRN: [id number]  HPI  38 year white female, nonsmoker with a history of depression, hypothyroidism, hyperlipidemia, hypertension, and dementia is in today for recheck. She is doing well. Tolerates medications well. No concerns. She continues to live at home.   Her son is with her during this visit. He endorses that she is doing well. Her dementia is controlled and she becomes confused at times but is able to " sit down and concentrate" and she remembers what she was thinking.   She is followed by Neurology for her dementia and is currently taking Aricept 10 mg.   She endorses that she has no headaches or dizziness.     Review of Systems  Constitutional: Negative.   HENT: Negative.   Eyes: Negative.   Respiratory: Negative.   Cardiovascular: Negative.   Gastrointestinal: Negative.   Endocrine: Negative.   Genitourinary: Negative.   Musculoskeletal: Negative.   Skin: Negative.   Allergic/Immunologic: Negative.   Neurological: Negative.   Psychiatric/Behavioral: Negative.   All other systems reviewed and are negative.  Past Medical History  Diagnosis Date  . Hypertension   . Coronary artery disease     NSTEMI 3/10. LHC showed 80% mLAD, 90% mCFX. She had PCI with Endeavor 2.5 x 12 to LAD and Endeavor 3.0 x 12 to CFX  . CHF (congestive heart failure)     Diastolic CHF. Echo (3/10) EF 45-50% with periapical akinesis. Mild LVH. Mild MR. Pseudonormal diastolic function. Echo (4/10): EF 65% with mild focal basal septal hypertrophy. Pseudonormal diastolic function. Normal wall motion. Moderate biatrial enlargement. PASP 61 mmHg. Echo (4/11): EF 55%, inferobasal hypokinesis, mild MR, PA systolic pressure 44 mmHg.  . Frequent falls   . Lacunar stroke     on CT  . Expressive aphasia syndrome     More likely atypical migraine than TIA. Carotid dopplers (8/09) with 40-60% LICA stenosis.  . Horner's syndrome       left  . SVT (supraventricular tachycardia)     transient  . Hypothyroidism   . Dementia     Social History   Social History  . Marital Status: Widowed    Spouse Name: N/A  . Number of Children: N/A  . Years of Education: N/A   Occupational History  . Not on file.   Social History Main Topics  . Smoking status: Former Smoker    Types: Cigarettes    Quit date: 05/25/1950  . Smokeless tobacco: Never Used  . Alcohol Use: No  . Drug Use: No  . Sexual Activity: Yes   Other Topics Concern  . Not on file   Social History Narrative    No past surgical history on file.  Family History  Problem Relation Age of Onset  . Hypertension Mother   . COPD Father     No Known Allergies  Current Outpatient Prescriptions on File Prior to Visit  Medication Sig Dispense Refill  . amLODipine (NORVASC) 10 MG tablet TAKE 1 TABLET BY MOUTH DAILY 90 tablet 0  . aspirin 81 MG tablet Take 162 mg by mouth daily.     [name] atorvastatin (LIPITOR) 20 MG tablet TAKE 1 TABLET BY MOUTH EVERY DAY 30 tablet 11  . carvedilol (COREG) 12.5 MG tablet TAKE 1 TABLET BY MOUTH TWICE A DAY 60 tablet 3  . cloNIDine (CATAPRES) 0.1 MG tablet TAKE 1 TABLET BY MOUTH 3 TIMES A DAY  270 tablet 1  . donepezil (ARICEPT) 10 MG tablet TAKE 1 TABLET (5 MG TOTAL) BY MOUTH AT BEDTIME. 30 tablet 11  . lactulose (CHRONULAC) 10 GM/15ML solution TAKE 15 MLS BY MOUTH DAILY FOR BOWELS. INCREASE TO 30 IF CONSTIPATED 240 mL 1  . levETIRAcetam (KEPPRA) 250 MG tablet TAKE 1 TABLET TWICE A DAY 180 tablet 0  . levothyroxine (SYNTHROID, LEVOTHROID) 25 MCG tablet TAKE 1 TABLET EVERY DAY 90 tablet 1  . nitroGLYCERIN (NITROSTAT) 0.4 MG SL tablet Place 0.4 mg under the tongue every 5 (five) minutes as needed for chest pain.     [name] sertraline (ZOLOFT) 25 MG tablet TAKE 1 TABLET BY MOUTH EVERY DAY 30 tablet 3  . valsartan (DIOVAN) 160 MG tablet TAKE 1 TABLET BY MOUTH TWICE A DAY 180 tablet 0  . Vitamin D, Ergocalciferol, (DRISDOL) 50000 UNITS  CAPS capsule TAKE ONE CAPSULE BY MOUTH WEEKLY 30 capsule 0   No current facility-administered medications on file prior to visit.    BP 110/80 mmHg  Pulse 58  Temp(Src) 97.9 F (36.6 C)  Wt 119 lb 11.2 oz (54.296 kg)  SpO2 97%       Objective:   Physical Exam  Constitutional: She is oriented to person, place, and time. She appears well-developed and well-nourished. No distress.  Eyes: Right eye exhibits no discharge. Left eye exhibits no discharge.  Cardiovascular: Normal rate, regular rhythm, normal heart sounds and intact distal pulses.  Exam reveals no gallop and no friction rub.   No murmur heard. Pulmonary/Chest: Effort normal and breath sounds normal. No respiratory distress. She has no wheezes. She has no rales. She exhibits no tenderness.  Abdominal: Soft. Bowel sounds are normal. She exhibits no distension and no mass. There is no tenderness. There is no rebound and no guarding.  Musculoskeletal: Normal range of motion. She exhibits no edema or tenderness.  Walks with a walker  Neurological: She is alert and oriented to person, place, and time.  Skin: Skin is warm and dry. She is not diaphoretic.  Psychiatric: She has a normal mood and affect. Her behavior is normal. Thought content normal.  Nursing note and vitals reviewed.      Assessment & Plan:  1. Essential hypertension - No change with medications - Basic metabolic panel - Hepatic function panel  2. Hypothyroidism, unspecified hypothyroidism type - TSH  3. Depression - Currently has no issues with depression.

## 2014-11-12 NOTE — Progress Notes (Signed)
Pre visit review using our clinic review tool, if applicable. No additional management support is needed unless otherwise documented below in the visit note. 

## 2014-11-20 ENCOUNTER — Other Ambulatory Visit: Payer: Self-pay | Admitting: Family

## 2014-11-24 ENCOUNTER — Telehealth: Payer: Self-pay | Admitting: Adult Health

## 2014-11-24 NOTE — Telephone Encounter (Signed)
Pt said she is having a problem trying to determining what is high in potassium. She is trying to cut back but would a list of foods if we have it that would help her out while trying to cut back on her potassium

## 2014-11-25 NOTE — Telephone Encounter (Signed)
Tim Lair spoke with pt's son and this has been resolved.

## 2014-12-04 ENCOUNTER — Other Ambulatory Visit: Payer: Self-pay | Admitting: Neurology

## 2014-12-04 ENCOUNTER — Other Ambulatory Visit: Payer: Self-pay | Admitting: Family

## 2014-12-05 ENCOUNTER — Other Ambulatory Visit: Payer: Self-pay | Admitting: Family

## 2014-12-06 ENCOUNTER — Other Ambulatory Visit: Payer: Self-pay | Admitting: Family

## 2014-12-20 ENCOUNTER — Ambulatory Visit
Admission: RE | Admit: 2014-12-20 | Discharge: 2014-12-20 | Disposition: A | Discharge status: Home / Self Care | Payer: Medicare Other | Source: Ambulatory Visit

## 2014-12-20 DIAGNOSIS — Z1231 Encounter for screening mammogram for malignant neoplasm of breast: Secondary | ICD-10-CM

## 2014-12-24 ENCOUNTER — Other Ambulatory Visit: Payer: Self-pay | Admitting: Family

## 2015-01-02 ENCOUNTER — Other Ambulatory Visit: Payer: Self-pay | Admitting: Cardiology

## 2015-02-04 ENCOUNTER — Other Ambulatory Visit: Payer: Self-pay | Admitting: Cardiology

## 2015-02-07 ENCOUNTER — Other Ambulatory Visit: Payer: Self-pay | Admitting: Cardiology

## 2015-02-07 ENCOUNTER — Ambulatory Visit (INDEPENDENT_AMBULATORY_CARE_PROVIDER_SITE_OTHER): Payer: Medicare Other | Admitting: Neurology

## 2015-02-07 ENCOUNTER — Encounter: Payer: Self-pay | Admitting: Neurology

## 2015-02-07 VITALS — BP 160/64 | HR 62 | Ht 59.0 in | Wt 117.0 lb

## 2015-02-07 DIAGNOSIS — R269 Unspecified abnormalities of gait and mobility: Secondary | ICD-10-CM | POA: Diagnosis not present

## 2015-02-07 DIAGNOSIS — I6523 Occlusion and stenosis of bilateral carotid arteries: Secondary | ICD-10-CM

## 2015-02-07 DIAGNOSIS — G40109 Localization-related (focal) (partial) symptomatic epilepsy and epileptic syndromes with simple partial seizures, not intractable, without status epilepticus: Secondary | ICD-10-CM | POA: Diagnosis not present

## 2015-02-07 DIAGNOSIS — R413 Other amnesia: Secondary | ICD-10-CM | POA: Diagnosis not present

## 2015-02-07 MED ORDER — MEMANTINE HCL 10 MG PO TABS: 10.0000 mg | ORAL_TABLET | Freq: Two times a day (BID) | ORAL | Status: DC

## 2015-02-07 MED ORDER — SERTRALINE HCL 50 MG PO TABS: 50.0000 mg | ORAL_TABLET | Freq: Every day | ORAL | Status: DC

## 2015-02-07 MED ORDER — SERTRALINE HCL 50 MG PO TABS: 25.0000 mg | ORAL_TABLET | Freq: Every day | ORAL | Status: DC

## 2015-02-07 NOTE — Progress Notes (Signed)
Chief Complaint  Patient presents with  . Memory Loss    MMSE - animals.  She is here with her son, [name], who is concerned about her declining memory.  Feels donepezil is no longer helpful.  . Seizures    Reports no seizure activity.      PATIENT: [name] DOB: 1920/04/13  HISTORICAL  [name]  is a 81  -year-old right-handed Caucasian female follow-up for seizure, and cognitive impairment   On June 25, 2008, she presented to the ER with complex partial seizure. At that time she reportedly had a few years history of intermittent episode of difficulty talking that might last about 30 minutes, but never experienced total loss of consciousness. However on April 30, she developed episode of difficulty talking again, followed by head deviation to the right side, whole-body tonic-clonic movements.  The events lasted a few minutes and then she had post-ictal confusion.  She had 3 similar recurrent episodes in 2 hours, followed by transient mild right-sided paralysis.  A MRI of the brain  revealed mild chronic small vessel disease but no acute lesions. She most likely has complex partial seizure stemming from the left frontal region.  She was started on Keppra 250 b.i.d and has no recurrent episodes. Because of her mild right side paralysis she was admitted to a nursing facility for a few months but recovered well and is now back at home. She lives alone with family support.   She is dong well, lives alone, independent in her daily activity, her son check on her regularly.  She also had a gradual onset memory trouble  UPDATE Feb 5th 2014. She is doing very well, no recurrent seizures, lives alone at her house, son checks on her regularly, she is taking keppra  bid. she enjoys reading, relies on a cane.  UPDATE May 22nd 2015: She has no recurrent seizure, gradual onset mild memory trouble, getting much worse during the most recent UTI, is on donepezil 5 mg every night, complains of  nightmare  UPDATE June 14th 2016: She is with her son at today's clinical visit, she had slow worsening memory trouble, was started on Aricept 5 mg daily by her primary care few months ago, which did help her, she complains of vivid dreams, no recurrent seizures.   UPDATE Feb 07 2015: She has her granddaughter CNA to take care of her at home, Her son reported that she gets agitated easily, she still enjoys reading, has daily routine, she eats meal regularly, sleeps well, increased gait difficulty, using a cane, had a risk to fall.  REVIEW OF SYSTEMS: Full 14 system review of systems performed and notable only for as above  ALLERGIES: No Known Allergies  HOME MEDICATIONS: Current Outpatient Prescriptions on File Prior to Visit  Medication Sig Dispense Refill  . amLODipine (NORVASC) 10 MG tablet TAKE 1 TABLET BY MOUTH DAILY 90 tablet 0  . aspirin 81 MG tablet Take 162 mg by mouth daily.     [name] atorvastatin (LIPITOR) 20 MG tablet TAKE 1 TABLET BY MOUTH EVERY DAY 30 tablet 11  . carvedilol (COREG) 12.5 MG tablet TAKE 1 TABLET BY MOUTH TWICE A DAY 60 tablet 6  . cloNIDine (CATAPRES) 0.1 MG tablet TAKE 1 TABLET BY MOUTH 3 TIMES A DAY 270 tablet 1  . donepezil (ARICEPT) 10 MG tablet TAKE 1 TABLET (5 MG TOTAL) BY MOUTH AT BEDTIME. 30 tablet 11  . lactulose, encephalopathy, (CHRONULAC) 10 GM/15ML SOLN TAKE 15 MLS BY  MOUTH DAILY FOR BOWELS. INCREASE TO 30 IF CONSTIPATED 240 mL 1  . levETIRAcetam (KEPPRA) 250 MG tablet TAKE 1 TABLET TWICE A DAY 180 tablet 0  . levothyroxine (SYNTHROID, LEVOTHROID) 25 MCG tablet TAKE 1 TABLET EVERY DAY 90 tablet 1  . nitroGLYCERIN (NITROSTAT) 0.4 MG SL tablet Place 0.4 mg under the tongue every 5 (five) minutes as needed for chest pain.     [name] sertraline (ZOLOFT) 25 MG tablet TAKE 1 TABLET BY MOUTH EVERY DAY 30 tablet 3  . valsartan (DIOVAN) 160 MG tablet TAKE 1 TABLET BY MOUTH TWICE A DAY 180 tablet 0  . Vitamin D, Ergocalciferol, (DRISDOL) 50000 UNITS CAPS capsule  TAKE ONE CAPSULE BY MOUTH WEEKLY 30 capsule 0   No current facility-administered medications on file prior to visit.    PAST MEDICAL HISTORY: Past Medical History  Diagnosis Date  . Hypertension   . Coronary artery disease     NSTEMI 3/10. LHC showed 80% mLAD, 90% mCFX. She had PCI with Endeavor 2.5 x 12 to LAD and Endeavor 3.0 x 12 to CFX  . CHF (congestive heart failure) (HCC)     Diastolic CHF. Echo (3/10) EF 45-50% with periapical akinesis. Mild LVH. Mild MR. Pseudonormal diastolic function. Echo (4/10): EF 65% with mild focal basal septal hypertrophy. Pseudonormal diastolic function. Normal wall motion. Moderate biatrial enlargement. PASP 61 mmHg. Echo (4/11): EF 55%, inferobasal hypokinesis, mild MR, PA systolic pressure 44 mmHg.  . Frequent falls   . Lacunar stroke (HCC)     on CT  . Expressive aphasia syndrome     More likely atypical migraine than TIA. Carotid dopplers (8/09) with 40-60% LICA stenosis.  . Horner's syndrome     left  . SVT (supraventricular tachycardia) (HCC)     transient  . Hypothyroidism   . Dementia     PAST SURGICAL HISTORY: No past surgical history on file.  FAMILY HISTORY: Family History  Problem Relation Age of Onset  . Hypertension Mother   . COPD Father     SOCIAL HISTORY:  Social History   Social History  . Marital Status: Widowed    Spouse Name: N/A  . Number of Children: N/A  . Years of Education: N/A   Occupational History  . Not on file.   Social History Main Topics  . Smoking status: Former Smoker    Types: Cigarettes    Quit date: 05/25/1950  . Smokeless tobacco: Never Used  . Alcohol Use: No  . Drug Use: No  . Sexual Activity: Yes   Other Topics Concern  . Not on file   Social History Narrative     PHYSICAL EXAM   Filed Vitals:   02/07/15 0826  BP: 160/64  Pulse: 62  Height:  (1.499 m)  Weight: 117 lb (53.071 kg)    Not recorded      Body mass index is 23.62 kg/(m^2).  PHYSICAL  EXAMNIATION:  Gen: NAD, conversant, well nourised, obese, well groomed                     Cardiovascular: Regular rate rhythm, no peripheral edema, warm, nontender. Eyes: Conjunctivae clear without exudates or hemorrhage Neck: Supple, no carotid bruise. Pulmonary: Clear to auscultation bilaterally   NEUROLOGICAL EXAM:  MENTAL STATUS: Speech:    Speech is normal; fluent and spontaneous with normal comprehension.  Cognition: MMSE 25/30, animal naming is 6     Orientation to time, place and person: She is not oriented to  date, month, County     recent and remote memory, she missed 2 out of 3 recalls     Normal Attention span and concentration     Normal Language, naming, Idahorepeating,spontaneous speech     Fund of knowledge     CRANIAL NERVES: CN II: Visual fields are full to confrontation.  Pupils are 4 mm and briskly reactive to light. Visual acuity is 20/20 bilaterally. CN III, IV, VI: extraocular movement are normal. No ptosis. CN V: Facial sensation is intact to pinprick in all 3 divisions bilaterally. Corneal responses are intact.  CN VII: Face is symmetric with normal eye closure and smile. CN VIII: Hearing is normal to rubbing fingers CN IX, X: Palate elevates symmetrically. Phonation is normal. CN XI: Head turning and shoulder shrug are intact CN XII: Tongue is midline with normal movements and no atrophy.  MOTOR: There is no pronator drift of out-stretched arms. Muscle bulk and tone are normal.  She has moderate bilateral ankle dorsiflexion weakness,  REFLEXES: Reflexes are 2+ and symmetric at the biceps, triceps, knees, and absent at  ankles. Plantar responses are flexor.  SENSORY:  Length dependent decreased light touch and pinprick   COORDINATION: Rapid alternating movements and fine finger movements are intact. There is no dysmetria on finger-to-nose and heel-knee-shin.   GAIT/STANCE: She need to push up to get up from seated position, bilateral foot drop, wide  based, mildly unsteady  DIAGNOSTIC DATA (LABS, IMAGING, TESTING) - I reviewed patient records, labs, notes, testing and imaging myself where available.   ASSESSMENT AND PLAN  [name] is a 81 y.o. female  Mild Cognitive impairment  MMSE 25/30,   Keep aricetp 10mg  daily, Namenda 10mg  bid. Complex partial seizure  Continue Keppra 250mg  bid Gait Difficulty  She has bilateral ankle dorsi flexion weakness, most consistent with lumbar radiculopathy,   Home physical therapy   Levert FeinsteinYijun Angelino Rumery, M.D. Ph.D.  [facility] [address], [address] [place], [place] [id number] [phone]

## 2015-02-11 ENCOUNTER — Encounter: Payer: Medicare Other | Admitting: Adult Health

## 2015-02-11 DIAGNOSIS — I5030 Unspecified diastolic (congestive) heart failure: Secondary | ICD-10-CM | POA: Diagnosis not present

## 2015-02-11 DIAGNOSIS — Z87891 Personal history of nicotine dependence: Secondary | ICD-10-CM | POA: Diagnosis not present

## 2015-02-11 DIAGNOSIS — G902 Horner's syndrome: Secondary | ICD-10-CM | POA: Diagnosis not present

## 2015-02-11 DIAGNOSIS — I110 Hypertensive heart disease with heart failure: Secondary | ICD-10-CM | POA: Diagnosis not present

## 2015-02-11 DIAGNOSIS — Z7982 Long term (current) use of aspirin: Secondary | ICD-10-CM | POA: Diagnosis not present

## 2015-02-11 DIAGNOSIS — G40909 Epilepsy, unspecified, not intractable, without status epilepticus: Secondary | ICD-10-CM | POA: Diagnosis not present

## 2015-02-11 DIAGNOSIS — Z9181 History of falling: Secondary | ICD-10-CM | POA: Diagnosis not present

## 2015-02-11 DIAGNOSIS — F0390 Unspecified dementia without behavioral disturbance: Secondary | ICD-10-CM | POA: Diagnosis not present

## 2015-02-11 DIAGNOSIS — R531 Weakness: Secondary | ICD-10-CM | POA: Diagnosis not present

## 2015-02-11 DIAGNOSIS — Z8673 Personal history of transient ischemic attack (TIA), and cerebral infarction without residual deficits: Secondary | ICD-10-CM | POA: Diagnosis not present

## 2015-02-11 DIAGNOSIS — R4701 Aphasia: Secondary | ICD-10-CM | POA: Diagnosis not present

## 2015-02-11 DIAGNOSIS — I2510 Atherosclerotic heart disease of native coronary artery without angina pectoris: Secondary | ICD-10-CM | POA: Diagnosis not present

## 2015-02-14 ENCOUNTER — Encounter (HOSPITAL_COMMUNITY): Payer: Self-pay | Admitting: *Deleted

## 2015-02-14 ENCOUNTER — Telehealth: Payer: Self-pay | Admitting: Neurology

## 2015-02-14 ENCOUNTER — Emergency Department (HOSPITAL_COMMUNITY)
Admission: EM | Admit: 2015-02-14 | Discharge: 2015-02-14 | Disposition: A | Discharge status: Home / Self Care | Payer: Medicare Other | Attending: Emergency Medicine | Admitting: Emergency Medicine

## 2015-02-14 DIAGNOSIS — Z8673 Personal history of transient ischemic attack (TIA), and cerebral infarction without residual deficits: Secondary | ICD-10-CM | POA: Insufficient documentation

## 2015-02-14 DIAGNOSIS — N390 Urinary tract infection, site not specified: Secondary | ICD-10-CM | POA: Diagnosis not present

## 2015-02-14 DIAGNOSIS — Z87891 Personal history of nicotine dependence: Secondary | ICD-10-CM | POA: Insufficient documentation

## 2015-02-14 DIAGNOSIS — Z79899 Other long term (current) drug therapy: Secondary | ICD-10-CM | POA: Insufficient documentation

## 2015-02-14 DIAGNOSIS — F0390 Unspecified dementia without behavioral disturbance: Secondary | ICD-10-CM | POA: Insufficient documentation

## 2015-02-14 DIAGNOSIS — I2510 Atherosclerotic heart disease of native coronary artery without angina pectoris: Secondary | ICD-10-CM | POA: Insufficient documentation

## 2015-02-14 DIAGNOSIS — R404 Transient alteration of awareness: Secondary | ICD-10-CM | POA: Diagnosis not present

## 2015-02-14 DIAGNOSIS — E039 Hypothyroidism, unspecified: Secondary | ICD-10-CM | POA: Diagnosis not present

## 2015-02-14 DIAGNOSIS — I10 Essential (primary) hypertension: Secondary | ICD-10-CM | POA: Insufficient documentation

## 2015-02-14 DIAGNOSIS — I509 Heart failure, unspecified: Secondary | ICD-10-CM | POA: Diagnosis not present

## 2015-02-14 DIAGNOSIS — R5383 Other fatigue: Secondary | ICD-10-CM | POA: Insufficient documentation

## 2015-02-14 DIAGNOSIS — H9190 Unspecified hearing loss, unspecified ear: Secondary | ICD-10-CM | POA: Diagnosis not present

## 2015-02-14 DIAGNOSIS — Z7982 Long term (current) use of aspirin: Secondary | ICD-10-CM | POA: Insufficient documentation

## 2015-02-14 DIAGNOSIS — R55 Syncope and collapse: Secondary | ICD-10-CM | POA: Diagnosis not present

## 2015-02-14 LAB — CBC
HCT: 36.7 % (ref 36.0–46.0)
HEMOGLOBIN: 12.1 g/dL (ref 12.0–15.0)
MCH: 30.1 pg (ref 26.0–34.0)
MCHC: 33 g/dL (ref 30.0–36.0)
MCV: 91.3 fL (ref 78.0–100.0)
Platelets: 234 10*3/uL (ref 150–400)
RBC: 4.02 MIL/uL (ref 3.87–5.11)
RDW: 14.2 % (ref 11.5–15.5)
WBC: 10.7 10*3/uL — ABNORMAL HIGH (ref 4.0–10.5)

## 2015-02-14 LAB — URINE MICROSCOPIC-ADD ON

## 2015-02-14 LAB — BASIC METABOLIC PANEL
ANION GAP: 10 (ref 5–15)
BUN: 24 mg/dL — ABNORMAL HIGH (ref 6–20)
CALCIUM: 9.4 mg/dL (ref 8.9–10.3)
CO2: 25 mmol/L (ref 22–32)
Chloride: 104 mmol/L (ref 101–111)
Creatinine, Ser: 1.34 mg/dL — ABNORMAL HIGH (ref 0.44–1.00)
GFR, EST AFRICAN AMERICAN: 39 mL/min — AB (ref >60)
GFR, EST NON AFRICAN AMERICAN: 33 mL/min — AB (ref >60)
Glucose, Bld: 100 mg/dL — ABNORMAL HIGH (ref 65–99)
Potassium: 3.9 mmol/L (ref 3.5–5.1)
Sodium: 139 mmol/L (ref 135–145)

## 2015-02-14 LAB — URINALYSIS, ROUTINE W REFLEX MICROSCOPIC
Bilirubin Urine: NEGATIVE
Glucose, UA: NEGATIVE mg/dL
Ketones, ur: NEGATIVE mg/dL
NITRITE: NEGATIVE
Protein, ur: 100 mg/dL — AB
SPECIFIC GRAVITY, URINE: 1.013 (ref 1.005–1.030)
pH: 6 (ref 5.0–8.0)

## 2015-02-14 LAB — TSH: TSH: 2.732 u[IU]/mL (ref 0.350–4.500)

## 2015-02-14 MED ORDER — CEPHALEXIN 500 MG PO CAPS: 500.0000 mg | ORAL_CAPSULE | Freq: Two times a day (BID) | ORAL | Status: DC

## 2015-02-14 MED ORDER — CEPHALEXIN 500 MG PO CAPS: 500.0000 mg | ORAL_CAPSULE | Freq: Four times a day (QID) | ORAL | Status: DC

## 2015-02-14 NOTE — ED Notes (Signed)
Pt arrives from home via [facility] c/o weakness and near syncope. Pt denies dizziness/SOB. A/O x 2, hx of dementia. Pt states that she has had an increase in her urination, denies odor.

## 2015-02-14 NOTE — ED Notes (Signed)
Pt is in stable condition upon d/c and is escorted from ED via wheelchair. 

## 2015-02-14 NOTE — Telephone Encounter (Signed)
Amy/[facility] [phone] called to get verbal approval today to continue 2 x a week for 5 weeks, then 1 x week for 2 weeks.

## 2015-02-14 NOTE — ED Provider Notes (Signed)
CSN: [id number]     Arrival date & time 02/14/15  1334 History   First MD Initiated Contact with Patient 02/14/15 1339     Chief Complaint  Patient presents with  . Near Syncope     (Consider location/radiation/quality/duration/timing/severity/associated sxs/prior Treatment) HPI [name] is a 81 year old woman with memory problems, HTN, CAD (s/p PCI 2010), complex partial seizures who presents after an episode of "presyncope." She was joined by her son [name] and granddaughter/CNA Faith. Reportedly, Faith had set up her ingredients so that she could make herself a sandwich and when she came back a few minutes, she was found lying her head on the counter while standing. Her knees had buckled, and Faith helped her to the ground. The patient was conversant during the episode, which lasted approximately 5 minutes; however, the patient does not recall the incident, which is consistent with her baseline short-term, episodic amnesia per the family. The son reports there have been recent medication changes on her neurology visit on 12/12, with the addition of Namenda and increasing her Sertraline dose. The son reports that her mood symptoms have improved, but her memory problems have persisted on this regimen. Notably, she had not had any medications on 12/18, but she has had all of her medications today. The family reports that she is now at her baseline.   Past Medical History  Diagnosis Date  . Hypertension   . Coronary artery disease     NSTEMI 3/10. LHC showed 80% mLAD, 90% mCFX. She had PCI with Endeavor 2.5 x 12 to LAD and Endeavor 3.0 x 12 to CFX  . CHF (congestive heart failure) (HCC)     Diastolic CHF. Echo (3/10) EF 45-50% with periapical akinesis. Mild LVH. Mild MR. Pseudonormal diastolic function. Echo (4/10): EF 65% with mild focal basal septal hypertrophy. Pseudonormal diastolic function. Normal wall motion. Moderate biatrial enlargement. PASP 61 mmHg. Echo (4/11): EF 55%, inferobasal  hypokinesis, mild MR, PA systolic pressure 44 mmHg.  . Frequent falls   . Lacunar stroke (HCC)     on CT  . Expressive aphasia syndrome     More likely atypical migraine than TIA. Carotid dopplers (8/09) with 40-60% LICA stenosis.  . Horner's syndrome     left  . SVT (supraventricular tachycardia) (HCC)     transient  . Hypothyroidism   . Dementia    History reviewed. No pertinent past surgical history. Family History  Problem Relation Age of Onset  . Hypertension Mother   . COPD Father    Social History  Substance Use Topics  . Smoking status: Former Smoker    Types: Cigarettes    Quit date: 05/25/1950  . Smokeless tobacco: Never Used  . Alcohol Use: No   OB History    No data available     Review of Systems  Constitutional: Negative for fever, chills, activity change and appetite change.  HENT: Positive for hearing loss. Negative for congestion, rhinorrhea and sore throat.   Eyes: Negative for visual disturbance.  Respiratory: Negative for cough, shortness of breath and wheezing.   Cardiovascular: Positive for leg swelling. Negative for chest pain and palpitations.  Gastrointestinal: Negative for nausea, abdominal pain and diarrhea.       Gas.  Endocrine: Negative for cold intolerance, heat intolerance and polyuria.  Genitourinary: Negative for dysuria.  Skin: Negative for color change and pallor.  Neurological: Negative for dizziness, seizures, speech difficulty, light-headedness, numbness and headaches.  Psychiatric/Behavioral: Positive for confusion and agitation.  Allergies  Review of patient's allergies indicates no known allergies.  Home Medications   Prior to Admission medications   Medication Sig Start Date End Date Taking? Authorizing Provider  amLODipine (NORVASC) 10 MG tablet TAKE 1 TABLET BY MOUTH DAILY 02/08/15   Laurey Moralealton S McLean, MD  aspirin 81 MG tablet Take 162 mg by mouth daily.     Historical Provider, MD  atorvastatin (LIPITOR) 20 MG  tablet TAKE 1 TABLET BY MOUTH EVERY DAY 09/16/14   Dyann KiefMichele M Lenze, PA-C  carvedilol (COREG) 12.5 MG tablet TAKE 1 TABLET BY MOUTH TWICE A DAY 01/03/15   Laurey Moralealton S McLean, MD  cloNIDine (CATAPRES) 0.1 MG tablet TAKE 1 TABLET BY MOUTH 3 TIMES A DAY 12/08/14   Shirline Freesory Nafziger, NP  donepezil (ARICEPT) 10 MG tablet TAKE 1 TABLET (5 MG TOTAL) BY MOUTH AT BEDTIME. 08/10/14   Levert FeinsteinYijun Yan, MD  lactulose, encephalopathy, (CHRONULAC) 10 GM/15ML SOLN TAKE 15 MLS BY MOUTH DAILY FOR BOWELS. INCREASE TO 30 IF CONSTIPATED 12/27/14   Eulis FosterPadonda B Webb, FNP  levETIRAcetam (KEPPRA) 250 MG tablet TAKE 1 TABLET TWICE A DAY 12/05/14   Levert FeinsteinYijun Yan, MD  levothyroxine (SYNTHROID, LEVOTHROID) 25 MCG tablet TAKE 1 TABLET EVERY DAY 12/08/14   Shirline Freesory Nafziger, NP  memantine (NAMENDA) 10 MG tablet Take 1 tablet (10 mg total) by mouth 2 (two) times daily. 02/07/15   Levert FeinsteinYijun Yan, MD  nitroGLYCERIN (NITROSTAT) 0.4 MG SL tablet Place 0.4 mg under the tongue every 5 (five) minutes as needed for chest pain.     Historical Provider, MD  sertraline (ZOLOFT) 50 MG tablet Take 1 tablet (50 mg total) by mouth daily. 02/07/15   Levert FeinsteinYijun Yan, MD  valsartan (DIOVAN) 160 MG tablet TAKE 1 TABLET BY MOUTH TWICE A DAY 02/07/15   Laurey Moralealton S McLean, MD  Vitamin D, Ergocalciferol, (DRISDOL) 50000 UNITS CAPS capsule TAKE ONE CAPSULE BY MOUTH WEEKLY 11/23/14   Shirline Freesory Nafziger, NP   BP 161/50 mmHg  Pulse 58  Temp(Src) 98.4 F (36.9 C) (Oral)  Resp 15  Ht 4\' 11"  (1.499 m)  Wt 49.442 kg  BMI 22.00 kg/m2  SpO2 99% Physical Exam  Constitutional: She is oriented to person, place, and time.  Frail appearing elderly woman.  HENT:  Mouth/Throat: Oropharynx is clear and moist. No oropharyngeal exudate.  Eyes: EOM are normal. Pupils are equal, round, and reactive to light. No scleral icterus.  Neck: Neck supple. No JVD present. No thyromegaly present.  Cardiovascular: Normal rate, regular rhythm and normal heart sounds.   Pulmonary/Chest: Effort normal and breath sounds  normal. No respiratory distress. She has no wheezes. She has no rales.  Abdominal: Soft. Bowel sounds are normal. She exhibits no distension. There is no tenderness.  No suprapubic tenderness.  Lymphadenopathy:    She has no cervical adenopathy.  Neurological: She is alert and oriented to person, place, and time. She has normal reflexes.  Recalled 1 of 3 items after 5 minutes. 5/5 strength in all extremities. Finger to nose normal.   Skin: Skin is warm and dry.  Psychiatric:  Perseverates.  Vitals reviewed.   ED Course  Procedures (including critical care time) Labs Review Labs Reviewed  BASIC METABOLIC PANEL - Abnormal; Notable for the following:    Glucose, Bld 100 (*)    BUN 24 (*)    Creatinine, Ser 1.34 (*)    GFR calc non Af Amer 33 (*)    GFR calc Af Amer 39 (*)    All other components within  normal limits  CBC - Abnormal; Notable for the following:    WBC 10.7 (*)    All other components within normal limits  URINALYSIS, ROUTINE W REFLEX MICROSCOPIC (NOT AT [facility])  CBG MONITORING, ED    Imaging Review No results found. I have personally reviewed and evaluated these images and lab results as part of my medical decision-making.   EKG Interpretation None      MDM   Final diagnoses:  None   Patient has had a recent medication change with the addition of Namenda, which appears to be minimally beneficial. Son reports Zoloft increase has worked well. Low suspicion for infection given that she has been afebrile. Mild leukocytosis to 10.7 without respiratory symptoms. BMET unremarkable. Last TSH in 10/2014. TSH and UA pending.  UA demonstrated small leukocytes with many bacteria on microscopy. TSH was normal. She was discharged with a five day course of Keflex 500 mg BID in good condition.    Pricilla Loveless, MD 02/18/15 [phone]

## 2015-02-14 NOTE — Telephone Encounter (Signed)
Received a verbal order from Dr. Vickey Hugerohmeier (WID for Dr. Terrace ArabiaYan today) for pt continue HH 2x week for 5 weeks, then 1 x week for 2 weeks.  [facility] and relayed this order. Amy verbalized understanding.

## 2015-02-14 NOTE — Discharge Instructions (Signed)
[name]. It was a pleasure to meet you. It's possible your episode of fatigue may be related to a new medication, Namenda. We have discontinued this medication, but please be sure to follow-up with your neurologist. Please seek medical attention if you lose consciousness or are newly confused and it does not get better.

## 2015-02-16 DIAGNOSIS — R531 Weakness: Secondary | ICD-10-CM | POA: Diagnosis not present

## 2015-02-16 DIAGNOSIS — I2510 Atherosclerotic heart disease of native coronary artery without angina pectoris: Secondary | ICD-10-CM | POA: Diagnosis not present

## 2015-02-16 DIAGNOSIS — I5030 Unspecified diastolic (congestive) heart failure: Secondary | ICD-10-CM | POA: Diagnosis not present

## 2015-02-16 DIAGNOSIS — G40909 Epilepsy, unspecified, not intractable, without status epilepticus: Secondary | ICD-10-CM | POA: Diagnosis not present

## 2015-02-16 DIAGNOSIS — I110 Hypertensive heart disease with heart failure: Secondary | ICD-10-CM | POA: Diagnosis not present

## 2015-02-16 DIAGNOSIS — F0390 Unspecified dementia without behavioral disturbance: Secondary | ICD-10-CM | POA: Diagnosis not present

## 2015-02-18 DIAGNOSIS — F0390 Unspecified dementia without behavioral disturbance: Secondary | ICD-10-CM | POA: Diagnosis not present

## 2015-02-18 DIAGNOSIS — R531 Weakness: Secondary | ICD-10-CM | POA: Diagnosis not present

## 2015-02-18 DIAGNOSIS — I5030 Unspecified diastolic (congestive) heart failure: Secondary | ICD-10-CM | POA: Diagnosis not present

## 2015-02-18 DIAGNOSIS — I110 Hypertensive heart disease with heart failure: Secondary | ICD-10-CM | POA: Diagnosis not present

## 2015-02-18 DIAGNOSIS — G40909 Epilepsy, unspecified, not intractable, without status epilepticus: Secondary | ICD-10-CM | POA: Diagnosis not present

## 2015-02-18 DIAGNOSIS — I2510 Atherosclerotic heart disease of native coronary artery without angina pectoris: Secondary | ICD-10-CM | POA: Diagnosis not present

## 2015-02-24 DIAGNOSIS — R531 Weakness: Secondary | ICD-10-CM | POA: Diagnosis not present

## 2015-02-24 DIAGNOSIS — G40909 Epilepsy, unspecified, not intractable, without status epilepticus: Secondary | ICD-10-CM | POA: Diagnosis not present

## 2015-02-24 DIAGNOSIS — I2510 Atherosclerotic heart disease of native coronary artery without angina pectoris: Secondary | ICD-10-CM | POA: Diagnosis not present

## 2015-02-24 DIAGNOSIS — F0390 Unspecified dementia without behavioral disturbance: Secondary | ICD-10-CM | POA: Diagnosis not present

## 2015-02-24 DIAGNOSIS — I5030 Unspecified diastolic (congestive) heart failure: Secondary | ICD-10-CM | POA: Diagnosis not present

## 2015-02-24 DIAGNOSIS — I110 Hypertensive heart disease with heart failure: Secondary | ICD-10-CM | POA: Diagnosis not present

## 2015-02-25 DIAGNOSIS — G40909 Epilepsy, unspecified, not intractable, without status epilepticus: Secondary | ICD-10-CM | POA: Diagnosis not present

## 2015-02-25 DIAGNOSIS — F0390 Unspecified dementia without behavioral disturbance: Secondary | ICD-10-CM | POA: Diagnosis not present

## 2015-02-25 DIAGNOSIS — I5030 Unspecified diastolic (congestive) heart failure: Secondary | ICD-10-CM | POA: Diagnosis not present

## 2015-02-25 DIAGNOSIS — I110 Hypertensive heart disease with heart failure: Secondary | ICD-10-CM | POA: Diagnosis not present

## 2015-02-25 DIAGNOSIS — R531 Weakness: Secondary | ICD-10-CM | POA: Diagnosis not present

## 2015-02-25 DIAGNOSIS — I2510 Atherosclerotic heart disease of native coronary artery without angina pectoris: Secondary | ICD-10-CM | POA: Diagnosis not present

## 2015-03-01 DIAGNOSIS — I2510 Atherosclerotic heart disease of native coronary artery without angina pectoris: Secondary | ICD-10-CM | POA: Diagnosis not present

## 2015-03-01 DIAGNOSIS — I110 Hypertensive heart disease with heart failure: Secondary | ICD-10-CM | POA: Diagnosis not present

## 2015-03-01 DIAGNOSIS — F0390 Unspecified dementia without behavioral disturbance: Secondary | ICD-10-CM | POA: Diagnosis not present

## 2015-03-01 DIAGNOSIS — G40909 Epilepsy, unspecified, not intractable, without status epilepticus: Secondary | ICD-10-CM | POA: Diagnosis not present

## 2015-03-01 DIAGNOSIS — I5030 Unspecified diastolic (congestive) heart failure: Secondary | ICD-10-CM | POA: Diagnosis not present

## 2015-03-01 DIAGNOSIS — R531 Weakness: Secondary | ICD-10-CM | POA: Diagnosis not present

## 2015-03-03 ENCOUNTER — Telehealth: Payer: Self-pay | Admitting: Adult Health

## 2015-03-03 DIAGNOSIS — G40909 Epilepsy, unspecified, not intractable, without status epilepticus: Secondary | ICD-10-CM | POA: Diagnosis not present

## 2015-03-03 DIAGNOSIS — I2510 Atherosclerotic heart disease of native coronary artery without angina pectoris: Secondary | ICD-10-CM | POA: Diagnosis not present

## 2015-03-03 DIAGNOSIS — R531 Weakness: Secondary | ICD-10-CM | POA: Diagnosis not present

## 2015-03-03 DIAGNOSIS — F0390 Unspecified dementia without behavioral disturbance: Secondary | ICD-10-CM | POA: Diagnosis not present

## 2015-03-03 DIAGNOSIS — I5030 Unspecified diastolic (congestive) heart failure: Secondary | ICD-10-CM | POA: Diagnosis not present

## 2015-03-03 DIAGNOSIS — I110 Hypertensive heart disease with heart failure: Secondary | ICD-10-CM | POA: Diagnosis not present

## 2015-03-03 NOTE — Telephone Encounter (Signed)
Patient Name: [name] DOB: 04/15/1920 Initial Comment Caller states mother is having bad nightmares, thinks due to med change.Has questions about lowering dose back to where it was. Nurse Assessment Guidelines Guideline Title Affirmed Question Affirmed Notes Final Disposition User FINAL ATTEMPT MADE - message left Yetta BarreJones, Charity fundraiserN, Marshall & IlsleyMiranda

## 2015-03-03 NOTE — Telephone Encounter (Signed)
Pts neurologist bumped her zoloft up to 50mg  and son wants to lower it back down to 25mg . The 50mg  is giving her nightmares. Please leave a detailed message for him b/c he cannot keep his cell phone on at work.

## 2015-03-03 NOTE — Telephone Encounter (Signed)
Called and spoke with pt's son [name].  He is aware of Dr. Lucie LeatherBurchette's recommendations and he states he has been trying to get in contact with neurology but they will not return his call.  The reason the medication was increased is because pt was getting agitatted.  Per Gerlene Burdockichard they will just have to deal with pt getting agitated because her body cannot handle the 50 mg of Zoloft.  Advised pt's son I would try to contact neurology for them on 1.6.2017 and call him back.  He states I can leave him a voicemail because he cannot pick up his phone on the floor.

## 2015-03-03 NOTE — Telephone Encounter (Signed)
If neurologist increased this medication I feel they should contact the neurologist to give them the feedback.

## 2015-03-04 ENCOUNTER — Telehealth: Payer: Self-pay | Admitting: Neurology

## 2015-03-04 NOTE — Telephone Encounter (Signed)
Called and spoke with [facility] and advised that pt is having problems with the increase in Zoloft.  Per [facility] pt's son will have to call the office to have medication changed.  Richard's information was given to the office and they will contact him.

## 2015-03-04 NOTE — Telephone Encounter (Signed)
[name] called to advise that Dr. Terrace ArabiaYan recently increased sertraline (ZOLOFT) 50 MG tablet to 50 mg due to agitation. [name] states patient can't deal with 50 mg., she was having really bad dreams that were scaring her to death, [name] has decreased the medication back down to 25 mg. And wanted Dr. Terrace ArabiaYan to be aware.

## 2015-03-04 NOTE — Telephone Encounter (Signed)
Called and left a message for pt's son that Neurology wanted him to contact them.

## 2015-03-07 ENCOUNTER — Other Ambulatory Visit: Payer: Self-pay | Admitting: Family

## 2015-03-08 NOTE — Telephone Encounter (Signed)
It is Ok to decrease zoloft from 50mg  to 25mg  daily

## 2015-03-10 DIAGNOSIS — F0390 Unspecified dementia without behavioral disturbance: Secondary | ICD-10-CM | POA: Diagnosis not present

## 2015-03-10 DIAGNOSIS — R531 Weakness: Secondary | ICD-10-CM | POA: Diagnosis not present

## 2015-03-10 DIAGNOSIS — G40909 Epilepsy, unspecified, not intractable, without status epilepticus: Secondary | ICD-10-CM | POA: Diagnosis not present

## 2015-03-10 DIAGNOSIS — I5030 Unspecified diastolic (congestive) heart failure: Secondary | ICD-10-CM | POA: Diagnosis not present

## 2015-03-10 DIAGNOSIS — I110 Hypertensive heart disease with heart failure: Secondary | ICD-10-CM | POA: Diagnosis not present

## 2015-03-10 DIAGNOSIS — I2510 Atherosclerotic heart disease of native coronary artery without angina pectoris: Secondary | ICD-10-CM | POA: Diagnosis not present

## 2015-03-11 DIAGNOSIS — F0390 Unspecified dementia without behavioral disturbance: Secondary | ICD-10-CM | POA: Diagnosis not present

## 2015-03-11 DIAGNOSIS — R531 Weakness: Secondary | ICD-10-CM | POA: Diagnosis not present

## 2015-03-11 DIAGNOSIS — I110 Hypertensive heart disease with heart failure: Secondary | ICD-10-CM | POA: Diagnosis not present

## 2015-03-11 DIAGNOSIS — I2510 Atherosclerotic heart disease of native coronary artery without angina pectoris: Secondary | ICD-10-CM | POA: Diagnosis not present

## 2015-03-11 DIAGNOSIS — I5030 Unspecified diastolic (congestive) heart failure: Secondary | ICD-10-CM | POA: Diagnosis not present

## 2015-03-11 DIAGNOSIS — G40909 Epilepsy, unspecified, not intractable, without status epilepticus: Secondary | ICD-10-CM | POA: Diagnosis not present

## 2015-03-16 DIAGNOSIS — I2510 Atherosclerotic heart disease of native coronary artery without angina pectoris: Secondary | ICD-10-CM | POA: Diagnosis not present

## 2015-03-16 DIAGNOSIS — I110 Hypertensive heart disease with heart failure: Secondary | ICD-10-CM | POA: Diagnosis not present

## 2015-03-16 DIAGNOSIS — G40909 Epilepsy, unspecified, not intractable, without status epilepticus: Secondary | ICD-10-CM | POA: Diagnosis not present

## 2015-03-16 DIAGNOSIS — F0390 Unspecified dementia without behavioral disturbance: Secondary | ICD-10-CM | POA: Diagnosis not present

## 2015-03-16 DIAGNOSIS — I5030 Unspecified diastolic (congestive) heart failure: Secondary | ICD-10-CM | POA: Diagnosis not present

## 2015-03-16 DIAGNOSIS — R531 Weakness: Secondary | ICD-10-CM | POA: Diagnosis not present

## 2015-03-18 DIAGNOSIS — I2510 Atherosclerotic heart disease of native coronary artery without angina pectoris: Secondary | ICD-10-CM | POA: Diagnosis not present

## 2015-03-18 DIAGNOSIS — I5030 Unspecified diastolic (congestive) heart failure: Secondary | ICD-10-CM | POA: Diagnosis not present

## 2015-03-18 DIAGNOSIS — F0390 Unspecified dementia without behavioral disturbance: Secondary | ICD-10-CM | POA: Diagnosis not present

## 2015-03-18 DIAGNOSIS — I110 Hypertensive heart disease with heart failure: Secondary | ICD-10-CM | POA: Diagnosis not present

## 2015-03-18 DIAGNOSIS — R531 Weakness: Secondary | ICD-10-CM | POA: Diagnosis not present

## 2015-03-18 DIAGNOSIS — G40909 Epilepsy, unspecified, not intractable, without status epilepticus: Secondary | ICD-10-CM | POA: Diagnosis not present

## 2015-03-22 ENCOUNTER — Ambulatory Visit (INDEPENDENT_AMBULATORY_CARE_PROVIDER_SITE_OTHER): Payer: Medicare Other | Admitting: Adult Health

## 2015-03-22 ENCOUNTER — Encounter: Payer: Self-pay | Admitting: Adult Health

## 2015-03-22 VITALS — BP 108/60 | Temp 98.6°F | Ht 59.0 in | Wt 111.8 lb

## 2015-03-22 DIAGNOSIS — R8290 Unspecified abnormal findings in urine: Secondary | ICD-10-CM | POA: Diagnosis not present

## 2015-03-22 DIAGNOSIS — Z23 Encounter for immunization: Secondary | ICD-10-CM

## 2015-03-22 DIAGNOSIS — Z0000 Encounter for general adult medical examination without abnormal findings: Secondary | ICD-10-CM

## 2015-03-22 DIAGNOSIS — I10 Essential (primary) hypertension: Secondary | ICD-10-CM | POA: Diagnosis not present

## 2015-03-22 DIAGNOSIS — R8299 Other abnormal findings in urine: Secondary | ICD-10-CM | POA: Diagnosis not present

## 2015-03-22 LAB — POCT URINALYSIS DIPSTICK
BILIRUBIN UA: NEGATIVE
GLUCOSE UA: NEGATIVE
KETONES UA: NEGATIVE
Nitrite, UA: NEGATIVE
SPEC GRAV UA: 1.015
Urobilinogen, UA: 0.2
pH, UA: 6

## 2015-03-22 LAB — BASIC METABOLIC PANEL
BUN: 26 mg/dL — AB (ref 6–23)
CHLORIDE: 104 meq/L (ref 96–112)
CO2: 29 meq/L (ref 19–32)
CREATININE: 1.32 mg/dL — AB (ref 0.40–1.20)
Calcium: 9.4 mg/dL (ref 8.4–10.5)
GFR: 39.95 mL/min — ABNORMAL LOW (ref >60.00)
GLUCOSE: 104 mg/dL — AB (ref 70–99)
Potassium: 5.2 mEq/L — ABNORMAL HIGH (ref 3.5–5.1)
Sodium: 142 mEq/L (ref 135–145)

## 2015-03-22 LAB — CBC WITH DIFFERENTIAL/PLATELET
BASOS PCT: 0.6 % (ref 0.0–3.0)
Basophils Absolute: 0.1 10*3/uL (ref 0.0–0.1)
EOS PCT: 3.2 % (ref 0.0–5.0)
Eosinophils Absolute: 0.3 10*3/uL (ref 0.0–0.7)
HEMATOCRIT: 34.6 % — AB (ref 36.0–46.0)
Hemoglobin: 11.5 g/dL — ABNORMAL LOW (ref 12.0–15.0)
Lymphocytes Relative: 21.4 % (ref 12.0–46.0)
Lymphs Abs: 2 10*3/uL (ref 0.7–4.0)
MCHC: 33.3 g/dL (ref 30.0–36.0)
MCV: 90.4 fl (ref 78.0–100.0)
MONOS PCT: 8 % (ref 3.0–12.0)
Monocytes Absolute: 0.8 10*3/uL (ref 0.1–1.0)
Neutro Abs: 6.4 10*3/uL (ref 1.4–7.7)
Neutrophils Relative %: 66.8 % (ref 43.0–77.0)
Platelets: 264 10*3/uL (ref 150.0–400.0)
RBC: 3.83 Mil/uL — AB (ref 3.87–5.11)
RDW: 14.6 % (ref 11.5–15.5)
WBC: 9.6 10*3/uL (ref 4.0–10.5)

## 2015-03-22 NOTE — Progress Notes (Signed)
Pre visit review using our clinic review tool, if applicable. No additional management support is needed unless otherwise documented below in the visit note. 

## 2015-03-22 NOTE — Addendum Note (Signed)
Addended by: Azucena Freed on: 03/22/2015 11:29 AM   Modules accepted: Orders

## 2015-03-22 NOTE — Progress Notes (Signed)
Subjective:  Patient presents today for their annual wellness visit. She is a pleasant 82 year old caucasian female who  has a past medical history of Hypertension; Coronary artery disease; CHF (congestive heart failure) (HCC); Frequent falls; Lacunar stroke (HCC); Expressive aphasia syndrome; Horner's syndrome; SVT (supraventricular tachycardia) (HCC); Hypothyroidism; and Dementia. Her grand daughter Rushie Goltz is present at this exam.   She continues to live in her home by herself. She does have a caregiver/granddaughter who is at the house from 8:30 am - 4:30 pm 4 days a week.   Patient endorses feeling well and has no complaints.   There was some concern about nightmares after her Zoloft was increased from 25 mg to 50 mg by Neurology. She has dropped back down to 25 mg and no longer has nightmares.   Denies any chest pain, SOB, dizziness, falls.   Her only interval history is that of an ER visit on 02/14/2015 for presyncope. She did not fall and was discharged home from the ER.   She last saw Neurology on 02/07/15, MME 25/30, she was continued on Aricept 10 mg and Namenda 10 mg BID. Home PT was ordered for bilateral ankle dorsi flexion weakness. PT will continue for the next two weeks.   Her cognitive impairment seems to be at baseline.   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 Walks with a walker.   Cardiac risk factors:  advanced age (older than 54 for men, 70 for women)  No Hyperlipidemia  No diabetes.  Family History: Hypertension  Depression Screen None. PHQ2 0   Activities of Daily Living - Does not drive anymore and needs help balancing check book.   Hearing Difficulties: Failed hearing test in room  Cognitive Testing No reported trouble.   Normal 3 word  recall  List the Names of Other Physician/Practitioners you currently use: 1.Dr. Terrace Arabia- Neurology  Immunization History  Administered Date(s) Administered  . Influenza Split 11/26/2011  . Influenza Whole 11/27/1998, 12/30/2006, 12/05/2007, 01/17/2009, 01/10/2010  . Influenza,inj,Quad PF,36+ Mos 01/28/2013  . Pneumococcal Polysaccharide-23 02/26/2005  . Td 02/26/2005   Required Immunizations needed today Flu and Pneumovax 23  Screening tests- up to date Health Maintenance Due  Topic Date Due  . DEXA SCAN  07/28/1987  . PNA vac Low Risk Adult (2 of 2 - PCV13) 02/26/2006  . INFLUENZA VACCINE  09/27/2014  . TETANUS/TDAP  02/27/2015    ROS- No pertinent positives discovered in course of AWV  The following were reviewed and entered/updated in epic: Past Medical History  Diagnosis Date  . Hypertension   . Coronary artery disease     NSTEMI 3/10. LHC showed 80% mLAD, 90% mCFX. She had PCI with Endeavor 2.5 x 12 to LAD and Endeavor 3.0 x 12 to CFX  . CHF (congestive heart failure) (HCC)     Diastolic CHF. Echo (3/10) EF 45-50% with periapical akinesis. Mild LVH. Mild MR. Pseudonormal diastolic function. Echo (4/10): EF 65% with mild focal basal septal hypertrophy. Pseudonormal diastolic function. Normal wall motion. Moderate biatrial enlargement. PASP 61 mmHg. Echo (4/11): EF 55%, inferobasal hypokinesis, mild MR, PA systolic pressure 44 mmHg.  . Frequent falls   . Lacunar stroke (HCC)     on CT  . Expressive aphasia syndrome     More likely atypical migraine than TIA. Carotid dopplers (8/09) with 40-60%  LICA stenosis.  . Horner's syndrome     left  . SVT (supraventricular tachycardia) (HCC)     transient  . Hypothyroidism   . Dementia    Patient Active Problem List   Diagnosis Date Noted  . Abnormality of gait 02/07/2015  . Memory loss 07/17/2013  . Seizure disorder, focal motor (HCC) 03/26/2012  . Carotid stenosis 06/26/2011  . Depression 05/10/2010  .  HYPERLIPIDEMIA-MIXED 11/05/2008  . DIASTOLIC HEART FAILURE, CHRONIC 09/17/2008  . UNSPECIFIED VENOUS INSUFFICIENCY 06/09/2008  . CORONARY ATHEROSCLEROSIS NATIVE CORONARY ARTERY 05/12/2008  . Hypothyroidism 12/05/2007  . CONSTIPATION, DRUG INDUCED 09/02/2007  . BREAST CYST 09/02/2007  . UNS ADVRS EFF UNS RX MEDICINAL&BIOLOGICAL SBSTNC 06/04/2007  . Essential hypertension 07/25/2006  . OSTEOPOROSIS 07/25/2006  . CEREBROVASCULAR ACCIDENT, HX OF 07/25/2006   No past surgical history on file.  Family History  Problem Relation Age of Onset  . Hypertension Mother   . COPD Father     Medications- reviewed and updated Current Outpatient Prescriptions  Medication Sig Dispense Refill  . amLODipine (NORVASC) 10 MG tablet TAKE 1 TABLET BY MOUTH DAILY 90 tablet 0  . aspirin 81 MG tablet Take 162 mg by mouth daily.     [name] atorvastatin (LIPITOR) 20 MG tablet TAKE 1 TABLET BY MOUTH EVERY DAY 30 tablet 11  . carvedilol (COREG) 12.5 MG tablet TAKE 1 TABLET BY MOUTH TWICE A DAY 60 tablet 6  . cloNIDine (CATAPRES) 0.1 MG tablet TAKE 1 TABLET BY MOUTH 3 TIMES A DAY 270 tablet 1  . donepezil (ARICEPT) 10 MG tablet TAKE 1 TABLET (5 MG TOTAL) BY MOUTH AT BEDTIME. 30 tablet 11  . lactulose, encephalopathy, (CHRONULAC) 10 GM/15ML SOLN TAKE 15 MLS BY MOUTH DAILY FOR BOWELS. INCREASE TO 30 IF CONSTIPATED 240 mL 1  . levETIRAcetam (KEPPRA) 250 MG tablet TAKE 1 TABLET TWICE A DAY 180 tablet 0  . levothyroxine (SYNTHROID, LEVOTHROID) 25 MCG tablet TAKE 1 TABLET EVERY DAY 90 tablet 1  . nitroGLYCERIN (NITROSTAT) 0.4 MG SL tablet Place 0.4 mg under the tongue every 5 (five) minutes as needed for chest pain.     [name] sertraline (ZOLOFT) 50 MG tablet Take 1 tablet (50 mg total) by mouth daily. 30 tablet 11  . valsartan (DIOVAN) 160 MG tablet TAKE 1 TABLET BY MOUTH TWICE A DAY 180 tablet 0  . Vitamin D, Ergocalciferol, (DRISDOL) 50000 UNITS CAPS capsule TAKE ONE CAPSULE BY MOUTH WEEKLY 30 capsule 0   No current  facility-administered medications for this visit.    Allergies-reviewed and updated No Known Allergies  Social History   Social History  . Marital Status: Widowed    Spouse Name: N/A  . Number of Children: N/A  . Years of Education: N/A   Social History Main Topics  . Smoking status: Former Smoker    Types: Cigarettes    Quit date: 05/25/1950  . Smokeless tobacco: Never Used  . Alcohol Use: No  . Drug Use: No  . Sexual Activity: Yes   Other Topics Concern  . None   Social History Narrative    Objective: BP 108/60 mmHg  Temp(Src) 98.6 F (37 C) (Oral)  Ht  (1.499 m)  Wt 111 lb 12.8 oz (50.712 kg)  BMI 22.57 kg/m2 Constitutional: She is oriented and conversant. She appears well-developed and well-nourished. No distress.  Eyes: Right eye exhibits no discharge. Left eye exhibits no discharge.  Cardiovascular: Normal rate, regular rhythm, normal heart sounds and intact distal pulses. Exam reveals no  gallop and no friction rub.  No murmur heard. Pulmonary/Chest: Effort normal and breath sounds normal. No respiratory distress. She has no wheezes. She has no rales. She exhibits no tenderness.  Abdominal: Soft. Bowel sounds are normal. She exhibits no distension and no mass. There is no tenderness. There is no rebound and no guarding.  Musculoskeletal: Normal range of motion. She exhibits no edema or tenderness.  Walks with a walker  Neurological: She is alert and oriented to person, place, and time.  Skin: Skin is warm and dry. She is not diaphoretic.  Psychiatric: She has a normal mood and affect. Her behavior is normal. Thought content normal.  Nursing note and vitals reviewed.  Assessment/Plan:  1. Essential hypertension - CBC with Differential/Platelet - POCT urinalysis dipstick - Basic metabolic panel - Hypotensive today. Cut back clonidine from TID to BID - Monitor at home.  - Will follow up with on labs.   2. Medicare annual wellness visit,  subsequent - Follow up in 6 months or sooner if needed - Continue to stay active.   3. Needs flu shot - Flu vaccine HIGH DOSE PF (Fluzone High dose)  4. Need for prophylactic vaccination against Streptococcus pneumoniae (pneumococcus) - Pneumococcal conjugate vaccine 13-valent    Return precautions advised.

## 2015-03-22 NOTE — Patient Instructions (Signed)
It was great seeing you today!  You got your second pneumonia shot as well as your flu shot today  I would like you go take your Clonidine twice a day instead of three days. Monitor your blood pressure at home. If it goes up above 140/90 then restart the Clonodine three times a day.   Follow up with me in six months.

## 2015-03-23 DIAGNOSIS — F0390 Unspecified dementia without behavioral disturbance: Secondary | ICD-10-CM | POA: Diagnosis not present

## 2015-03-23 DIAGNOSIS — R531 Weakness: Secondary | ICD-10-CM | POA: Diagnosis not present

## 2015-03-23 DIAGNOSIS — I110 Hypertensive heart disease with heart failure: Secondary | ICD-10-CM | POA: Diagnosis not present

## 2015-03-23 DIAGNOSIS — I2510 Atherosclerotic heart disease of native coronary artery without angina pectoris: Secondary | ICD-10-CM | POA: Diagnosis not present

## 2015-03-23 DIAGNOSIS — G40909 Epilepsy, unspecified, not intractable, without status epilepticus: Secondary | ICD-10-CM | POA: Diagnosis not present

## 2015-03-23 DIAGNOSIS — I5030 Unspecified diastolic (congestive) heart failure: Secondary | ICD-10-CM | POA: Diagnosis not present

## 2015-03-24 LAB — URINE CULTURE

## 2015-03-28 ENCOUNTER — Other Ambulatory Visit: Payer: Self-pay | Admitting: Adult Health

## 2015-03-31 DIAGNOSIS — R531 Weakness: Secondary | ICD-10-CM | POA: Diagnosis not present

## 2015-03-31 DIAGNOSIS — I110 Hypertensive heart disease with heart failure: Secondary | ICD-10-CM | POA: Diagnosis not present

## 2015-03-31 DIAGNOSIS — I5030 Unspecified diastolic (congestive) heart failure: Secondary | ICD-10-CM | POA: Diagnosis not present

## 2015-03-31 DIAGNOSIS — F0390 Unspecified dementia without behavioral disturbance: Secondary | ICD-10-CM | POA: Diagnosis not present

## 2015-03-31 DIAGNOSIS — I2510 Atherosclerotic heart disease of native coronary artery without angina pectoris: Secondary | ICD-10-CM | POA: Diagnosis not present

## 2015-03-31 DIAGNOSIS — G40909 Epilepsy, unspecified, not intractable, without status epilepticus: Secondary | ICD-10-CM | POA: Diagnosis not present

## 2015-04-11 ENCOUNTER — Encounter: Payer: Self-pay | Admitting: Family Medicine

## 2015-04-11 ENCOUNTER — Ambulatory Visit (INDEPENDENT_AMBULATORY_CARE_PROVIDER_SITE_OTHER): Payer: Medicare Other | Admitting: Family Medicine

## 2015-04-11 VITALS — BP 120/70 | HR 58 | Temp 97.5°F | Wt 113.1 lb

## 2015-04-11 DIAGNOSIS — J069 Acute upper respiratory infection, unspecified: Secondary | ICD-10-CM | POA: Diagnosis not present

## 2015-04-11 DIAGNOSIS — R05 Cough: Secondary | ICD-10-CM | POA: Diagnosis not present

## 2015-04-11 DIAGNOSIS — R059 Cough, unspecified: Secondary | ICD-10-CM

## 2015-04-11 NOTE — Progress Notes (Signed)
Subjective:    Patient ID: [name], female    DOB: Nov 20, 1920, 82 y.o.   MRN: [id number]  HPI  [name] is a 82 year old female who presents today with rhinitis, nonproductive cough, and post nasal drip. Symptoms started 1 week ago and she denies fever, chills, sweats, sinus pain, ear pressure/pain, and tooth pain.She denies symptoms of reflux and dysphagia. No recent sick contacts and no history of asthma or bronchitis. Pertinent history of seasonal allergies is noted. No treatments have been tried at home before this visit. She is accompanied by her granddaughter. Granddaughter stated that she wanted her lungs checked due to cough for 1 week.   Review of Systems  Constitutional: Negative for fever, chills and fatigue.  HENT: Positive for postnasal drip and rhinorrhea.   Respiratory: Positive for cough. Negative for chest tightness and shortness of breath.   Cardiovascular: Negative for chest pain and palpitations.  Gastrointestinal: Negative for nausea, vomiting, abdominal pain, diarrhea and constipation.  Genitourinary: Negative for dysuria.  Musculoskeletal: Negative for myalgias and arthralgias.  Skin: Negative for pallor.  Allergic/Immunologic: Positive for environmental allergies.  Neurological: Negative for dizziness, light-headedness and headaches.   Past Medical History  Diagnosis Date  . Hypertension   . Coronary artery disease     NSTEMI 3/10. LHC showed 80% mLAD, 90% mCFX. She had PCI with Endeavor 2.5 x 12 to LAD and Endeavor 3.0 x 12 to CFX  . CHF (congestive heart failure) (HCC)     Diastolic CHF. Echo (3/10) EF 45-50% with periapical akinesis. Mild LVH. Mild MR. Pseudonormal diastolic function. Echo (4/10): EF 65% with mild focal basal septal hypertrophy. Pseudonormal diastolic function. Normal wall motion. Moderate biatrial enlargement. PASP 61 mmHg. Echo (4/11): EF 55%, inferobasal hypokinesis, mild MR, PA systolic pressure 44 mmHg.  . Frequent falls   . Lacunar  stroke (HCC)     on CT  . Expressive aphasia syndrome     More likely atypical migraine than TIA. Carotid dopplers (8/09) with 40-60% LICA stenosis.  . Horner's syndrome     left  . SVT (supraventricular tachycardia) (HCC)     transient  . Hypothyroidism   . Dementia     Social History   Social History  . Marital Status: Widowed    Spouse Name: N/A  . Number of Children: N/A  . Years of Education: N/A   Occupational History  . Not on file.   Social History Main Topics  . Smoking status: Former Smoker    Types: Cigarettes    Quit date: 05/25/1950  . Smokeless tobacco: Never Used  . Alcohol Use: No  . Drug Use: No  . Sexual Activity: Yes   Other Topics Concern  . Not on file   Social History Narrative    No past surgical history on file.  Family History  Problem Relation Age of Onset  . Hypertension Mother   . COPD Father     No Known Allergies  Current Outpatient Prescriptions on File Prior to Visit  Medication Sig Dispense Refill  . amLODipine (NORVASC) 10 MG tablet TAKE 1 TABLET BY MOUTH DAILY 90 tablet 0  . aspirin 81 MG tablet Take 162 mg by mouth daily.     [name] atorvastatin (LIPITOR) 20 MG tablet TAKE 1 TABLET BY MOUTH EVERY DAY 30 tablet 11  . carvedilol (COREG) 12.5 MG tablet TAKE 1 TABLET BY MOUTH TWICE A DAY 60 tablet 6  . cloNIDine (CATAPRES) 0.1 MG tablet TAKE 1  TABLET BY MOUTH 3 TIMES A DAY 270 tablet 1  . donepezil (ARICEPT) 10 MG tablet TAKE 1 TABLET (5 MG TOTAL) BY MOUTH AT BEDTIME. 30 tablet 11  . lactulose, encephalopathy, (CHRONULAC) 10 GM/15ML SOLN TAKE 15 MLS BY MOUTH DAILY FOR BOWELS. INCREASE TO 30 IF CONSTIPATED 240 mL 1  . levETIRAcetam (KEPPRA) 250 MG tablet TAKE 1 TABLET TWICE A DAY 180 tablet 0  . levothyroxine (SYNTHROID, LEVOTHROID) 25 MCG tablet TAKE 1 TABLET EVERY DAY 90 tablet 1  . nitroGLYCERIN (NITROSTAT) 0.4 MG SL tablet Place 0.4 mg under the tongue every 5 (five) minutes as needed for chest pain.     [name] sertraline (ZOLOFT)  25 MG tablet TAKE 1 TABLET EVERY DAY 30 tablet 5  . valsartan (DIOVAN) 160 MG tablet TAKE 1 TABLET BY MOUTH TWICE A DAY 180 tablet 0  . Vitamin D, Ergocalciferol, (DRISDOL) 50000 UNITS CAPS capsule TAKE ONE CAPSULE BY MOUTH WEEKLY 30 capsule 0   No current facility-administered medications on file prior to visit.    BP 120/70 mmHg  Pulse 58  Temp(Src) 97.5 F (36.4 C) (Oral)  Wt 113 lb 1.6 oz (51.302 kg)  SpO2 96%     Objective:   Physical Exam  Constitutional: She is oriented to person, place, and time.  HENT:  Left Ear: Tympanic membrane normal.  Nose: Rhinorrhea present. Right sinus exhibits no maxillary sinus tenderness and no frontal sinus tenderness. Left sinus exhibits no maxillary sinus tenderness and no frontal sinus tenderness.  Unable to visualize Right TM due to cerumen. No ear pain/pressure noted by patient. Patient is HOH  Eyes: Pupils are equal, round, and reactive to light.  Cardiovascular: Normal rate, regular rhythm and intact distal pulses.   Mild nonpitting edema noted in ankles  Pulmonary/Chest: Effort normal and breath sounds normal. She has no wheezes. She has no rales.  Abdominal: Soft. Bowel sounds are normal. There is no tenderness.  Musculoskeletal:  Ambulates with walker.  Lymphadenopathy:    She has no cervical adenopathy.  Neurological: She is alert and oriented to person, place, and time.  Skin: Skin is warm and dry.  Psychiatric: She has a normal mood and affect.          Assessment & Plan:  1. Cough Nonproductive cough that does not interfere with activities or sleep. Mucinex DM for cough as needed. Increase fluids and rest.  2. Viral upper respiratory illness Supportive care such as increased fluids, rest, and use of OTC Mucinex DM for symptoms. Advised patient and granddaughter to follow up for further evaluation if symptoms do not improve in 3-4 days, worsen, or she develops a fever >101.

## 2015-04-11 NOTE — Patient Instructions (Signed)
Can consider ear wax softening drops to assist with wax removal. Also, mucinex DM can be used for cough. If symptoms do not improve in 3-4 days, worsen, or you develop a fever >101, please follow up with PCP.  Upper Respiratory Infection, Adult Most upper respiratory infections (URIs) are a viral infection of the air passages leading to the lungs. A URI affects the nose, throat, and upper air passages. The most common type of URI is nasopharyngitis and is typically referred to as "the common cold." URIs run their course and usually go away on their own. Most of the time, a URI does not require medical attention, but sometimes a bacterial infection in the upper airways can follow a viral infection. This is called a secondary infection. Sinus and middle ear infections are common types of secondary upper respiratory infections. Bacterial pneumonia can also complicate a URI. A URI can worsen asthma and chronic obstructive pulmonary disease (COPD). Sometimes, these complications can require emergency medical care and may be life threatening.  CAUSES Almost all URIs are caused by viruses. A virus is a type of germ and can spread from one person to another.  RISKS FACTORS You may be at risk for a URI if:   You smoke.   You have chronic heart or lung disease.  You have a weakened defense (immune) system.   You are very young or very old.   You have nasal allergies or asthma.  You work in crowded or poorly ventilated areas.  You work in health care facilities or schools. SIGNS AND SYMPTOMS  Symptoms typically develop 2-3 days after you come in contact with a cold virus. Most viral URIs last 7-10 days. However, viral URIs from the influenza virus (flu virus) can last 14-18 days and are typically more severe. Symptoms may include:   Runny or stuffy (congested) nose.   Sneezing.   Cough.   Sore throat.   Headache.   Fatigue.   Fever.   Loss of appetite.   Pain in your  forehead, behind your eyes, and over your cheekbones (sinus pain).  Muscle aches.  DIAGNOSIS  Your health care provider may diagnose a URI by:  Physical exam.  Tests to check that your symptoms are not due to another condition such as:  Strep throat.  Sinusitis.  Pneumonia.  Asthma. TREATMENT  A URI goes away on its own with time. It cannot be cured with medicines, but medicines may be prescribed or recommended to relieve symptoms. Medicines may help:  Reduce your fever.  Reduce your cough.  Relieve nasal congestion. HOME CARE INSTRUCTIONS   Take medicines only as directed by your health care provider.   Gargle warm saltwater or take cough drops to comfort your throat as directed by your health care provider.  Use a warm mist humidifier or inhale steam from a shower to increase air moisture. This may make it easier to breathe.  Drink enough fluid to keep your urine clear or pale yellow.   Eat soups and other clear broths and maintain good nutrition.   Rest as needed.   Return to work when your temperature has returned to normal or as your health care provider advises. You may need to stay home longer to avoid infecting others. You can also use a face mask and careful hand washing to prevent spread of the virus.  Increase the usage of your inhaler if you have asthma.   Do not use any tobacco products, including cigarettes, chewing tobacco, or  electronic cigarettes. If you need help quitting, ask your health care provider. PREVENTION  The best way to protect yourself from getting a cold is to practice good hygiene.   Avoid oral or hand contact with people with cold symptoms.   Wash your hands often if contact occurs.  There is no clear evidence that vitamin C, vitamin E, echinacea, or exercise reduces the chance of developing a cold. However, it is always recommended to get plenty of rest, exercise, and practice good nutrition.  SEEK MEDICAL CARE IF:   You  are getting worse rather than better.   Your symptoms are not controlled by medicine.   You have chills.  You have worsening shortness of breath.  You have brown or red mucus.  You have yellow or brown nasal discharge.  You have pain in your face, especially when you bend forward.  You have a fever.  You have swollen neck glands.  You have pain while swallowing.  You have white areas in the back of your throat. SEEK IMMEDIATE MEDICAL CARE IF:   You have severe or persistent:  Headache.  Ear pain.  Sinus pain.  Chest pain.  You have chronic lung disease and any of the following:  Wheezing.  Prolonged cough.  Coughing up blood.  A change in your usual mucus.  You have a stiff neck.  You have changes in your:  Vision.  Hearing.  Thinking.  Mood. MAKE SURE YOU:   Understand these instructions.  Will watch your condition.  Will get help right away if you are not doing well or get worse.   This information is not intended to replace advice given to you by your health care provider. Make sure you discuss any questions you have with your health care provider.   Document Released: 08/08/2000 Document Revised: 06/29/2014 Document Reviewed: 05/20/2013 Elsevier Interactive Patient Education [employer].

## 2015-04-11 NOTE — Progress Notes (Signed)
Pre visit review using our clinic review tool, if applicable. No additional management support is needed unless otherwise documented below in the visit note. 

## 2015-04-19 ENCOUNTER — Encounter: Payer: Self-pay | Admitting: Internal Medicine

## 2015-04-19 ENCOUNTER — Ambulatory Visit (INDEPENDENT_AMBULATORY_CARE_PROVIDER_SITE_OTHER): Payer: Medicare Other | Admitting: Internal Medicine

## 2015-04-19 VITALS — BP 130/64 | Temp 97.9°F | Wt 115.1 lb

## 2015-04-19 DIAGNOSIS — IMO0001 Reserved for inherently not codable concepts without codable children: Secondary | ICD-10-CM

## 2015-04-19 DIAGNOSIS — R413 Other amnesia: Secondary | ICD-10-CM

## 2015-04-19 DIAGNOSIS — R54 Age-related physical debility: Secondary | ICD-10-CM

## 2015-04-19 DIAGNOSIS — N390 Urinary tract infection, site not specified: Secondary | ICD-10-CM | POA: Diagnosis not present

## 2015-04-19 DIAGNOSIS — R82998 Other abnormal findings in urine: Secondary | ICD-10-CM

## 2015-04-19 DIAGNOSIS — I5032 Chronic diastolic (congestive) heart failure: Secondary | ICD-10-CM

## 2015-04-19 DIAGNOSIS — R42 Dizziness and giddiness: Secondary | ICD-10-CM

## 2015-04-19 DIAGNOSIS — R410 Disorientation, unspecified: Secondary | ICD-10-CM | POA: Diagnosis not present

## 2015-04-19 LAB — POC URINALSYSI DIPSTICK (AUTOMATED)
Bilirubin, UA: NEGATIVE
Blood, UA: NEGATIVE
GLUCOSE UA: NEGATIVE
KETONES UA: NEGATIVE
Nitrite, UA: NEGATIVE
Protein, UA: NEGATIVE
Urobilinogen, UA: 0.2
pH, UA: 6

## 2015-04-19 NOTE — Progress Notes (Signed)
Pre visit review using our clinic review tool, if applicable. No additional management support is needed unless otherwise documented below in the visit note.   Chief Complaint  Patient presents with  . Confusion  . Dizziness    HPI: Patient [name]  comes in today for SDA for  new problem evaluation. PCP  NA   Here with family  Member with concern  Last week  Seemed  More forgetful.    Last week  And  Waxing and waning and change .  But persistent over weekend No falling  Or fevers  .     booked as ?uti  But has above sx  And wanted to make sure  Didn't  have this  . No dysuria hematuria incontinence  . No sob obvious  ? If  Weak leg     slumpmed over the sink doing dishes  But non focal   Per family . And talking nl for her . ROS: See pertinent positives and negatives per HPI.walks with walker  Out   Cane in house .  No noted vision change change in appetite cough  But had resp illness weekes ago now better . No new meds  Cough or otherwise noted  Past Medical History  Diagnosis Date  . Hypertension   . Coronary artery disease     NSTEMI 3/10. LHC showed 80% mLAD, 90% mCFX. She had PCI with Endeavor 2.5 x 12 to LAD and Endeavor 3.0 x 12 to CFX  . CHF (congestive heart failure) (HCC)     Diastolic CHF. Echo (3/10) EF 45-50% with periapical akinesis. Mild LVH. Mild MR. Pseudonormal diastolic function. Echo (4/10): EF 65% with mild focal basal septal hypertrophy. Pseudonormal diastolic function. Normal wall motion. Moderate biatrial enlargement. PASP 61 mmHg. Echo (4/11): EF 55%, inferobasal hypokinesis, mild MR, PA systolic pressure 44 mmHg.  . Frequent falls   . Lacunar stroke (HCC)     on CT  . Expressive aphasia syndrome     More likely atypical migraine than TIA. Carotid dopplers (8/09) with 40-60% LICA stenosis.  . Horner's syndrome     left  . SVT (supraventricular tachycardia) (HCC)     transient  . Hypothyroidism   . Dementia     Family History  Problem Relation Age  of Onset  . Hypertension Mother   . COPD Father     Social History   Social History  . Marital Status: Widowed    Spouse Name: N/A  . Number of Children: N/A  . Years of Education: N/A   Social History Main Topics  . Smoking status: Former Smoker    Types: Cigarettes    Quit date: 05/25/1950  . Smokeless tobacco: Never Used  . Alcohol Use: No  . Drug Use: No  . Sexual Activity: Yes   Other Topics Concern  . None   Social History Narrative    Outpatient Prescriptions Prior to Visit  Medication Sig Dispense Refill  . amLODipine (NORVASC) 10 MG tablet TAKE 1 TABLET BY MOUTH DAILY 90 tablet 0  . aspirin 81 MG tablet Take 162 mg by mouth daily.     [name] atorvastatin (LIPITOR) 20 MG tablet TAKE 1 TABLET BY MOUTH EVERY DAY 30 tablet 11  . carvedilol (COREG) 12.5 MG tablet TAKE 1 TABLET BY MOUTH TWICE A DAY 60 tablet 6  . cloNIDine (CATAPRES) 0.1 MG tablet TAKE 1 TABLET BY MOUTH 3 TIMES A DAY 270 tablet 1  . donepezil (ARICEPT) 10  MG tablet TAKE 1 TABLET (5 MG TOTAL) BY MOUTH AT BEDTIME. 30 tablet 11  . lactulose, encephalopathy, (CHRONULAC) 10 GM/15ML SOLN TAKE 15 MLS BY MOUTH DAILY FOR BOWELS. INCREASE TO 30 IF CONSTIPATED 240 mL 1  . levETIRAcetam (KEPPRA) 250 MG tablet TAKE 1 TABLET TWICE A DAY 180 tablet 0  . levothyroxine (SYNTHROID, LEVOTHROID) 25 MCG tablet TAKE 1 TABLET EVERY DAY 90 tablet 1  . nitroGLYCERIN (NITROSTAT) 0.4 MG SL tablet Place 0.4 mg under the tongue every 5 (five) minutes as needed for chest pain.     [name] sertraline (ZOLOFT) 25 MG tablet TAKE 1 TABLET EVERY DAY 30 tablet 5  . valsartan (DIOVAN) 160 MG tablet TAKE 1 TABLET BY MOUTH TWICE A DAY 180 tablet 0  . Vitamin D, Ergocalciferol, (DRISDOL) 50000 UNITS CAPS capsule TAKE ONE CAPSULE BY MOUTH WEEKLY 30 capsule 0   No facility-administered medications prior to visit.     EXAM:  BP 130/64 mmHg  Temp(Src) 97.9 F (36.6 C) (Oral)  Wt 115 lb 1.6 oz (52.209 kg)  Body mass index is 23.23  kg/(m^2).  GENERAL: vitals reviewed and listed above, alert,  appears well hydrated and in no acute distressable to name  Her gdaughter . sasys she is fine walks fast with walker and turns to sit down  HEENT: atraumatic, conjunctiva  clear, no obvious abnormalities on inspection of external nose and ears some waxOP : no lesion edema or exudate  NECK: no obvious masses on inspection palpation  No jvd noted  LUNGS: clear to auscultation bilaterally, no wheezes, rales or rhonchi, kyphosis  No point tendernss CV: HRRR, no clubbing cyanosis has vv no sig  peripheral edema nl cap refill  Rate 60  MS: moves all extremities without noticeable focal  Abnormality  djd findings  Skin puprlish area right shin ( says old) and faint yellow ? Bruise faded on back  No tenderness PSYCH: pleasant and cooperative, no obvious depression or anxiety acutely   Says she is fine for 92 and 1/2  Lab Results  Component Value Date   WBC 9.6 03/22/2015   HGB 11.5* 03/22/2015   HCT 34.6* 03/22/2015   PLT 264.0 03/22/2015   GLUCOSE 104* 03/22/2015   CHOL 113 08/21/2013   TRIG 125.0 08/21/2013   HDL 65.00 08/21/2013   LDLDIRECT 36.0 03/22/2009   LDLCALC 23 08/21/2013   ALT 16 11/12/2014   AST 20 11/12/2014   NA 142 03/22/2015   K 5.2* 03/22/2015   CL 104 03/22/2015   CREATININE 1.32* 03/22/2015   BUN 26* 03/22/2015   CO2 29 03/22/2015   TSH 2.732 02/14/2015   INR 1.0 06/25/2008   HGBA1C  06/25/2008    5.1 (NOTE) The ADA recommends the following therapeutic goal for glycemic control related to Hgb A1c measurement: Goal of therapy: <6.5 Hgb A1c  Reference: American Diabetes Association: Clinical Practice Recommendations 2010, Diabetes Care, 2010, 33: (Suppl  1).  ua 2 + leuk  cx    ASSESSMENT AND PLAN:  Discussed the following assessment and plan:  Confusion - Plan: POCT Urinalysis Dipstick (Automated), Urine culture  Dizzy - Plan: POCT Urinalysis Dipstick (Automated), Urine culture  Leukocytes in urine  - Plan: Urine culture  Age factor  Memory loss  DIASTOLIC HEART FAILURE, CHRONIC - no acute findings Change in status but alert  dont know her baseline  Hx of uti   But doubt   uti as cause of any sx today   By hx   Last  Labs stable enough  Encouraged fluids  observ fall prevnetion   Will contact when u cs back  If  persistent or progressive sx  Then fu with PCP  -Patient advised to return or notify health care team  if symptoms worsen ,persist or new concerns arise.  Patient Instructions  Exam looks ok today .  Vital signs are stable. Today   Will do urine culture  And let you know results .    To check for UTI .   Continue  avoid  Medications    That could fog brain.     Neta Mends. Panosh M.D.

## 2015-04-19 NOTE — Patient Instructions (Signed)
Exam looks ok today .  Vital signs are stable. Today   Will do urine culture  And let you know results .    To check for UTI .   Continue  avoid  Medications    That could fog brain.

## 2015-04-21 LAB — URINE CULTURE
Colony Count: NO GROWTH
Organism ID, Bacteria: NO GROWTH

## 2015-04-22 ENCOUNTER — Telehealth: Payer: Self-pay | Admitting: *Deleted

## 2015-04-22 NOTE — Telephone Encounter (Signed)
Left detailed message on machine for patient with lab result per son's request.

## 2015-05-02 ENCOUNTER — Other Ambulatory Visit: Payer: Self-pay | Admitting: Cardiology

## 2015-05-09 ENCOUNTER — Other Ambulatory Visit: Payer: Self-pay | Admitting: Adult Health

## 2015-05-18 ENCOUNTER — Other Ambulatory Visit: Payer: Self-pay | Admitting: Adult Health

## 2015-05-18 NOTE — Telephone Encounter (Signed)
Ok to refill 

## 2015-05-19 NOTE — Telephone Encounter (Signed)
Ok to refill for 3 months. I would like to get a vitamin D level after that

## 2015-06-01 ENCOUNTER — Other Ambulatory Visit: Payer: Self-pay | Admitting: Adult Health

## 2015-06-03 ENCOUNTER — Other Ambulatory Visit: Payer: Self-pay | Admitting: Neurology

## 2015-06-14 DIAGNOSIS — H2513 Age-related nuclear cataract, bilateral: Secondary | ICD-10-CM | POA: Diagnosis not present

## 2015-07-12 ENCOUNTER — Telehealth: Payer: Self-pay | Admitting: *Deleted

## 2015-07-12 NOTE — Telephone Encounter (Signed)
[name] [phone]  [name] YAN  [phone] * [phone]  CXL 6/14 APPT @ 1045, PLS CB TO RESCHEDULE

## 2015-07-21 ENCOUNTER — Other Ambulatory Visit: Payer: Self-pay | Admitting: Cardiology

## 2015-07-21 ENCOUNTER — Other Ambulatory Visit: Payer: Self-pay | Admitting: Neurology

## 2015-07-29 ENCOUNTER — Other Ambulatory Visit: Payer: Self-pay | Admitting: Cardiology

## 2015-08-02 ENCOUNTER — Encounter (HOSPITAL_COMMUNITY): Payer: Self-pay | Admitting: Emergency Medicine

## 2015-08-02 ENCOUNTER — Other Ambulatory Visit: Payer: Self-pay | Admitting: Neurology

## 2015-08-02 ENCOUNTER — Emergency Department (HOSPITAL_COMMUNITY)
Admission: EM | Admit: 2015-08-02 | Discharge: 2015-08-02 | Disposition: A | Discharge status: Home / Self Care | Payer: Medicare Other | Attending: Emergency Medicine | Admitting: Emergency Medicine

## 2015-08-02 ENCOUNTER — Emergency Department (HOSPITAL_COMMUNITY): Payer: Medicare Other

## 2015-08-02 DIAGNOSIS — Z8673 Personal history of transient ischemic attack (TIA), and cerebral infarction without residual deficits: Secondary | ICD-10-CM | POA: Diagnosis not present

## 2015-08-02 DIAGNOSIS — Y998 Other external cause status: Secondary | ICD-10-CM | POA: Insufficient documentation

## 2015-08-02 DIAGNOSIS — I5030 Unspecified diastolic (congestive) heart failure: Secondary | ICD-10-CM | POA: Insufficient documentation

## 2015-08-02 DIAGNOSIS — Y9389 Activity, other specified: Secondary | ICD-10-CM | POA: Diagnosis not present

## 2015-08-02 DIAGNOSIS — R296 Repeated falls: Secondary | ICD-10-CM | POA: Diagnosis not present

## 2015-08-02 DIAGNOSIS — I10 Essential (primary) hypertension: Secondary | ICD-10-CM | POA: Insufficient documentation

## 2015-08-02 DIAGNOSIS — S0990XA Unspecified injury of head, initial encounter: Secondary | ICD-10-CM | POA: Diagnosis not present

## 2015-08-02 DIAGNOSIS — Y92009 Unspecified place in unspecified non-institutional (private) residence as the place of occurrence of the external cause: Secondary | ICD-10-CM | POA: Diagnosis not present

## 2015-08-02 DIAGNOSIS — E039 Hypothyroidism, unspecified: Secondary | ICD-10-CM | POA: Diagnosis not present

## 2015-08-02 DIAGNOSIS — I2510 Atherosclerotic heart disease of native coronary artery without angina pectoris: Secondary | ICD-10-CM | POA: Insufficient documentation

## 2015-08-02 DIAGNOSIS — Z79899 Other long term (current) drug therapy: Secondary | ICD-10-CM | POA: Diagnosis not present

## 2015-08-02 DIAGNOSIS — Z87891 Personal history of nicotine dependence: Secondary | ICD-10-CM | POA: Insufficient documentation

## 2015-08-02 DIAGNOSIS — Z7982 Long term (current) use of aspirin: Secondary | ICD-10-CM | POA: Insufficient documentation

## 2015-08-02 DIAGNOSIS — W19XXXA Unspecified fall, initial encounter: Secondary | ICD-10-CM

## 2015-08-02 DIAGNOSIS — T148 Other injury of unspecified body region: Secondary | ICD-10-CM | POA: Diagnosis not present

## 2015-08-02 DIAGNOSIS — Z8669 Personal history of other diseases of the nervous system and sense organs: Secondary | ICD-10-CM | POA: Diagnosis not present

## 2015-08-02 DIAGNOSIS — F0390 Unspecified dementia without behavioral disturbance: Secondary | ICD-10-CM | POA: Diagnosis not present

## 2015-08-02 DIAGNOSIS — S51811A Laceration without foreign body of right forearm, initial encounter: Secondary | ICD-10-CM | POA: Diagnosis not present

## 2015-08-02 DIAGNOSIS — S41109A Unspecified open wound of unspecified upper arm, initial encounter: Secondary | ICD-10-CM | POA: Diagnosis not present

## 2015-08-02 DIAGNOSIS — W010XXA Fall on same level from slipping, tripping and stumbling without subsequent striking against object, initial encounter: Secondary | ICD-10-CM | POA: Insufficient documentation

## 2015-08-02 NOTE — ED Provider Notes (Signed)
CSN: [id number]     Arrival date & time 08/02/15  1239 History   First MD Initiated Contact with Patient 08/02/15 1240     Chief Complaint  Patient presents with  . Fall  . Extremity Laceration     HPI Patient is brought to the emergency department after slipping and falling in her house today.  She denies head injury but is reported that she is on anticoagulants.  She denies neck pain.  Reports no weakness of her upper lower extremity was.  She reports full range of motion of her bilateral knees, ankles, hips.  She also reports full range of motion of her bilateral shoulders, elbows, wrists.  She states she has 2 skin tears on her right forearm which she needs repaired.  She reports no chest pain shortness of breath.  Denies abdominal pain.  Patient [facility].  She reports she slipped because of the lack of grip on her old shoes   Past Medical History  Diagnosis Date  . Hypertension   . Coronary artery disease     NSTEMI 3/10. LHC showed 80% mLAD, 90% mCFX. She had PCI with Endeavor 2.5 x 12 to LAD and Endeavor 3.0 x 12 to CFX  . CHF (congestive heart failure) (HCC)     Diastolic CHF. Echo (3/10) EF 45-50% with periapical akinesis. Mild LVH. Mild MR. Pseudonormal diastolic function. Echo (4/10): EF 65% with mild focal basal septal hypertrophy. Pseudonormal diastolic function. Normal wall motion. Moderate biatrial enlargement. PASP 61 mmHg. Echo (4/11): EF 55%, inferobasal hypokinesis, mild MR, PA systolic pressure 44 mmHg.  . Frequent falls   . Lacunar stroke (HCC)     on CT  . Expressive aphasia syndrome     More likely atypical migraine than TIA. Carotid dopplers (8/09) with 40-60% LICA stenosis.  . Horner's syndrome     left  . SVT (supraventricular tachycardia) (HCC)     transient  . Hypothyroidism   . Dementia    History reviewed. No pertinent past surgical history. Family History  Problem Relation Age of Onset  .  Hypertension Mother   . COPD Father    Social History  Substance Use Topics  . Smoking status: Former Smoker    Types: Cigarettes    Quit date: 05/25/1950  . Smokeless tobacco: Never Used  . Alcohol Use: No   OB History    No data available     Review of Systems  All other systems reviewed and are negative.     Allergies  Review of patient's allergies indicates no known allergies.  Home Medications   Prior to Admission medications   Medication Sig Start Date End Date Taking? Authorizing Provider  amLODipine (NORVASC) 10 MG tablet Take 1 tablet (10 mg total) by mouth daily. Patient is overdue for an appointment. Please call and schedule for further refills 07/29/15   Laurey Moralealton S McLean, MD  aspirin 81 MG tablet Take 162 mg by mouth daily.     Historical Provider, MD  atorvastatin (LIPITOR) 20 MG tablet TAKE 1 TABLET BY MOUTH EVERY DAY 09/16/14   Dyann KiefMichele M Lenze, PA-C  carvedilol (COREG) 12.5 MG tablet TAKE 1 TABLET BY MOUTH TWICE A DAY 07/22/15   Laurey Moralealton S McLean, MD  cloNIDine (CATAPRES) 0.1 MG tablet TAKE 1 TABLET BY MOUTH 3 TIMES A DAY 06/01/15   Shirline Freesory Nafziger, NP  donepezil (ARICEPT) 10 MG tablet Take 1 tablet (10 mg total) by mouth  at bedtime. 07/21/15   Levert Feinstein, MD  lactulose, encephalopathy, (CHRONULAC) 10 GM/15ML SOLN TAKE 15 MLS BY MOUTH DAILY FOR BOWELS. INCREASE TO 30 IF CONSTIPATED 03/07/15   Shirline Frees, NP  levETIRAcetam (KEPPRA) 250 MG tablet TAKE 1 TABLET TWICE A DAY 06/06/15   Levert Feinstein, MD  levothyroxine (SYNTHROID, LEVOTHROID) 25 MCG tablet TAKE 1 TABLET EVERY DAY 05/09/15   Shirline Frees, NP  nitroGLYCERIN (NITROSTAT) 0.4 MG SL tablet Place 0.4 mg under the tongue every 5 (five) minutes as needed for chest pain.     Historical Provider, MD  sertraline (ZOLOFT) 25 MG tablet TAKE 1 TABLET EVERY DAY 03/28/15   Shirline Frees, NP  valsartan (DIOVAN) 160 MG tablet TAKE 1 TABLET BY MOUTH TWICE A DAY 02/07/15   Laurey Morale, MD  valsartan (DIOVAN) 160 MG tablet Take 1 tablet  (160 mg total) by mouth 2 (two) times daily. Pt is overdue for an appointment. Please call to schedule for further refills 07/29/15   Laurey Morale, MD  Vitamin D, Ergocalciferol, (DRISDOL) 50000 units CAPS capsule TAKE ONE CAPSULE BY MOUTH WEEKLY 05/19/15   Shirline Frees, NP   BP 131/98 mmHg  Pulse 53  Temp(Src) 97.8 F (36.6 C)  Resp 26  SpO2 100% Physical Exam  Constitutional: She is oriented to person, place, and time. She appears well-developed and well-nourished. No distress.  HENT:  Head: Normocephalic and atraumatic.  Eyes: EOM are normal.  Neck: Normal range of motion. Neck supple.  No cervical spine tenderness.  Cardiovascular: Normal rate, regular rhythm and normal heart sounds.   Pulmonary/Chest: Effort normal and breath sounds normal.  Abdominal: Soft. She exhibits no distension. There is no tenderness.  Musculoskeletal: Normal range of motion.  Skin tears to right anterior and right posterior forearm without active bleeding.  Full range of motion bilateral shoulders, elbows, wrists.  Full range of motion of bilateral ankles, knees, hips  Neurological: She is alert and oriented to person, place, and time.  Skin: Skin is warm and dry.  Psychiatric: She has a normal mood and affect. Judgment normal.  Nursing note and vitals reviewed.   ED Course  .Marland KitchenLaceration Repair Performed by: Azalia Bilis Authorized by: Azalia Bilis  +++++++++++++++++++++++++++++++++++++++++++++++++++++++++++++++++++++  LACERATION REPAIR Performed by: Lyanne Co Consent: Verbal consent obtained. Risks and benefits: risks, benefits and alternatives were discussed Patient identity confirmed: provided demographic data Time out performed prior to procedure Prepped and Draped in normal sterile fashion Wound explored Laceration Location: Right anterior forearm Laceration Length: 2 cm No Foreign Bodies seen or palpated Amount of cleaning: standard Skin closure: Steri-Strips/Dermabond   Number of sutures or staples: Dermabond  Technique: Dermabond  Patient tolerance: Patient tolerated the procedure well with no immediate complications.  LACERATION REPAIR Performed by: Lyanne Co Consent: Verbal consent obtained. Risks and benefits: risks, benefits and alternatives were discussed Patient identity confirmed: provided demographic data Time out performed prior to procedure Prepped and Draped in normal sterile fashion Wound explored Laceration Location: Right posterior forearm Laceration Length: 1.5 cm No Foreign Bodies seen or palpated Anesthesia: None  Amount of cleaning: standard Skin closure: Tissue adhesive/Steri-Strip  Number of sutures or staples: Dermabond  Technique: Dermabond  Patient tolerance: Patient tolerated the procedure well with no immediate complications.   +++++++++++++++++++++++++++++++++++++++++++++++++++   Labs Review Labs Reviewed - No data to display  Imaging Review Ct Head Wo Contrast  08/02/2015  CLINICAL DATA:  Multiple falls recently, did not hit her head, no known injury, history CHF, coronary artery disease,  hypertension, expressive aphasia syndrome, dementia EXAM: CT HEAD WITHOUT CONTRAST TECHNIQUE: Contiguous axial images were obtained from the base of the skull through the vertex without intravenous contrast. COMPARISON:  06/25/2008 FINDINGS: Generalized atrophy. Normal ventricular morphology. No midline shift or mass effect. Small vessel chronic ischemic changes of deep cerebral white matter. No intracranial hemorrhage, mass lesion, or acute infarction. Chronic opacification of RIGHT maxillary sinus. Visualized paranasal sinuses and mastoid air cells otherwise clear. Bones unremarkable. IMPRESSION: Atrophy with small vessel chronic ischemic changes of deep cerebral white matter. No acute intracranial abnormalities. Electronically Signed   By: Ulyses Southward M.D.   On: 08/02/2015 14:53   I have personally reviewed and evaluated these  images and lab results as part of my medical decision-making.   EKG Interpretation None      MDM   Final diagnoses:  Fall, initial encounter  Minor head injury, initial encounter  Skin tear of right forearm without complication, initial encounter    CT head normal.  Lacerations repaired with comminution of Steri-Strip and Dermabond.  Infection warnings given.  Head injury warnings given.  Primary care follow-up.  All sounds mechanical.    Azalia Bilis, MD 08/02/15 1504

## 2015-08-02 NOTE — ED Notes (Signed)
Dr. Patria Maneampos informed of pt BP

## 2015-08-02 NOTE — ED Notes (Signed)
Per [facility], pt tripped and fell due to her shoes, denies hitting head, pt is on blood thinners. Pt denies LOC. Pt has multpile skin tears on R arm with adipose tissue exposed. Pt doe snot have any complaints at this time, AAOX4.

## 2015-08-02 NOTE — Discharge Instructions (Signed)
Sterile Tape Wound Care Some cuts and wounds can be closed using sterile tape, also called skin adhesive strips. Skin adhesive strips can be used for shallow (superficial) and simple cuts, wounds, lacerations, and surgical incisions. These strips act in place of stitches to hold the edges of the wound together, allowing for faster healing. Unlike stitches, the adhesive strips do not require needles or anesthetic medicine for placement. The strips will wear off naturally as the wound is healing. It is important to take proper care of your wound at home while it heals.  HOME CARE INSTRUCTIONS  Try to keep the area around your wound clean and dry. Do not allow the adhesive strips to get wet for the first 12 hours.   Do not use any soaps or ointments on the wound for the first 12 hours.   If a bandage (dressing) has been applied, follow your health care provider's instructions for how often to change the dressing. Keep the dressing dry if one has been applied.   Do not remove the adhesive strips. They will fall off on their own. If they do not, you may remove them gently after 10 days. You should gently wet the strips before removing them. For example, this can be done in the shower.  Do not scratch, pick, or rub the wound area.   Protect the wound from further injury until it is healed.   Protect the wound from sun and tanning bed exposure while it is healing and for several weeks after healing.   Only take over-the-counter or prescription medicines as directed by your health care provider.   Keep all follow-up appointments as directed by your health care provider.  SEEK MEDICAL CARE IF: Your adhesive strips become wet or soaked with blood before the wound has healed. The tape will need to be replaced.  SEEK IMMEDIATE MEDICAL CARE IF:  You have increasing pain in the wound.   You develop a rash after the strips are applied.  Your wound becomes red, swollen, hot, or tender.   You  have a red streak that goes away from the wound.   You have pus coming from the wound.   You have increased bleeding from the wound.  You notice a bad smell coming from the wound.   Your wound breaks open. MAKE SURE YOU:  Understand these instructions.  Will watch your condition.  Will get help right away if you are not doing well or get worse.   This information is not intended to replace advice given to you by your health care provider. Make sure you discuss any questions you have with your health care provider.   Document Released: 03/22/2004 Document Revised: 03/05/2014 Document Reviewed: 09/03/2012 Elsevier Interactive Patient Education 2016 [employer] Tissue Adhesive Wound Care Some cuts, wounds, lacerations, and incisions can be repaired by using tissue adhesive. Tissue adhesive is like glue. It holds the skin together, allowing for faster healing. It forms a strong bond on the skin in about 1 minute and reaches its full strength in about 2 or 3 minutes. The adhesive disappears naturally while the wound is healing. It is important to take proper care of your wound at home while it heals.  HOME CARE INSTRUCTIONS   Showers are allowed. Do not soak the area containing the tissue adhesive. Do not take baths, swim, or use hot tubs. Do not use any soaps or ointments on the wound. Certain ointments can weaken the glue.  If a bandage (dressing) has  been applied, follow your health care provider's instructions for how often to change the dressing.   Keep the dressing dry if one has been applied.   Do not scratch, pick, or rub the adhesive.   Do not place tape over the adhesive. The adhesive could come off when pulling the tape off.   Protect the wound from further injury until it is healed.   Protect the wound from sun and tanning bed exposure while it is healing and for several weeks after healing.   Only take over-the-counter or prescription medicines as directed  by your health care provider.   Keep all follow-up appointments as directed by your health care provider. SEEK IMMEDIATE MEDICAL CARE IF:   Your wound becomes red, swollen, hot, or tender.   You develop a rash after the glue is applied.  You have increasing pain in the wound.   You have a red streak that goes away from the wound.   You have pus coming from the wound.   You have increased bleeding.  You have a fever.  You have shaking chills.   You notice a bad smell coming from the wound.   Your wound or adhesive breaks open.  MAKE SURE YOU:   Understand these instructions.  Will watch your condition.  Will get help right away if you are not doing well or get worse.   This information is not intended to replace advice given to you by your health care provider. Make sure you discuss any questions you have with your health care provider.   Document Released: 08/08/2000 Document Revised: 12/03/2012 Document Reviewed: 09/03/2012 Elsevier Interactive Patient Education [employer] Inc[employer]

## 2015-08-02 NOTE — ED Notes (Signed)
Pt transported to CT ?

## 2015-08-03 ENCOUNTER — Other Ambulatory Visit: Payer: Self-pay | Admitting: *Deleted

## 2015-08-03 MED ORDER — LEVETIRACETAM 250 MG PO TABS: 250.0000 mg | ORAL_TABLET | Freq: Two times a day (BID) | ORAL | Status: DC

## 2015-08-04 ENCOUNTER — Ambulatory Visit: Payer: Medicare Other | Admitting: Neurology

## 2015-08-08 ENCOUNTER — Encounter: Payer: Self-pay | Admitting: Adult Health

## 2015-08-08 ENCOUNTER — Ambulatory Visit (INDEPENDENT_AMBULATORY_CARE_PROVIDER_SITE_OTHER): Payer: Medicare Other | Admitting: Adult Health

## 2015-08-08 VITALS — BP 140/60 | Temp 97.6°F | Wt 109.0 lb

## 2015-08-08 DIAGNOSIS — W19XXXD Unspecified fall, subsequent encounter: Secondary | ICD-10-CM | POA: Diagnosis not present

## 2015-08-08 DIAGNOSIS — S5011XD Contusion of right forearm, subsequent encounter: Secondary | ICD-10-CM

## 2015-08-08 DIAGNOSIS — W19XXXA Unspecified fall, initial encounter: Secondary | ICD-10-CM

## 2015-08-08 NOTE — Patient Instructions (Signed)
It was great seeing you today.   I want you to decrease Norvasc from 10 mg to 5 mg.   I want you to also drink an extra Boost per day.   Follow up for continued falls.

## 2015-08-08 NOTE — Progress Notes (Signed)
Subjective:    Patient ID: [name], female    DOB: 03/25/1920, 82 y.o.   MRN: [id number]  HPI  82 year old patient who presents to the office today after being seen in the ER 6 days ago after a slip and fall at home. She is on anticoagulants. Per ER note:  Patient is brought to the emergency department after slipping and falling in her house today. She denies head injury but is reported that she is on anticoagulants. She denies neck pain. Reports no weakness of her upper lower extremity was. She reports full range of motion of her bilateral knees, ankles, hips. She also reports full range of motion of her bilateral shoulders, elbows, wrists. She states she has 2 skin tears on her right forearm which she needs repaired. She reports no chest pain shortness of breath. Denies abdominal pain. Patient reports [facility]. She reports she slipped because of the lack of grip on her old shoes  CT of head was negative for acute abnormality.   Lacerations repaired with comminution of Steri-Strip and Dermabond   Today in the office she reports " I feel fine". She does not remember the fall in it's entirely. She reports " my shoes got stuck in the carpet and I fell." Her granddaughter reports that over the last few months she has been standing for long periods of time, prior to falling.   She denies any dizziness when standing for long periods of time. She denies any dizziness with position changes. No wounds to the head  She has closed skin tears on her right forearm. She denies any signs of infection.   She does not have any area rugs in the house.      Review of Systems  Constitutional: Negative.   Respiratory: Negative.   Cardiovascular: Negative.   Skin: Positive for color change and wound.  Neurological: Negative.   All other systems reviewed and are negative.  Past Medical History  Diagnosis Date  . Hypertension   . Coronary  artery disease     NSTEMI 3/10. LHC showed 80% mLAD, 90% mCFX. She had PCI with Endeavor 2.5 x 12 to LAD and Endeavor 3.0 x 12 to CFX  . CHF (congestive heart failure) (HCC)     Diastolic CHF. Echo (3/10) EF 45-50% with periapical akinesis. Mild LVH. Mild MR. Pseudonormal diastolic function. Echo (4/10): EF 65% with mild focal basal septal hypertrophy. Pseudonormal diastolic function. Normal wall motion. Moderate biatrial enlargement. PASP 61 mmHg. Echo (4/11): EF 55%, inferobasal hypokinesis, mild MR, PA systolic pressure 44 mmHg.  . Frequent falls   . Lacunar stroke (HCC)     on CT  . Expressive aphasia syndrome     More likely atypical migraine than TIA. Carotid dopplers (8/09) with 40-60% LICA stenosis.  . Horner's syndrome     left  . SVT (supraventricular tachycardia) (HCC)     transient  . Hypothyroidism   . Dementia     Social History   Social History  . Marital Status: Widowed    Spouse Name: N/A  . Number of Children: N/A  . Years of Education: N/A   Occupational History  . Not on file.   Social History Main Topics  . Smoking status: Former Smoker    Types: Cigarettes    Quit date: 05/25/1950  . Smokeless tobacco: Never Used  . Alcohol Use: No  . Drug Use: No  .  Sexual Activity: Yes   Other Topics Concern  . Not on file   Social History Narrative    No past surgical history on file.  Family History  Problem Relation Age of Onset  . Hypertension Mother   . COPD Father     No Known Allergies  Current Outpatient Prescriptions on File Prior to Visit  Medication Sig Dispense Refill  . amLODipine (NORVASC) 10 MG tablet Take 1 tablet (10 mg total) by mouth daily. Patient is overdue for an appointment. Please call and schedule for further refills (Patient taking differently: Take 5 mg by mouth daily. Patient is overdue for an appointment. Please call and schedule for further refills) 90 tablet 0  . aspirin 81 MG tablet Take 162 mg by mouth daily.     [name]  atorvastatin (LIPITOR) 20 MG tablet TAKE 1 TABLET BY MOUTH EVERY DAY 30 tablet 11  . carvedilol (COREG) 12.5 MG tablet TAKE 1 TABLET BY MOUTH TWICE A DAY 60 tablet 0  . cloNIDine (CATAPRES) 0.1 MG tablet TAKE 1 TABLET BY MOUTH 3 TIMES A DAY 270 tablet 1  . donepezil (ARICEPT) 10 MG tablet Take 1 tablet (10 mg total) by mouth at bedtime. 30 tablet 11  . lactulose, encephalopathy, (CHRONULAC) 10 GM/15ML SOLN TAKE 15 MLS BY MOUTH DAILY FOR BOWELS. INCREASE TO 30 IF CONSTIPATED 240 mL 1  . levETIRAcetam (KEPPRA) 250 MG tablet Take 1 tablet (250 mg total) by mouth 2 (two) times daily. 180 tablet 3  . levothyroxine (SYNTHROID, LEVOTHROID) 25 MCG tablet TAKE 1 TABLET EVERY DAY 90 tablet 2  . nitroGLYCERIN (NITROSTAT) 0.4 MG SL tablet Place 0.4 mg under the tongue every 5 (five) minutes as needed for chest pain.     [name] sertraline (ZOLOFT) 25 MG tablet TAKE 1 TABLET EVERY DAY 30 tablet 5  . valsartan (DIOVAN) 160 MG tablet TAKE 1 TABLET BY MOUTH TWICE A DAY 180 tablet 0  . valsartan (DIOVAN) 160 MG tablet Take 1 tablet (160 mg total) by mouth 2 (two) times daily. Pt is overdue for an appointment. Please call to schedule for further refills 180 tablet 0  . Vitamin D, Ergocalciferol, (DRISDOL) 50000 units CAPS capsule TAKE ONE CAPSULE BY MOUTH WEEKLY 30 capsule 0   No current facility-administered medications on file prior to visit.    BP 140/60 mmHg  Temp(Src) 97.6 F (36.4 C) (Oral)  Wt 109 lb (49.442 kg)        Objective:   Physical Exam  Constitutional: She is oriented to person, place, and time. She appears well-developed and well-nourished. No distress.  HENT:  Head: Normocephalic and atraumatic.  Right Ear: External ear normal.  Left Ear: External ear normal.  Nose: Nose normal.  Mouth/Throat: Oropharynx is clear and moist. No oropharyngeal exudate.  Eyes: Conjunctivae and EOM are normal. Pupils are equal, round, and reactive to light. Right eye exhibits no discharge. Left eye exhibits  no discharge. No scleral icterus.  Cardiovascular: Normal rate, regular rhythm, normal heart sounds and intact distal pulses.  Exam reveals no gallop and no friction rub.   No murmur heard. Pulmonary/Chest: Effort normal and breath sounds normal. No respiratory distress. She has no wheezes. She has no rales. She exhibits no tenderness.  Neurological: She is alert and oriented to person, place, and time.  Skin: Skin is warm and dry. No rash noted. She is not diaphoretic. No erythema. No pallor.  Closed skin tears on right arm. Wounds appear well healing. Bruising with multiple stages  of healing.   Psychiatric: She has a normal mood and affect. Her behavior is normal. Judgment and thought content normal.  Nursing note and vitals reviewed.     Assessment & Plan:  1. Fall, initial encounter - I am going to decrease her amlodipine from 10 mg to 5 mg.  - Increase fluid intake - Follow up if falls become more frequent - Advised walker but patient refuses to use it  Shirline Freesory Ceylon Arenson, NP

## 2015-08-10 ENCOUNTER — Ambulatory Visit: Payer: Medicare Other | Admitting: Neurology

## 2015-08-24 ENCOUNTER — Other Ambulatory Visit: Payer: Self-pay | Admitting: Cardiology

## 2015-09-01 ENCOUNTER — Encounter: Payer: Self-pay | Admitting: Cardiology

## 2015-09-01 ENCOUNTER — Ambulatory Visit (INDEPENDENT_AMBULATORY_CARE_PROVIDER_SITE_OTHER): Payer: Medicare Other | Admitting: Cardiology

## 2015-09-01 VITALS — BP 98/58 | HR 60 | Ht 59.0 in | Wt 114.1 lb

## 2015-09-01 DIAGNOSIS — N289 Disorder of kidney and ureter, unspecified: Secondary | ICD-10-CM

## 2015-09-01 DIAGNOSIS — I2510 Atherosclerotic heart disease of native coronary artery without angina pectoris: Secondary | ICD-10-CM

## 2015-09-01 DIAGNOSIS — I6523 Occlusion and stenosis of bilateral carotid arteries: Secondary | ICD-10-CM | POA: Diagnosis not present

## 2015-09-01 DIAGNOSIS — E785 Hyperlipidemia, unspecified: Secondary | ICD-10-CM

## 2015-09-01 DIAGNOSIS — I959 Hypotension, unspecified: Secondary | ICD-10-CM

## 2015-09-01 DIAGNOSIS — I10 Essential (primary) hypertension: Secondary | ICD-10-CM | POA: Diagnosis not present

## 2015-09-01 DIAGNOSIS — I5032 Chronic diastolic (congestive) heart failure: Secondary | ICD-10-CM

## 2015-09-01 LAB — BASIC METABOLIC PANEL
BUN: 41 mg/dL — ABNORMAL HIGH (ref 7–25)
CALCIUM: 8.9 mg/dL (ref 8.6–10.4)
CO2: 28 mmol/L (ref 20–31)
CREATININE: 1.46 mg/dL — AB (ref 0.60–0.88)
Chloride: 104 mmol/L (ref 98–110)
Glucose, Bld: 92 mg/dL (ref 65–99)
Potassium: 4.7 mmol/L (ref 3.5–5.3)
SODIUM: 141 mmol/L (ref 135–146)

## 2015-09-01 MED ORDER — AMLODIPINE BESYLATE 5 MG PO TABS: 5.0000 mg | ORAL_TABLET | Freq: Every day | ORAL | Status: DC

## 2015-09-01 NOTE — Progress Notes (Signed)
Cardiology Office Note   Date:  09/02/2015   ID:  [name], DOB 05/29/1920, MRN [id number]  PCP:  Shirline Freesory Nafziger, NP  Cardiologist:  Dr. Shirlee LatchMcLean    Chief Complaint  Patient presents with  . Coronary Artery Disease      History of Present Illness: [name] is a 82 y.o. female who presents for CAD.    She has a history of CAD status post non-STEMI in 2010 treated with DES to the circumflex and LAD, diastolic heart failure, TIAs versus atypical migraines, carotid stenosis.  Patient continues to live independently but is surrounded by family. Someone stays with her most of the time. She is accompanied today by her son.. She had no dizziness or syncope. She denies chest pain, palpitations, dyspnea, dyspnea on exertion, dizziness or presyncope. Her main complaint is worsening hearing and her memory isn't as good as it used to be.   Her BP is low today and PCP had recommended decreasing her amlodipine to 5 mg daily.  They have not yet done this, they need smaller pill.  Her son is retiring soon and will be staying with her during the day.    Past Medical History  Diagnosis Date  . Hypertension   . Coronary artery disease     NSTEMI 3/10. LHC showed 80% mLAD, 90% mCFX. She had PCI with Endeavor 2.5 x 12 to LAD and Endeavor 3.0 x 12 to CFX  . CHF (congestive heart failure) (HCC)     Diastolic CHF. Echo (3/10) EF 45-50% with periapical akinesis. Mild LVH. Mild MR. Pseudonormal diastolic function. Echo (4/10): EF 65% with mild focal basal septal hypertrophy. Pseudonormal diastolic function. Normal wall motion. Moderate biatrial enlargement. PASP 61 mmHg. Echo (4/11): EF 55%, inferobasal hypokinesis, mild MR, PA systolic pressure 44 mmHg.  . Frequent falls   . Lacunar stroke (HCC)     on CT  . Expressive aphasia syndrome     More likely atypical migraine than TIA. Carotid dopplers (8/09) with 40-60% LICA stenosis.  . Horner's syndrome     left  . SVT (supraventricular tachycardia)  (HCC)     transient  . Hypothyroidism   . Dementia     History reviewed. No pertinent past surgical history.   Current Outpatient Prescriptions  Medication Sig Dispense Refill  . amLODipine (NORVASC) 5 MG tablet Take 1 tablet (5 mg total) by mouth daily. 30 tablet 9  . aspirin 81 MG tablet Take 162 mg by mouth daily.     [name]. atorvastatin (LIPITOR) 20 MG tablet TAKE 1 TABLET BY MOUTH EVERY DAY 30 tablet 11  . carvedilol (COREG) 12.5 MG tablet TAKE 1 TABLET BY MOUTH TWICE A DAY 60 tablet 0  . cloNIDine (CATAPRES) 0.1 MG tablet TAKE 1 TABLET BY MOUTH 3 TIMES A DAY 270 tablet 1  . donepezil (ARICEPT) 10 MG tablet Take 1 tablet (10 mg total) by mouth at bedtime. 30 tablet 11  . lactulose, encephalopathy, (CHRONULAC) 10 GM/15ML SOLN TAKE 15 MLS BY MOUTH DAILY FOR BOWELS. INCREASE TO 30 IF CONSTIPATED 240 mL 1  . levETIRAcetam (KEPPRA) 250 MG tablet Take 1 tablet (250 mg total) by mouth 2 (two) times daily. 180 tablet 3  . levothyroxine (SYNTHROID, LEVOTHROID) 25 MCG tablet Take 25 mcg by mouth daily before breakfast.    . nitroGLYCERIN (NITROSTAT) 0.4 MG SL tablet Place 0.4 mg under the tongue every 5 (five) minutes as needed for chest pain (3 DOSES MAX).    .[name]  sertraline (ZOLOFT) 25 MG tablet Take 25 mg by mouth daily.    . valsartan (DIOVAN) 160 MG tablet TAKE 1 TABLET BY MOUTH TWICE A DAY 180 tablet 0  . Vitamin D, Ergocalciferol, (DRISDOL) 50000 units CAPS capsule TAKE ONE CAPSULE BY MOUTH WEEKLY 30 capsule 0   No current facility-administered medications for this visit.    Allergies:   Review of patient's allergies indicates no known allergies.    Social History:  The patient  reports that she quit smoking about 65 years ago. Her smoking use included Cigarettes. She has never used smokeless tobacco. She reports that she does not drink alcohol or use illicit drugs.   Family History:  The patient's family history includes COPD in her father; Hypertension in her mother. There is no history  of Heart attack.    ROS:  General:no colds or fevers, no weight changes Skin:no rashes or ulcers HEENT:no blurred vision, no congestion, hard of hearing CV:see HPI PUL:see HPI GI:no diarrhea constipation or melena, no indigestion GU:no hematuria, no dysuria MS:no joint pain, no claudication Neuro:no syncope, no lightheadedness Endo:no diabetes, + thyroid disease  Wt Readings from Last 3 Encounters:  09/01/15 114 lb 1.9 oz (51.764 kg)  08/08/15 109 lb (49.442 kg)  04/19/15 115 lb 1.6 oz (52.209 kg)     PHYSICAL EXAM: VS:  BP 98/58 mmHg  Pulse 60  Ht  (1.499 m)  Wt 114 lb 1.9 oz (51.764 kg)  BMI 23.04 kg/m2 , BMI Body mass index is 23.04 kg/(m^2). General:Pleasant affect, NAD Skin:Warm and dry, brisk capillary refill HEENT:normocephalic, sclera clear, mucus membranes moist Neck:supple, no JVD, no bruits  Heart:S1S2 RRR with 2/6 systolic murmur, no gallup, rub or click Lungs:clear without rales, rhonchi, or wheezes ZOX:WRUE, non tender, + BS, do not palpate liver spleen or masses Ext:no lower ext edema, 2+ pedal pulses, 2+ radial pulses Neuro:alert and oriented, MAE, follows commands, + facial symmetry    EKG:  EKG is NOT ordered today. Reviewed EKG from 12.19.16 no acute changes. SR  Recent Labs: 11/12/2014: ALT 16 02/14/2015: TSH 2.732 03/22/2015: Hemoglobin 11.5*; Platelets 264.0 09/01/2015: BUN 41*; Creat 1.46*; Potassium 4.7; Sodium 141    Lipid Panel    Component Value Date/Time   CHOL 113 08/21/2013 1014   TRIG 125.0 08/21/2013 1014   TRIG 189* 03/05/2006 0926   HDL 65.00 08/21/2013 1014   CHOLHDL 2 08/21/2013 1014   CHOLHDL 3.3 CALC 03/05/2006 0926   VLDL 25.0 08/21/2013 1014   LDLCALC 23 08/21/2013 1014   LDLDIRECT 36.0 03/22/2009 1604       Other studies Reviewed: Additional studies/ records that were reviewed today include:   Carotid dopplers: 40-59%RICA 1-39%LICA stable. F/u in 1 yr     ECHO 05/2009 Study Conclusions  - Left  ventricle: Inferobasal hypokinesis Basal septal hypertrophy  The cavity size was mildly dilated. - Mitral valve: Mild regurgitation. - Left atrium: The atrium was mildly dilated. - Atrial septum: No defect or patent foramen ovale was identified. - Pulmonary arteries: PA peak pressure: 44mm Hg (S).  ASSESSMENT AND PLAN:  CORONARY ATHEROSCLEROSIS NATIVE CORONARY ARTERY Stable without chest pain   Essential hypertension Blood pressure is low agree with decreasing amlodipine to 5 mg daily.  Will check BMP as well.[name]   DIASTOLIC HEART FAILURE, CHRONIC No evidence of heart failure.   Carotid stenosis Patient has history of carotid stenosis.last dopplers recommended repeat in one year- Order carotid Dopplers. But then hope to go to every 2 years.  HLD Recheck CMP and Lipids in few weeks.    CKD  Will recheck if BP continues to be low would then decrease ARB.  Follow up in 6 months or sooner if needed.   Current medicines are reviewed with the patient today.  The patient Has no concerns regarding medicines.  The following changes have been made:  See above Labs/ tests ordered today include:see above  Disposition:   FU:  see above  Signed, Nada BoozerLaura Ingold, NP  09/02/2015 12:34 PM    [facility] [address], [place], [place]  [address]/ [address] [place], [place] Phone: [phone]; Fax: [phone]  [phone]

## 2015-09-01 NOTE — Patient Instructions (Signed)
Medication Instructions:  Your physician has recommended you make the following change in your medication:  1. Decrease Amlodipine ( 5 mg ) daily   Labwork: Your physician recommends that you have lab work today: bmet   Testing/Procedures: Your physician has requested that you have a carotid duplex. This test is an ultrasound of the carotid arteries in your neck. It looks at blood flow through these arteries that supply the brain with blood. Allow one hour for this exam. There are no restrictions or special instructions.    Follow-Up: Your physician wants you to follow-up in: 6 months with Dr. Shirlee LatchMclean.  You will receive a reminder letter in the mail two months in advance. If you don't receive a letter, please call our office to schedule the follow-up appointment.   Any Other Special Instructions Will Be Listed Below (If Applicable).     If you need a refill on your cardiac medications before your next appointment, please call your pharmacy.

## 2015-09-02 ENCOUNTER — Telehealth: Payer: Self-pay | Admitting: *Deleted

## 2015-09-02 DIAGNOSIS — I5041 Acute combined systolic (congestive) and diastolic (congestive) heart failure: Secondary | ICD-10-CM

## 2015-09-02 DIAGNOSIS — I10 Essential (primary) hypertension: Secondary | ICD-10-CM

## 2015-09-02 NOTE — Telephone Encounter (Signed)
-----   Message from Leone BrandLaura R Ingold, NP sent at 09/02/2015  1:00 PM EDT ----- Could we get fasting lipid and CMP in 2 weeks thanks could we get BP then as well?

## 2015-09-02 NOTE — Telephone Encounter (Signed)
Follow Up:; ° ° °Returning your call. °

## 2015-09-02 NOTE — Telephone Encounter (Signed)
Pt son, [name], states that he forgot to tell you yesterday that her pcp had them curt her bp Clonidine from 3 pills a day to 2 pills a day and the reduction of the Amlodipine.  He wanted me to let you know.  Orders for repeat lab work in epic.CMP & Fasting LIpid

## 2015-09-04 NOTE — Telephone Encounter (Signed)
Ok can you change the med list?  thanks

## 2015-09-06 NOTE — Addendum Note (Signed)
Addended by: Burnetta SabinWITTY, Hadiyah Maricle K on: 09/06/2015 07:28 AM   Modules accepted: Orders, Medications

## 2015-09-12 ENCOUNTER — Telehealth: Payer: Self-pay | Admitting: Cardiology

## 2015-09-12 ENCOUNTER — Ambulatory Visit (INDEPENDENT_AMBULATORY_CARE_PROVIDER_SITE_OTHER): Payer: Medicare Other | Admitting: *Deleted

## 2015-09-12 ENCOUNTER — Emergency Department (HOSPITAL_COMMUNITY)
Admission: EM | Admit: 2015-09-12 | Discharge: 2015-09-12 | Disposition: A | Discharge status: Home / Self Care | Payer: Medicare Other | Attending: Emergency Medicine | Admitting: Emergency Medicine

## 2015-09-12 ENCOUNTER — Other Ambulatory Visit (INDEPENDENT_AMBULATORY_CARE_PROVIDER_SITE_OTHER): Payer: Medicare Other | Admitting: *Deleted

## 2015-09-12 ENCOUNTER — Encounter (HOSPITAL_COMMUNITY): Payer: Self-pay | Admitting: Emergency Medicine

## 2015-09-12 VITALS — BP 102/58 | HR 58 | Wt 109.0 lb

## 2015-09-12 DIAGNOSIS — I10 Essential (primary) hypertension: Secondary | ICD-10-CM

## 2015-09-12 DIAGNOSIS — Z87891 Personal history of nicotine dependence: Secondary | ICD-10-CM | POA: Insufficient documentation

## 2015-09-12 DIAGNOSIS — Z8673 Personal history of transient ischemic attack (TIA), and cerebral infarction without residual deficits: Secondary | ICD-10-CM | POA: Insufficient documentation

## 2015-09-12 DIAGNOSIS — R55 Syncope and collapse: Secondary | ICD-10-CM | POA: Diagnosis not present

## 2015-09-12 DIAGNOSIS — I5041 Acute combined systolic (congestive) and diastolic (congestive) heart failure: Secondary | ICD-10-CM

## 2015-09-12 DIAGNOSIS — I509 Heart failure, unspecified: Secondary | ICD-10-CM | POA: Insufficient documentation

## 2015-09-12 DIAGNOSIS — I2510 Atherosclerotic heart disease of native coronary artery without angina pectoris: Secondary | ICD-10-CM | POA: Insufficient documentation

## 2015-09-12 DIAGNOSIS — Z7982 Long term (current) use of aspirin: Secondary | ICD-10-CM | POA: Insufficient documentation

## 2015-09-12 DIAGNOSIS — I110 Hypertensive heart disease with heart failure: Secondary | ICD-10-CM | POA: Diagnosis not present

## 2015-09-12 LAB — LIPID PANEL
CHOLESTEROL: 109 mg/dL — AB (ref 125–200)
HDL: 83 mg/dL (ref >=46)
LDL CALC: 13 mg/dL (ref <130)
TRIGLYCERIDES: 67 mg/dL (ref <150)
Total CHOL/HDL Ratio: 1.3 Ratio (ref <=5.0)
VLDL: 13 mg/dL (ref <30)

## 2015-09-12 LAB — BASIC METABOLIC PANEL
Anion gap: 8 (ref 5–15)
BUN: 35 mg/dL — AB (ref 6–20)
CHLORIDE: 104 mmol/L (ref 101–111)
CO2: 25 mmol/L (ref 22–32)
CREATININE: 1.5 mg/dL — AB (ref 0.44–1.00)
Calcium: 9.1 mg/dL (ref 8.9–10.3)
GFR calc non Af Amer: 29 mL/min — ABNORMAL LOW (ref >60)
GFR, EST AFRICAN AMERICAN: 33 mL/min — AB (ref >60)
GLUCOSE: 109 mg/dL — AB (ref 65–99)
Potassium: 3.7 mmol/L (ref 3.5–5.1)
Sodium: 137 mmol/L (ref 135–145)

## 2015-09-12 LAB — CBC
HCT: 34.4 % — ABNORMAL LOW (ref 36.0–46.0)
Hemoglobin: 11.6 g/dL — ABNORMAL LOW (ref 12.0–15.0)
MCH: 30.6 pg (ref 26.0–34.0)
MCHC: 33.7 g/dL (ref 30.0–36.0)
MCV: 90.8 fL (ref 78.0–100.0)
PLATELETS: 224 10*3/uL (ref 150–400)
RBC: 3.79 MIL/uL — AB (ref 3.87–5.11)
RDW: 14.1 % (ref 11.5–15.5)
WBC: 9.6 10*3/uL (ref 4.0–10.5)

## 2015-09-12 LAB — COMPREHENSIVE METABOLIC PANEL
ALK PHOS: 63 U/L (ref 33–130)
ALT: 13 U/L (ref 6–29)
AST: 17 U/L (ref 10–35)
Albumin: 3.6 g/dL (ref 3.6–5.1)
BILIRUBIN TOTAL: 0.5 mg/dL (ref 0.2–1.2)
BUN: 38 mg/dL — AB (ref 7–25)
CO2: 27 mmol/L (ref 20–31)
Calcium: 8.8 mg/dL (ref 8.6–10.4)
Chloride: 101 mmol/L (ref 98–110)
Creat: 1.55 mg/dL — ABNORMAL HIGH (ref 0.60–0.88)
GLUCOSE: 160 mg/dL — AB (ref 65–99)
POTASSIUM: 3.9 mmol/L (ref 3.5–5.3)
Sodium: 139 mmol/L (ref 135–146)
TOTAL PROTEIN: 6.5 g/dL (ref 6.1–8.1)

## 2015-09-12 LAB — CBG MONITORING, ED: Glucose-Capillary: 111 mg/dL — ABNORMAL HIGH (ref 65–99)

## 2015-09-12 NOTE — Telephone Encounter (Signed)
Note Not Needed °

## 2015-09-12 NOTE — Telephone Encounter (Signed)
New message  Pt son wants to know can you get a hold of Dr. Shirlee LatchMcLean so that his mother can see the dr. She has been sitting for 4 hours in the lobby. Please call.

## 2015-09-12 NOTE — Telephone Encounter (Signed)
Pt has been seen and assessed by RN in ED, see notes.

## 2015-09-12 NOTE — ED Provider Notes (Signed)
CSN: [id number]     Arrival date & time 09/12/15  1225 History   First MD Initiated Contact with Patient 09/12/15 1616     Chief Complaint  Patient presents with  . Loss of Consciousness     (Consider location/radiation/quality/duration/timing/severity/associated sxs/prior Treatment) HPI Comments: Patient is a 82 year old female with past medical history of dementia, hypertension, coronary artery disease, and congestive heart failure. She presents for evaluation of a syncopal episode. She is apparently had several episodes of this over the past 6 months. While at the doctor's office this morning, she had an episode where she lost consciousness for several seconds. She then woke up and was alert and oriented. She denies any palpitations. She denies any chest pain.  This episode was witnessed by the granddaughter who is present at bedside. She reports the patient slumped over in the chair for several seconds and denies any seizure activity or convulsions. There is no bowel or bladder incontinence.  She has been evaluated in the ER on several occasions with similar episodes. She tells me that she currently feels fine and does not want to be here. She wants to go home.  Patient is a 82 y.o. female presenting with syncope. The history is provided by the patient.  Loss of Consciousness Episode history:  Multiple Most recent episode:  Today Duration:  5 seconds Timing:  Constant Progression:  Resolved Worsened by:  Nothing tried Ineffective treatments:  None tried Associated symptoms: no chest pain, no dizziness and no palpitations     Past Medical History  Diagnosis Date  . Hypertension   . Coronary artery disease     NSTEMI 3/10. LHC showed 80% mLAD, 90% mCFX. She had PCI with Endeavor 2.5 x 12 to LAD and Endeavor 3.0 x 12 to CFX  . CHF (congestive heart failure) (HCC)     Diastolic CHF. Echo (3/10) EF 45-50% with periapical akinesis. Mild LVH. Mild MR. Pseudonormal diastolic function.  Echo (4/10): EF 65% with mild focal basal septal hypertrophy. Pseudonormal diastolic function. Normal wall motion. Moderate biatrial enlargement. PASP 61 mmHg. Echo (4/11): EF 55%, inferobasal hypokinesis, mild MR, PA systolic pressure 44 mmHg.  . Frequent falls   . Lacunar stroke (HCC)     on CT  . Expressive aphasia syndrome     More likely atypical migraine than TIA. Carotid dopplers (8/09) with 40-60% LICA stenosis.  . Horner's syndrome     left  . SVT (supraventricular tachycardia) (HCC)     transient  . Hypothyroidism   . Dementia    History reviewed. No pertinent past surgical history. Family History  Problem Relation Age of Onset  . Hypertension Mother   . COPD Father   . Heart attack Neg Hx    Social History  Substance Use Topics  . Smoking status: Former Smoker    Types: Cigarettes    Quit date: 05/25/1950  . Smokeless tobacco: Never Used  . Alcohol Use: No   OB History    No data available     Review of Systems  Cardiovascular: Positive for syncope. Negative for chest pain and palpitations.  Neurological: Negative for dizziness.  All other systems reviewed and are negative.     Allergies  Review of patient's allergies indicates no known allergies.  Home Medications   Prior to Admission medications   Medication Sig Start Date End Date Taking? Authorizing Provider  amLODipine (NORVASC) 5 MG tablet Take 1 tablet (5 mg total) by mouth daily. 09/01/15   Darcella Gasman  Annie ParasIngold, NP  aspirin 81 MG tablet Take 162 mg by mouth daily.     Historical Provider, MD  atorvastatin (LIPITOR) 20 MG tablet TAKE 1 TABLET BY MOUTH EVERY DAY 09/16/14   Dyann KiefMichele M Lenze, PA-C  carvedilol (COREG) 12.5 MG tablet TAKE 1 TABLET BY MOUTH TWICE A DAY 08/25/15   Laurey Moralealton S McLean, MD  cloNIDine (CATAPRES) 0.1 MG tablet Take 0.1 mg by mouth 2 (two) times daily.    Historical Provider, MD  donepezil (ARICEPT) 10 MG tablet Take 1 tablet (10 mg total) by mouth at bedtime. 07/21/15   Levert FeinsteinYijun Yan, MD   lactulose, encephalopathy, (CHRONULAC) 10 GM/15ML SOLN TAKE 15 MLS BY MOUTH DAILY FOR BOWELS. INCREASE TO 30 IF CONSTIPATED Patient not taking: Reported on 09/12/2015 03/07/15   Shirline Freesory Nafziger, NP  levETIRAcetam (KEPPRA) 250 MG tablet Take 1 tablet (250 mg total) by mouth 2 (two) times daily. 08/03/15   Levert FeinsteinYijun Yan, MD  levothyroxine (SYNTHROID, LEVOTHROID) 25 MCG tablet Take 25 mcg by mouth daily before breakfast.    Historical Provider, MD  nitroGLYCERIN (NITROSTAT) 0.4 MG SL tablet Place 0.4 mg under the tongue every 5 (five) minutes as needed for chest pain (3 DOSES MAX).    Historical Provider, MD  sertraline (ZOLOFT) 25 MG tablet Take 25 mg by mouth daily.    Historical Provider, MD  valsartan (DIOVAN) 160 MG tablet TAKE 1 TABLET BY MOUTH TWICE A DAY 02/07/15   Laurey Moralealton S McLean, MD  Vitamin D, Ergocalciferol, (DRISDOL) 50000 units CAPS capsule TAKE ONE CAPSULE BY MOUTH WEEKLY 05/19/15   Shirline Freesory Nafziger, NP   BP 165/59 mmHg  Pulse 62  Temp(Src) 97.8 F (36.6 C) (Oral)  Resp 17  Ht 4\' 11"  (1.499 m)  Wt 108 lb (48.988 kg)  BMI 21.80 kg/m2  SpO2 99% Physical Exam  Constitutional: She is oriented to person, place, and time. She appears well-developed and well-nourished. No distress.  HENT:  Head: Normocephalic and atraumatic.  Eyes: EOM are normal. Pupils are equal, round, and reactive to light.  Neck: Normal range of motion. Neck supple.  Cardiovascular: Normal rate and regular rhythm.  Exam reveals no gallop and no friction rub.   No murmur heard. Pulmonary/Chest: Effort normal and breath sounds normal. No respiratory distress. She has no wheezes.  Abdominal: Soft. Bowel sounds are normal. She exhibits no distension. There is no tenderness.  Musculoskeletal: Normal range of motion.  Neurological: She is alert and oriented to person, place, and time. No cranial nerve deficit. She exhibits normal muscle tone. Coordination normal.  Skin: Skin is warm and dry. She is not diaphoretic.  Nursing  note and vitals reviewed.   ED Course  Procedures (including critical care time) Labs Review Labs Reviewed  BASIC METABOLIC PANEL - Abnormal; Notable for the following:    Glucose, Bld 109 (*)    BUN 35 (*)    Creatinine, Ser 1.50 (*)    GFR calc non Af Amer 29 (*)    GFR calc Af Amer 33 (*)    All other components within normal limits  CBC - Abnormal; Notable for the following:    RBC 3.79 (*)    Hemoglobin 11.6 (*)    HCT 34.4 (*)    All other components within normal limits  CBG MONITORING, ED - Abnormal; Notable for the following:    Glucose-Capillary 111 (*)    All other components within normal limits  URINALYSIS, ROUTINE W REFLEX MICROSCOPIC (NOT AT [facility])    Imaging Review  No results found. I have personally reviewed and evaluated these images and lab results as part of my medical decision-making.   EKG Interpretation   Date/Time:  Monday September 12 2015 12:33:17 EDT Ventricular Rate:  57 PR Interval:  134 QRS Duration: 80 QT Interval:  486 QTC Calculation: 473 R Axis:   52 Text Interpretation:  Sinus bradycardia Otherwise normal ECG Unchanged  from 02/14/2015 Confirmed by Charlaine Utsey  MD, Tenleigh Byer ([id number]) on 09/12/2015  4:25:17 PM      MDM   Final diagnoses:  None    Patient is a 82 year old female who presents with recurrent syncope. She is hemodynamically stable and neurologically intact. This episode was witnessed by the granddaughter who is present at bedside. This does not appear to be seizure activity. Her laboratory studies are essentially unremarkable and unchanged from her baseline. She had a CT of the head one month ago for a similar episode which was negative. I see no reason to repeat this. The patient is not happy that she was sent here and is adamant about going home. I see no indication at this time for admission. She will be discharged with instructions to follow-up with cardiology to discuss event monitoring.    Geoffery Lyons, MD 09/12/15 [phone]

## 2015-09-12 NOTE — Telephone Encounter (Signed)
Spoke with pt's son and advised him that Dr. Shirlee LatchMcLean is in clinic today so Im not sure if he would even be able to come by. Advised son that I did send the note from pt's visit earlier today to Dr. Shirlee LatchMcLean. Apologized to son that unfortunately we dont have any control over time at the hospital as they triage things and work as hard as they can. Son verbalized understanding and thanked me for my help.

## 2015-09-12 NOTE — ED Notes (Signed)
Pt accompanied by graddaughter- Per family pt was seen at [facility] for routine check up and had a syncopal episode witnessed by staff. Family reports 4th episode since January. Pt does not remember the episode. Family sts pt did not fall to ground or hit head. PT a/o x 4, no neuro deficits.

## 2015-09-12 NOTE — Discharge Instructions (Signed)
The number for the cardiology clinic has been provided for you in this discharge summary to call to arrange a follow-up appointment. They may recommend a Holter or event monitor to help determine the cause of your passing out spells.  Return to the emergency department if you develop any new and concerning symptoms.   Syncope Syncope is a medical term for fainting or passing out. This means you lose consciousness and drop to the ground. People are generally unconscious for less than 5 minutes. You may have some muscle twitches for up to 15 seconds before waking up and returning to normal. Syncope occurs more often in older adults, but it can happen to anyone. While most causes of syncope are not dangerous, syncope can be a sign of a serious medical problem. It is important to seek medical care.  CAUSES  Syncope is caused by a sudden drop in blood flow to the brain. The specific cause is often not determined. Factors that can bring on syncope include:  Taking medicines that lower blood pressure.  Sudden changes in posture, such as standing up quickly.  Taking more medicine than prescribed.  Standing in one place for too long.  Seizure disorders.  Dehydration and excessive exposure to heat.  Low blood sugar (hypoglycemia).  Straining to have a bowel movement.  Heart disease, irregular heartbeat, or other circulatory problems.  Fear, emotional distress, seeing blood, or severe pain. SYMPTOMS  Right before fainting, you may:  Feel dizzy or light-headed.  Feel nauseous.  See all white or all black in your field of vision.  Have cold, clammy skin. DIAGNOSIS  Your health care provider will ask about your symptoms, perform a physical exam, and perform an electrocardiogram (ECG) to record the electrical activity of your heart. Your health care provider may also perform other heart or blood tests to determine the cause of your syncope which may include:  Transthoracic echocardiogram  (TTE). During echocardiography, sound waves are used to evaluate how blood flows through your heart.  Transesophageal echocardiogram (TEE).  Cardiac monitoring. This allows your health care provider to monitor your heart rate and rhythm in real time.  Holter monitor. This is a portable device that records your heartbeat and can help diagnose heart arrhythmias. It allows your health care provider to track your heart activity for several days, if needed.  Stress tests by exercise or by giving medicine that makes the heart beat faster. TREATMENT  In most cases, no treatment is needed. Depending on the cause of your syncope, your health care provider may recommend changing or stopping some of your medicines. HOME CARE INSTRUCTIONS  Have someone stay with you until you feel stable.  Do not drive, use machinery, or play sports until your health care provider says it is okay.  Keep all follow-up appointments as directed by your health care provider.  Lie down right away if you start feeling like you might faint. Breathe deeply and steadily. Wait until all the symptoms have passed.  Drink enough fluids to keep your urine clear or pale yellow.  If you are taking blood pressure or heart medicine, get up slowly and take several minutes to sit and then stand. This can reduce dizziness. SEEK IMMEDIATE MEDICAL CARE IF:   You have a severe headache.  You have unusual pain in the chest, abdomen, or back.  You are bleeding from your mouth or rectum, or you have black or tarry stool.  You have an irregular or very fast heartbeat.  You  have pain with breathing.  You have repeated fainting or seizure-like jerking during an episode.  You faint when sitting or lying down.  You have confusion.  You have trouble walking.  You have severe weakness.  You have vision problems. If you fainted, call your local emergency services (911 in [place] Do not drive yourself to the hospital.    This  information is not intended to replace advice given to you by your health care provider. Make sure you discuss any questions you have with your health care provider.   Document Released: 02/12/2005 Document Revised: 06/29/2014 Document Reviewed: 04/13/2011 Elsevier Interactive Patient Education [employer]

## 2015-09-12 NOTE — Progress Notes (Signed)
1.) Reason for visit: BP Check  2.) Name of MD requesting visit: Nada BoozerLaura Ingold, NP  3.) ROS related to problem: Pt states that she has been feeling fine.  Granddaughter with pt and states that she has not checked her BP at home since last visit. Granddaughter states pt has not had any symptoms of high or low BP since last seen.  BP today in office was 102/58, HR 58.  Pt denied symptoms today.  4.) Assessment and plan per MD: Will send information to Nada BoozerLaura Ingold, NP for review and advisement.  While here pt was standing infront of nurses station so that I could schedule f/u with Dr. Shirlee LatchMcLean.  Granddaughter holding onto pt and pt started to pass out (~10:50AM).  Grabbed chair but ended up having to lower pt to the floor gently.  Granddaughter and I then placed pt in chair and she was very easy to arouse.  Granddaughter did not want me to notify MD and states that "she has these episodes and that's one reason why she is seeing Cardiology".  Granddaughter also states that pt "has these episodes" and she has taken her to ED for it before.  Granddaughter left with pt.  Spoke with DOD.  Tried to call son, DPR on file, no answer but did leave VM.  Spoke with Nada BoozerLaura Ingold, NP and she asked that I try the son again and agrees that pt does need to be seen at ED.  Attempted to call son again, no answer.  Found granddaughter's number in chart and spoke with her and she is now agreeable to [facility].  Will make Dr. Shirlee LatchMcLean and Nada BoozerLaura Ingold, NP aware.

## 2015-09-12 NOTE — Addendum Note (Signed)
Addended by: Tonita PhoenixBOWDEN, Gwenetta Devos K on: 09/12/2015 10:18 AM   Modules accepted: Orders

## 2015-09-12 NOTE — Telephone Encounter (Signed)
New message      [name] collapsed in the waiting room this am.  She was told to go to the waiting room by the nurse.  Son states [name] is still sitting in the ER waiting room waiting to be seen by Dr Shirlee LatchMcLean.  He want someone to call the ER and let them know [name] is there so that she can be see soon.  If any problems, please call son

## 2015-09-12 NOTE — ED Notes (Signed)
EDP at bedside  

## 2015-09-15 ENCOUNTER — Telehealth: Payer: Self-pay | Admitting: Cardiology

## 2015-09-15 ENCOUNTER — Telehealth: Payer: Self-pay | Admitting: *Deleted

## 2015-09-15 DIAGNOSIS — R55 Syncope and collapse: Secondary | ICD-10-CM

## 2015-09-15 NOTE — Telephone Encounter (Signed)
New message      Returning a call to the nurse from late yesterday.   OK to leave msg on vm---son will not be available most of the day to talk

## 2015-09-15 NOTE — Telephone Encounter (Signed)
Spoke with pt's son and informed him of plan for event monitor. Son asked that appt be made not next week but the following week as he is retiring next week and will be able to bring her anytime the following week. Advised son I will place order and someone will call him to make that appt and a new pt appt with Dr. Delton SeeNelson. Son verbalized understanding and was in agreement with this plan.

## 2015-09-15 NOTE — Telephone Encounter (Signed)
-----   Message from Leone BrandLaura R Ingold, NP sent at 09/13/2015  7:58 AM EDT ----- Pt's cholesterol is great.  Maybe she needs to increase fluids more at least 4-5 glasses of liquid a day.  Maybe add Ensure.  Her BP was good in ER but maybe it drops and causes the episodes of passing out.   Maybe knee high support hose from drug store or we could order.  Please make follow up with Dr. Delton SeeNelson in 1 month or so- Dr. Shirlee LatchMcLean recommended Dr. Delton SeeNelson as [facility].  Thanks.

## 2015-09-15 NOTE — Telephone Encounter (Signed)
-----   Message from Leone BrandLaura R Ingold, NP sent at 09/15/2015  8:03 AM EDT ----- Yes do 2 week event monitor.  But follow up with Dr. Delton SeeNelson her new cardiologist.  Per Dr. Shirlee LatchMcLean   ----- Message -----    From: Julio SicksJennifer L Bowers, LPN    Sent: 1/[id number]/82957/19/2017   8:30 AM      To: Leone BrandLaura R Ingold, NP  Dawayne CirriHey Laura,  Should we go ahead and order the event monitor for [name]?  The office hasn't received anything yet in regards to ER visit and follow up so I was just trying to get ahead of the game.  Since you seen her recently and she was seen in ED, I didn't know if she should come in for an office visit or if we should just order the monitor and have it placed?  I don't see any med changes either from the ED and I know there was a question about the Amlodipine.  Thanks DIRECT[employer]Jennifer

## 2015-09-15 NOTE — Telephone Encounter (Signed)
Per Raiford Nobleick, DPR on file, left a detailed message re: lab result and recommendations on voicemail.

## 2015-09-19 ENCOUNTER — Ambulatory Visit: Payer: Medicare Other | Admitting: Adult Health

## 2015-09-19 ENCOUNTER — Other Ambulatory Visit: Payer: Self-pay

## 2015-09-19 MED ORDER — ATORVASTATIN CALCIUM 20 MG PO TABS: 20.0000 mg | ORAL_TABLET | Freq: Every day | ORAL | 1 refills | Status: DC

## 2015-09-20 ENCOUNTER — Encounter: Payer: Self-pay | Admitting: Adult Health

## 2015-09-20 ENCOUNTER — Ambulatory Visit (INDEPENDENT_AMBULATORY_CARE_PROVIDER_SITE_OTHER): Payer: Medicare Other | Admitting: Adult Health

## 2015-09-20 VITALS — BP 130/80 | Temp 98.1°F | Ht 59.0 in | Wt 111.8 lb

## 2015-09-20 DIAGNOSIS — I10 Essential (primary) hypertension: Secondary | ICD-10-CM | POA: Diagnosis not present

## 2015-09-20 DIAGNOSIS — F0390 Unspecified dementia without behavioral disturbance: Secondary | ICD-10-CM | POA: Diagnosis not present

## 2015-09-20 DIAGNOSIS — I6523 Occlusion and stenosis of bilateral carotid arteries: Secondary | ICD-10-CM

## 2015-09-20 DIAGNOSIS — R296 Repeated falls: Secondary | ICD-10-CM

## 2015-09-20 NOTE — Patient Instructions (Signed)
It was great seeing you today   I would like someone to monitor your blood pressure at home, 2-3 days a week. Please let me know if the blood pressure drops below 110/70 consistently.   Have a small snack around 10:30 am.   Follow up in 6 months for your next physical.

## 2015-09-20 NOTE — Progress Notes (Signed)
Subjective:    Patient ID: [name], female    DOB: March 14, 1920, 82 y.o.   MRN: [id number]  HPI  82 year old female who presents with her granddaughter for this visit. She is here today for 6 months follow up regarding hypertension, frequent falls and dementia. Over all she reports " I feel good, I feel nice and healthy. I do not have any issues."   Since the last time I have seen here she has been in the ER twice for falls. The first being on 08/02/2015- slip and fall and on 09/12/2015 - for possible syncopal episode at which time her granddaughter witnessed her have a few seconds of unconsciousness.   She is addiment that her falls have been from her shoes and " having too much grip".   She denies any significant injuries after the falls. She also denies " passing out".   Family is concerned that her blood pressure and/or blood sugars may be dropping to much.   She is supposed to follow up with cardiology for for an event monitor.   Review of Systems  Constitutional: Negative.   Respiratory: Negative.   Cardiovascular: Negative.   Gastrointestinal: Negative.   Genitourinary: Negative.   Neurological: Negative.   All other systems reviewed and are negative.  Past Medical History:  Diagnosis Date  . CHF (congestive heart failure) (HCC)    Diastolic CHF. Echo (3/10) EF 45-50% with periapical akinesis. Mild LVH. Mild MR. Pseudonormal diastolic function. Echo (4/10): EF 65% with mild focal basal septal hypertrophy. Pseudonormal diastolic function. Normal wall motion. Moderate biatrial enlargement. PASP 61 mmHg. Echo (4/11): EF 55%, inferobasal hypokinesis, mild MR, PA systolic pressure 44 mmHg.  [name] Coronary artery disease    NSTEMI 3/10. LHC showed 80% mLAD, 90% mCFX. She had PCI with Endeavor 2.5 x 12 to LAD and Endeavor 3.0 x 12 to CFX  . Dementia   . Expressive aphasia syndrome    More likely atypical migraine than TIA. Carotid dopplers (8/09) with 40-60% LICA stenosis.  .  Frequent falls   . Horner's syndrome    left  . Hypertension   . Hypothyroidism   . Lacunar stroke (HCC)    on CT  . SVT (supraventricular tachycardia) (HCC)    transient    Social History   Social History  . Marital status: Widowed    Spouse name: N/A  . Number of children: N/A  . Years of education: N/A   Occupational History  . Not on file.   Social History Main Topics  . Smoking status: Former Smoker    Types: Cigarettes    Quit date: 05/25/1950  . Smokeless tobacco: Never Used  . Alcohol use No  . Drug use: No  . Sexual activity: Yes   Other Topics Concern  . Not on file   Social History Narrative  . No narrative on file    No past surgical history on file.  Family History  Problem Relation Age of Onset  . Hypertension Mother   . COPD Father   . Heart attack Neg Hx     No Known Allergies  Current Outpatient Prescriptions on File Prior to Visit  Medication Sig Dispense Refill  . amLODipine (NORVASC) 5 MG tablet Take 1 tablet (5 mg total) by mouth daily. 30 tablet 9  . aspirin 81 MG tablet Take 162 mg by mouth daily.     [name] atorvastatin (LIPITOR) 20 MG tablet Take 1 tablet (20 mg total) by  mouth daily. 90 tablet 1  . carvedilol (COREG) 12.5 MG tablet TAKE 1 TABLET BY MOUTH TWICE A DAY 60 tablet 0  . cloNIDine (CATAPRES) 0.1 MG tablet Take 0.1 mg by mouth 2 (two) times daily.    [name] donepezil (ARICEPT) 10 MG tablet Take 1 tablet (10 mg total) by mouth at bedtime. 30 tablet 11  . lactulose, encephalopathy, (CHRONULAC) 10 GM/15ML SOLN TAKE 15 MLS BY MOUTH DAILY FOR BOWELS. INCREASE TO 30 IF CONSTIPATED 240 mL 1  . levETIRAcetam (KEPPRA) 250 MG tablet Take 1 tablet (250 mg total) by mouth 2 (two) times daily. 180 tablet 3  . levothyroxine (SYNTHROID, LEVOTHROID) 25 MCG tablet Take 25 mcg by mouth daily before breakfast.    . nitroGLYCERIN (NITROSTAT) 0.4 MG SL tablet Place 0.4 mg under the tongue every 5 (five) minutes as needed for chest pain (3 DOSES MAX).      [name] sertraline (ZOLOFT) 25 MG tablet Take 25 mg by mouth daily.    . valsartan (DIOVAN) 160 MG tablet TAKE 1 TABLET BY MOUTH TWICE A DAY 180 tablet 0  . Vitamin D, Ergocalciferol, (DRISDOL) 50000 units CAPS capsule TAKE ONE CAPSULE BY MOUTH WEEKLY 30 capsule 0   No current facility-administered medications on file prior to visit.     BP 130/80 (BP Location: Left Arm)   Temp 98.1 F (36.7 C) (Oral)   Ht  (1.499 m)   Wt 111 lb 12.8 oz (50.7 kg)   BMI 22.58 kg/m       Objective:   Physical Exam  Constitutional: She is oriented to person, place, and time. She appears well-developed and well-nourished. No distress.  Cardiovascular: Normal rate, regular rhythm, normal heart sounds and intact distal pulses.  Exam reveals no gallop and no friction rub.   No murmur heard. Pulmonary/Chest: Effort normal and breath sounds normal. No respiratory distress. She has no wheezes. She has no rales. She exhibits no tenderness.  Neurological: She is alert and oriented to person, place, and time.  Skin: Skin is warm and dry. No rash noted. She is not diaphoretic. No erythema. No pallor.  Psychiatric: She has a normal mood and affect. Her behavior is normal. Judgment and thought content normal.  Nursing note and vitals reviewed.      Assessment & Plan:  1. Falls frequently - Her BP is good in the office and was in the hospital.  - Advised to have family monitor BP at home and inform this provider if it is below 110/70 consistently.  - Also advised patient to have a snack mid morning to help keep her blood sugars controlled - Stay well hydrated - Follow up with cardiology for event monitor  2. Essential hypertension - Well controlled in the office today - No change  3. Dementia, without behavioral disturbance - Alert and oriented x 4 - Continue with Aricept  Shirline Frees, NP

## 2015-09-25 ENCOUNTER — Other Ambulatory Visit: Payer: Self-pay | Admitting: Cardiology

## 2015-10-04 ENCOUNTER — Emergency Department (HOSPITAL_COMMUNITY)
Admission: EM | Admit: 2015-10-04 | Discharge: 2015-10-05 | Disposition: A | Discharge status: Home / Self Care | Payer: Medicare Other | Attending: Emergency Medicine | Admitting: Emergency Medicine

## 2015-10-04 ENCOUNTER — Ambulatory Visit (INDEPENDENT_AMBULATORY_CARE_PROVIDER_SITE_OTHER): Payer: Medicare Other

## 2015-10-04 ENCOUNTER — Encounter (HOSPITAL_COMMUNITY): Payer: Self-pay | Admitting: Emergency Medicine

## 2015-10-04 DIAGNOSIS — Z7982 Long term (current) use of aspirin: Secondary | ICD-10-CM | POA: Diagnosis not present

## 2015-10-04 DIAGNOSIS — Z8673 Personal history of transient ischemic attack (TIA), and cerebral infarction without residual deficits: Secondary | ICD-10-CM | POA: Diagnosis not present

## 2015-10-04 DIAGNOSIS — S0101XA Laceration without foreign body of scalp, initial encounter: Secondary | ICD-10-CM | POA: Insufficient documentation

## 2015-10-04 DIAGNOSIS — W19XXXA Unspecified fall, initial encounter: Secondary | ICD-10-CM | POA: Diagnosis not present

## 2015-10-04 DIAGNOSIS — E039 Hypothyroidism, unspecified: Secondary | ICD-10-CM | POA: Insufficient documentation

## 2015-10-04 DIAGNOSIS — I2510 Atherosclerotic heart disease of native coronary artery without angina pectoris: Secondary | ICD-10-CM | POA: Diagnosis not present

## 2015-10-04 DIAGNOSIS — Y939 Activity, unspecified: Secondary | ICD-10-CM | POA: Diagnosis not present

## 2015-10-04 DIAGNOSIS — R55 Syncope and collapse: Secondary | ICD-10-CM

## 2015-10-04 DIAGNOSIS — I110 Hypertensive heart disease with heart failure: Secondary | ICD-10-CM | POA: Insufficient documentation

## 2015-10-04 DIAGNOSIS — I5032 Chronic diastolic (congestive) heart failure: Secondary | ICD-10-CM | POA: Diagnosis not present

## 2015-10-04 DIAGNOSIS — S0093XA Contusion of unspecified part of head, initial encounter: Secondary | ICD-10-CM | POA: Diagnosis not present

## 2015-10-04 DIAGNOSIS — Y999 Unspecified external cause status: Secondary | ICD-10-CM | POA: Diagnosis not present

## 2015-10-04 DIAGNOSIS — Z87891 Personal history of nicotine dependence: Secondary | ICD-10-CM | POA: Insufficient documentation

## 2015-10-04 DIAGNOSIS — Y92009 Unspecified place in unspecified non-institutional (private) residence as the place of occurrence of the external cause: Secondary | ICD-10-CM | POA: Diagnosis not present

## 2015-10-04 DIAGNOSIS — S098XXA Other specified injuries of head, initial encounter: Secondary | ICD-10-CM | POA: Diagnosis not present

## 2015-10-04 DIAGNOSIS — S199XXA Unspecified injury of neck, initial encounter: Secondary | ICD-10-CM | POA: Diagnosis not present

## 2015-10-04 DIAGNOSIS — S0003XA Contusion of scalp, initial encounter: Secondary | ICD-10-CM | POA: Diagnosis not present

## 2015-10-04 DIAGNOSIS — S0990XA Unspecified injury of head, initial encounter: Secondary | ICD-10-CM | POA: Diagnosis present

## 2015-10-04 DIAGNOSIS — S0190XA Unspecified open wound of unspecified part of head, initial encounter: Secondary | ICD-10-CM | POA: Diagnosis not present

## 2015-10-04 MED ORDER — LIDOCAINE HCL (PF) 1 % IJ SOLN
10.0000 mL | Freq: Once | INTRAMUSCULAR | Status: DC
  Filled 2015-10-04: qty 10

## 2015-10-04 NOTE — ED Triage Notes (Signed)
Pt arrives via EMS from home by self with c/o fall, pt alert and talkative upon arrival to room. Towel in place of ccollar. Tripped over rug, landed on knees initially, then hit her head. Hematoma/abrasion to posterior skull.   Hx HTN. Takes aspirin daily.

## 2015-10-04 NOTE — ED Provider Notes (Signed)
[facility]-EMER[facility]ENCY DEPT Provider Note   CSN: [id number] Arrival date & time: 10/04/15  2053  First Provider Contact:  None       History   Chief Complaint Chief Complaint  Patient presents with  . Fall    HPI [name] is a 82 y.o. female.  HPI  Is a 82 year old patient who presents for fall. She's had multiple falls over the past year. She lives at home alone. She reports that she remembers it did not pass out. Her son received a phone call from her right after the episode, and evaluated her at her home.  She was brought in for swelling in the back of her head. She denies any current symptoms..  Past Medical History:  Diagnosis Date  . CHF (congestive heart failure) (HCC)    Diastolic CHF. Echo (3/10) EF 45-50% with periapical akinesis. Mild LVH. Mild MR. Pseudonormal diastolic function. Echo (4/10): EF 65% with mild focal basal septal hypertrophy. Pseudonormal diastolic function. Normal wall motion. Moderate biatrial enlargement. PASP 61 mmHg. Echo (4/11): EF 55%, inferobasal hypokinesis, mild MR, PA systolic pressure 44 mmHg.  [name]. Coronary artery disease    NSTEMI 3/10. LHC showed 80% mLAD, 90% mCFX. She had PCI with Endeavor 2.5 x 12 to LAD and Endeavor 3.0 x 12 to CFX  . Dementia   . Expressive aphasia syndrome    More likely atypical migraine than TIA. Carotid dopplers (8/09) with 40-60% LICA stenosis.  . Frequent falls   . Horner's syndrome    left  . Hypertension   . Hypothyroidism   . Lacunar stroke (HCC)    on CT  . SVT (supraventricular tachycardia) (HCC)    transient    Patient Active Problem List   Diagnosis Date Noted  . Abnormality of gait 02/07/2015  . Memory loss 07/17/2013  . Seizure disorder, focal motor (HCC) 03/26/2012  . Carotid stenosis 06/26/2011  . Depression 05/10/2010  . HYPERLIPIDEMIA-MIXED 11/05/2008  . DIASTOLIC HEART FAILURE, CHRONIC 09/17/2008  . UNSPECIFIED VENOUS INSUFFICIENCY 06/09/2008  . CORONARY ATHEROSCLEROSIS NATIVE CORONARY  ARTERY 05/12/2008  . Hypothyroidism 12/05/2007  . CONSTIPATION, DRUG INDUCED 09/02/2007  . BREAST CYST 09/02/2007  . UNS ADVRS EFF UNS RX MEDICINAL&BIOLOGICAL SBSTNC 06/04/2007  . Essential hypertension 07/25/2006  . OSTEOPOROSIS 07/25/2006  . CEREBROVASCULAR ACCIDENT, HX OF 07/25/2006    History reviewed. No pertinent surgical history.  OB History    No data available       Home Medications    Prior to Admission medications   Medication Sig Start Date End Date Taking? Authorizing Provider  amLODipine (NORVASC) 5 MG tablet Take 1 tablet (5 mg total) by mouth daily. 09/01/15   Leone BrandLaura R Ingold, NP  aspirin 81 MG tablet Take 162 mg by mouth daily.     Historical Provider, MD  atorvastatin (LIPITOR) 20 MG tablet Take 1 tablet (20 mg total) by mouth daily. 09/19/15   Laurey Moralealton S McLean, MD  carvedilol (COREG) 12.5 MG tablet TAKE 1 TABLET BY MOUTH TWICE A DAY 09/26/15   Laurey Moralealton S McLean, MD  cloNIDine (CATAPRES) 0.1 MG tablet Take 0.1 mg by mouth 2 (two) times daily.    Historical Provider, MD  donepezil (ARICEPT) 10 MG tablet Take 1 tablet (10 mg total) by mouth at bedtime. 07/21/15   Levert FeinsteinYijun Yan, MD  lactulose, encephalopathy, (CHRONULAC) 10 GM/15ML SOLN TAKE 15 MLS BY MOUTH DAILY FOR BOWELS. INCREASE TO 30 IF CONSTIPATED 03/07/15   Shirline Freesory Nafziger, NP  levETIRAcetam (KEPPRA) 250 MG tablet Take  1 tablet (250 mg total) by mouth 2 (two) times daily. 08/03/15   Levert Feinstein, MD  levothyroxine (SYNTHROID, LEVOTHROID) 25 MCG tablet Take 25 mcg by mouth daily before breakfast.    Historical Provider, MD  nitroGLYCERIN (NITROSTAT) 0.4 MG SL tablet Place 0.4 mg under the tongue every 5 (five) minutes as needed for chest pain (3 DOSES MAX).    Historical Provider, MD  sertraline (ZOLOFT) 25 MG tablet Take 25 mg by mouth daily.    Historical Provider, MD  valsartan (DIOVAN) 160 MG tablet TAKE 1 TABLET BY MOUTH TWICE A DAY 02/07/15   Laurey Morale, MD  Vitamin D, Ergocalciferol, (DRISDOL) 50000 units CAPS capsule  TAKE ONE CAPSULE BY MOUTH WEEKLY 05/19/15   Shirline Frees, NP    Family History Family History  Problem Relation Age of Onset  . Hypertension Mother   . COPD Father   . Heart attack Neg Hx     Social History Social History  Substance Use Topics  . Smoking status: Former Smoker    Types: Cigarettes    Quit date: 05/25/1950  . Smokeless tobacco: Never Used  . Alcohol use No     Allergies   Review of patient's allergies indicates no known allergies.   Review of Systems Review of Systems  Constitutional: Negative for chills and fever.  HENT: Negative for ear pain and sore throat.   Eyes: Negative for pain and visual disturbance.  Respiratory: Negative for cough and shortness of breath.   Cardiovascular: Negative for chest pain and palpitations.  Gastrointestinal: Negative for abdominal pain and vomiting.  Genitourinary: Negative for dysuria and hematuria.  Musculoskeletal: Negative for arthralgias and back pain.  Skin: Negative for color change and rash.  Neurological: Negative for seizures and syncope.  All other systems reviewed and are negative.    Physical Exam Updated Vital Signs BP 154/67 (BP Location: Left Arm)   Pulse 75   Temp 98.1 F (36.7 C) (Oral)   Resp 20   SpO2 97%   Physical Exam  Constitutional: She appears well-developed and well-nourished. No distress.  HENT:  Head: Normocephalic.  Hematoma to posterior scalp with 1cm laceration  Eyes: Conjunctivae are normal.  Neck: Neck supple.  Cardiovascular: Normal rate and regular rhythm.   No murmur heard. Pulmonary/Chest: Effort normal and breath sounds normal. No respiratory distress.  Abdominal: Soft. There is no tenderness.  Musculoskeletal: She exhibits no edema.  Neurological: She is alert.  Skin: Skin is warm and dry.  Psychiatric: She has a normal mood and affect.  Nursing note and vitals reviewed.    ED Treatments / Results  Labs (all labs ordered are listed, but only abnormal  results are displayed) Labs Reviewed - No data to display  EKG  EKG Interpretation  Date/Time:  Tuesday October 04 2015 21:02:32 EDT Ventricular Rate:  60 PR Interval:    QRS Duration: 87 QT Interval:  429 QTC Calculation: 429 R Axis:   -3 Text Interpretation:  Sinus rhythm Abnormal R-wave progression, early transition Borderline T abnormalities, inferior leads no significant change since September 12 2015 Confirmed by Criss Alvine MD, SCOTT [phone]) on 10/04/2015 9:08:41 PM       Radiology No results found.  Procedures Procedures (including critical care time)  Medications Ordered in ED Medications - No data to display   Initial Impression / Assessment and Plan / ED Course  I have reviewed the triage vital signs and the nursing notes.  Pertinent labs & imaging results that were available  during my care of the patient were reviewed by me and considered in my medical decision making (see chart for details).  Clinical Course    This patient presented after a fall. Not completely clear of the etiology given her dementia, but she does not think she passed out. EKG NSR. Basic labs showed mild decrease in Hb, otherwise unremarkable. She doesn't seem to have any cause of acute blood loss. She has no thoracic, abdominal, or pelvic tenderness to suggest injury in these areas. CT head and c-spine performed and was still pending when the patient left. Head lac stapled.    The patient and family wished to leave due to them waiting so long for the CT, as a result of [facility] downtime. I did explain that it is not possible for [place] to ensure there is no bleed in her head without this scan. On my exam, there are no signs of depressed skull fx, and no hemotympanum. She is at mental baseline with no complaints during this visit.   Discussed decreased Hb with family, and they wished to have the PCP followup on this. Will encouraged PCP f/u for Hb recheck and staple removal. She was HDS at discharge.  2:17 AM  CT reviewed after patient discharge and showed NAICA.    Final Clinical Impressions(s) / ED Diagnoses   Final diagnoses:  None    New Prescriptions New Prescriptions   No medications on file     Marcelina Morel, MD 10/05/15 0132    Marcelina Morel, MD 10/05/15 [id number]    Pricilla Loveless, MD 10/06/15 [phone]

## 2015-10-05 ENCOUNTER — Encounter (HOSPITAL_COMMUNITY): Payer: Self-pay | Admitting: Radiology

## 2015-10-05 ENCOUNTER — Emergency Department (HOSPITAL_COMMUNITY): Payer: Medicare Other

## 2015-10-05 DIAGNOSIS — S199XXA Unspecified injury of neck, initial encounter: Secondary | ICD-10-CM | POA: Diagnosis not present

## 2015-10-05 DIAGNOSIS — S0003XA Contusion of scalp, initial encounter: Secondary | ICD-10-CM | POA: Diagnosis not present

## 2015-10-05 DIAGNOSIS — S0101XA Laceration without foreign body of scalp, initial encounter: Secondary | ICD-10-CM | POA: Diagnosis not present

## 2015-10-05 LAB — CBC WITH DIFFERENTIAL/PLATELET
BASOS ABS: 0 10*3/uL (ref 0.0–0.1)
Basophils Relative: 0 %
EOS PCT: 6 %
Eosinophils Absolute: 0.4 10*3/uL (ref 0.0–0.7)
HCT: 29.1 % — ABNORMAL LOW (ref 36.0–46.0)
HEMOGLOBIN: 9.8 g/dL — AB (ref 12.0–15.0)
LYMPHS PCT: 23 %
Lymphs Abs: 1.7 10*3/uL (ref 0.7–4.0)
MCH: 31 pg (ref 26.0–34.0)
MCHC: 33.7 g/dL (ref 30.0–36.0)
MCV: 92.1 fL (ref 78.0–100.0)
Monocytes Absolute: 1 10*3/uL (ref 0.1–1.0)
Monocytes Relative: 13 %
NEUTROS ABS: 4.2 10*3/uL (ref 1.7–7.7)
NEUTROS PCT: 57 %
PLATELETS: 171 10*3/uL (ref 150–400)
RBC: 3.16 MIL/uL — AB (ref 3.87–5.11)
RDW: 14 % (ref 11.5–15.5)
WBC: 7.2 10*3/uL (ref 4.0–10.5)

## 2015-10-05 LAB — BASIC METABOLIC PANEL
ANION GAP: 7 (ref 5–15)
BUN: 32 mg/dL — ABNORMAL HIGH (ref 6–20)
CHLORIDE: 104 mmol/L (ref 101–111)
CO2: 26 mmol/L (ref 22–32)
Calcium: 9.3 mg/dL (ref 8.9–10.3)
Creatinine, Ser: 1.4 mg/dL — ABNORMAL HIGH (ref 0.44–1.00)
GFR calc Af Amer: 36 mL/min — ABNORMAL LOW (ref >60)
GFR, EST NON AFRICAN AMERICAN: 31 mL/min — AB (ref >60)
GLUCOSE: 110 mg/dL — AB (ref 65–99)
POTASSIUM: 4.1 mmol/L (ref 3.5–5.1)
Sodium: 137 mmol/L (ref 135–145)

## 2015-10-11 ENCOUNTER — Encounter: Payer: Self-pay | Admitting: Nurse Practitioner

## 2015-10-12 ENCOUNTER — Encounter: Payer: Self-pay | Admitting: Adult Health

## 2015-10-12 ENCOUNTER — Ambulatory Visit (INDEPENDENT_AMBULATORY_CARE_PROVIDER_SITE_OTHER): Payer: Medicare Other | Admitting: Adult Health

## 2015-10-12 VITALS — BP 130/58 | Temp 98.4°F | Wt 114.2 lb

## 2015-10-12 DIAGNOSIS — I6523 Occlusion and stenosis of bilateral carotid arteries: Secondary | ICD-10-CM

## 2015-10-12 DIAGNOSIS — Z4802 Encounter for removal of sutures: Secondary | ICD-10-CM | POA: Diagnosis not present

## 2015-10-12 DIAGNOSIS — R7989 Other specified abnormal findings of blood chemistry: Secondary | ICD-10-CM

## 2015-10-12 NOTE — Progress Notes (Signed)
Pre visit review using our clinic review tool, if applicable. No additional management support is needed unless otherwise documented below in the visit note. 

## 2015-10-12 NOTE — Patient Instructions (Signed)
It was great seeing you again.   Your wound looks like it healed well.  I will follow up with you about the blood work

## 2015-10-12 NOTE — Progress Notes (Signed)
   Subjective:    Patient ID: [name], female    DOB: 02/17/1921, 82 y.o.   MRN: [id number]  HPI  82 year old female who presents to the office today for staple removal she had placed on 10/04/2015 in the ER sp/ mechanical fall.   She also needs to have her H&H rechecked as it was low in the ER.   Lab Results  Component Value Date   WBC 7.2 10/04/2015   HGB 9.8 (L) 10/04/2015   HCT 29.1 (L) 10/04/2015   MCV 92.1 10/04/2015   PLT 171 10/04/2015     Review of Systems  Respiratory: Negative.   Cardiovascular: Negative.   Skin: Positive for wound.  Psychiatric/Behavioral: Positive for confusion and decreased concentration.  All other systems reviewed and are negative.      Objective:   Physical Exam  Constitutional: She is oriented to person, place, and time. She appears well-developed and well-nourished. No distress.  Neurological: She is alert and oriented to person, place, and time.  Skin: Skin is warm and dry. No rash noted. She is not diaphoretic. No erythema. No pallor.  Well healed wound on back of head. Has one staple  Psychiatric: She has a normal mood and affect. Her behavior is normal. Thought content normal.  Vitals reviewed.      Assessment & Plan:  1. Encounter for staple removal - 1 staple removed.  - patient tolerated procedure well.  - Monitor for signs of infection   2. Abnormal CBC  - CBC with Differential/Platelet  Shirline Freesory Brennyn Ortlieb, NP

## 2015-10-13 LAB — CBC WITH DIFFERENTIAL/PLATELET
BASOS PCT: 0.3 % (ref 0.0–3.0)
Basophils Absolute: 0 10*3/uL (ref 0.0–0.1)
EOS PCT: 6.9 % — AB (ref 0.0–5.0)
Eosinophils Absolute: 0.5 10*3/uL (ref 0.0–0.7)
HEMATOCRIT: 30.1 % — AB (ref 36.0–46.0)
HEMOGLOBIN: 10.4 g/dL — AB (ref 12.0–15.0)
LYMPHS PCT: 23.8 % (ref 12.0–46.0)
Lymphs Abs: 1.8 10*3/uL (ref 0.7–4.0)
MCHC: 34.4 g/dL (ref 30.0–36.0)
MCV: 91 fl (ref 78.0–100.0)
MONO ABS: 1.1 10*3/uL — AB (ref 0.1–1.0)
Monocytes Relative: 14.5 % — ABNORMAL HIGH (ref 3.0–12.0)
Neutro Abs: 4.2 10*3/uL (ref 1.4–7.7)
Neutrophils Relative %: 54.5 % (ref 43.0–77.0)
Platelets: 223 10*3/uL (ref 150.0–400.0)
RBC: 3.31 Mil/uL — ABNORMAL LOW (ref 3.87–5.11)
RDW: 14.5 % (ref 11.5–15.5)
WBC: 7.7 10*3/uL (ref 4.0–10.5)

## 2015-10-19 ENCOUNTER — Ambulatory Visit (INDEPENDENT_AMBULATORY_CARE_PROVIDER_SITE_OTHER): Payer: Medicare Other | Admitting: Neurology

## 2015-10-19 DIAGNOSIS — R269 Unspecified abnormalities of gait and mobility: Secondary | ICD-10-CM

## 2015-10-19 DIAGNOSIS — I6523 Occlusion and stenosis of bilateral carotid arteries: Secondary | ICD-10-CM | POA: Diagnosis not present

## 2015-10-19 DIAGNOSIS — G40109 Localization-related (focal) (partial) symptomatic epilepsy and epileptic syndromes with simple partial seizures, not intractable, without status epilepticus: Secondary | ICD-10-CM | POA: Diagnosis not present

## 2015-10-19 DIAGNOSIS — R413 Other amnesia: Secondary | ICD-10-CM

## 2015-10-19 MED ORDER — DIVALPROEX SODIUM ER 500 MG PO TB24: 1000.0000 mg | ORAL_TABLET | Freq: Every day | ORAL | 11 refills | Status: DC

## 2015-10-19 NOTE — Progress Notes (Signed)
Chief Complaint  Patient presents with  . Memory Loss    MMSE 26/30 - 9 animals.  She is here with her son, [name], who feels her memory is about the same.  She is still heating up her own meals, reading and doing crossword puzzles.  She is still living at home alone.  He son spends most the day with her and goes back in the evenings to help get her ready for bed.  . Gait Problem    Reports having two falls since last seen.  She was treated for a head injury in the ED with one event.  They would like to discuss having PT again.  . Seizures    He does not feel that she has had any seizures.  He does report 2-3 episodes where she will fall and then be unable to speak for a few minutes before returning to baseline.  She never remembers these events.      PATIENT: [name] DOB: [birth year]  HISTORICAL  [name]  is a 82  -year-old right-handed Caucasian female follow-up for seizure, and cognitive impairment   On June 25, 2008, she presented to the ER with complex partial seizure. At that time she reportedly had a few years history of intermittent episode of difficulty talking that might last about 30 minutes, but never experienced total loss of consciousness. However on April 30, she developed episode of difficulty talking again, followed by head deviation to the right side, whole-body tonic-clonic movements.  The events lasted a few minutes and then she had post-ictal confusion.  She had 3 similar recurrent episodes in 2 hours, followed by transient mild right-sided paralysis.  A MRI of the brain  revealed mild chronic small vessel disease but no acute lesions. She most likely has complex partial seizure stemming from the left frontal region.  She was started on Keppra 250 b.i.d and has no recurrent episodes. Because of her mild right side paralysis she was admitted to a nursing facility for a few months but recovered well and is now back at home. She lives alone with family support.   She is  dong well, lives alone, independent in her daily activity, her son check on her regularly.  She also had a gradual onset memory trouble  UPDATE Feb 5th 2014. She is doing very well, no recurrent seizures, lives alone at her house, son checks on her regularly, she is taking keppra 250mg  bid. she enjoys reading, relies on a cane.  UPDATE May 22nd 2015: She has no recurrent seizure, gradual onset mild memory trouble, getting much worse during the most recent UTI, is on donepezil 5 mg every night, complains of nightmare  UPDATE June 14th 2016: She is with her son at today's clinical visit, she had slow worsening memory trouble, was started on Aricept 5 mg daily by her primary care few months ago, which did help her, she complains of vivid dreams, no recurrent seizures.   UPDATE Feb 07 2015: She has her granddaughter CNA to take care of her at home, Her son reported that she gets agitated easily, she still enjoys reading, has daily routine, she eats meal regularly, sleeps well, increased gait difficulty, using a cane, had a risk to fall.  UPDATE October 19 2015: Son reported 3 episodes of confusion since June 2017, each episode is similar, last one was October 19 2015, her son heard her hit the floor, lying on her side, confused, staring into space, last a few  minutes  She called her son on August 9th 2017 with skull abrasion, personally reviewed CT of head without contrast, Generalized atrophy, periventricular small vessel disease, no acute abnormalities. CT of cervical spine, no acute fracture, multiple level degenerative disc disease   Laboratory evaluation showed hemoglobin 10 point 4, BMP showed creatinine 1.4, hemoglobin was 11 point 6 in July 2017  REVIEW OF SYSTEMS: Full 14 system review of systems performed and notable only for as above  ALLERGIES: No Known Allergies  HOME MEDICATIONS: Current Outpatient Prescriptions on File Prior to Visit  Medication Sig Dispense Refill  .  amLODipine (NORVASC) 5 MG tablet Take 1 tablet (5 mg total) by mouth daily. 30 tablet 9  . aspirin 81 MG tablet Take 162 mg by mouth daily.     [name]. atorvastatin (LIPITOR) 20 MG tablet Take 1 tablet (20 mg total) by mouth daily. 90 tablet 1  . carvedilol (COREG) 12.5 MG tablet TAKE 1 TABLET BY MOUTH TWICE A DAY 60 tablet 11  . cloNIDine (CATAPRES) 0.1 MG tablet Take 0.1 mg by mouth 2 (two) times daily.    [name]. donepezil (ARICEPT) 10 MG tablet Take 1 tablet (10 mg total) by mouth at bedtime. 30 tablet 11  . lactulose, encephalopathy, (CHRONULAC) 10 GM/15ML SOLN TAKE 15 MLS BY MOUTH DAILY FOR BOWELS. INCREASE TO 30 IF CONSTIPATED 240 mL 1  . levETIRAcetam (KEPPRA) 250 MG tablet Take 1 tablet (250 mg total) by mouth 2 (two) times daily. 180 tablet 3  . levothyroxine (SYNTHROID, LEVOTHROID) 25 MCG tablet Take 25 mcg by mouth daily before breakfast.    . nitroGLYCERIN (NITROSTAT) 0.4 MG SL tablet Place 0.4 mg under the tongue every 5 (five) minutes as needed for chest pain (3 DOSES MAX).    [name]. sertraline (ZOLOFT) 25 MG tablet Take 25 mg by mouth daily.    . valsartan (DIOVAN) 160 MG tablet TAKE 1 TABLET BY MOUTH TWICE A DAY 180 tablet 0  . Vitamin D, Ergocalciferol, (DRISDOL) 50000 units CAPS capsule TAKE ONE CAPSULE BY MOUTH WEEKLY 30 capsule 0   No current facility-administered medications on file prior to visit.     PAST MEDICAL HISTORY: Past Medical History:  Diagnosis Date  . CHF (congestive heart failure) (HCC)    Diastolic CHF. Echo (3/10) EF 45-50% with periapical akinesis. Mild LVH. Mild MR. Pseudonormal diastolic function. Echo (4/10): EF 65% with mild focal basal septal hypertrophy. Pseudonormal diastolic function. Normal wall motion. Moderate biatrial enlargement. PASP 61 mmHg. Echo (4/11): EF 55%, inferobasal hypokinesis, mild MR, PA systolic pressure 44 mmHg.  [name]. Coronary artery disease    NSTEMI 3/10. LHC showed 80% mLAD, 90% mCFX. She had PCI with Endeavor 2.5 x 12 to LAD and Endeavor 3.0 x  12 to CFX  . Dementia   . Expressive aphasia syndrome    More likely atypical migraine than TIA. Carotid dopplers (8/09) with 40-60% LICA stenosis.  . Frequent falls   . Horner's syndrome    left  . Hypertension   . Hypothyroidism   . Lacunar stroke (HCC)    on CT  . SVT (supraventricular tachycardia) (HCC)    transient    PAST SURGICAL HISTORY: No past surgical history on file.  FAMILY HISTORY: Family History  Problem Relation Age of Onset  . Hypertension Mother   . COPD Father   . Heart attack Neg Hx     SOCIAL HISTORY:  Social History   Social History  . Marital status: Widowed    Spouse  name: N/A  . Number of children: N/A  . Years of education: N/A   Occupational History  . Not on file.   Social History Main Topics  . Smoking status: Former Smoker    Types: Cigarettes    Quit date: 05/25/1950  . Smokeless tobacco: Never Used  . Alcohol use No  . Drug use: No  . Sexual activity: Yes   Other Topics Concern  . Not on file   Social History Narrative  . No narrative on file     PHYSICAL EXAM   There were no vitals filed for this visit.  Not recorded      There is no height or weight on file to calculate BMI.  PHYSICAL EXAMNIATION:  Gen: NAD, conversant, well nourised, obese, well groomed                     Cardiovascular: Regular rate rhythm, no peripheral edema, warm, nontender. Eyes: Conjunctivae clear without exudates or hemorrhage Neck: Supple, no carotid bruise. Pulmonary: Clear to auscultation bilaterally   NEUROLOGICAL EXAM:  MENTAL STATUS: Speech:    Speech is normal; fluent and spontaneous with normal comprehension.  Cognition: MMSE 26/30, animal naming is 9     Orientation to time, place and person: She is not oriented to date, month, [place]     recent and remote memory, she missed 1 out of 3 recalls     Normal Attention span and concentration     Normal Language, naming, repeating,spontaneous speech     Fund of knowledge       CRANIAL NERVES: CN II: Visual fields are full to confrontation.  Pupils are 4 mm and briskly reactive to light. Visual acuity is 20/20 bilaterally. CN III, IV, VI: extraocular movement are normal. No ptosis. CN V: Facial sensation is intact to pinprick in all 3 divisions bilaterally. Corneal responses are intact.  CN VII: Face is symmetric with normal eye closure and smile. CN VIII: Hearing is normal to rubbing fingers CN IX, X: Palate elevates symmetrically. Phonation is normal. CN XI: Head turning and shoulder shrug are intact CN XII: Tongue is midline with normal movements and no atrophy.  MOTOR: There is no pronator drift of out-stretched arms. Muscle bulk and tone are normal.  She has moderate bilateral ankle dorsiflexion weakness,  REFLEXES: Reflexes are 2+ and symmetric at the biceps, triceps, knees, and absent at  ankles. Plantar responses are flexor.  SENSORY:  Length dependent decreased light touch and pinprick   COORDINATION: Rapid alternating movements and fine finger movements are intact. There is no dysmetria on finger-to-nose and heel-knee-shin.   GAIT/STANCE: She need to push up to get up from seated position, bilateral foot drop, wide based, mildly unsteady  DIAGNOSTIC DATA (LABS, IMAGING, TESTING) - I reviewed patient records, labs, notes, testing and imaging myself where available.   ASSESSMENT AND PLAN  [name] is a 82 y.o. female  Mild Cognitive impairment  MMSE 6126 /30,   Keep aricetp 10mg  every night after dinner  Complex partial seizure  Continue multiple recurrent episode while taking Keppra 250 mg twice a day,  After discuss with her son, she has confusion, irritation, will change her to Depakote ER 500 mg 2 tablets every night  EEG Gait abnormality,  She has bilateral ankle dorsi flexion weakness, most consistent with lumbar radiculopathy,   Home physical therapy  Mild anemia   Continue follow-up with her primary care physician  Levert FeinsteinYijun  Rosealynn Mateus, M.D. Ph.D.  [facility] [address], [place], [place] [address] [phone]

## 2015-10-24 ENCOUNTER — Encounter: Payer: Self-pay | Admitting: Nurse Practitioner

## 2015-10-24 ENCOUNTER — Ambulatory Visit (INDEPENDENT_AMBULATORY_CARE_PROVIDER_SITE_OTHER): Payer: Medicare Other | Admitting: Nurse Practitioner

## 2015-10-24 VITALS — BP 100/50 | HR 56 | Ht 61.0 in | Wt 110.8 lb

## 2015-10-24 DIAGNOSIS — I5032 Chronic diastolic (congestive) heart failure: Secondary | ICD-10-CM | POA: Diagnosis not present

## 2015-10-24 DIAGNOSIS — I10 Essential (primary) hypertension: Secondary | ICD-10-CM

## 2015-10-24 DIAGNOSIS — R55 Syncope and collapse: Secondary | ICD-10-CM

## 2015-10-24 DIAGNOSIS — I6523 Occlusion and stenosis of bilateral carotid arteries: Secondary | ICD-10-CM

## 2015-10-24 MED ORDER — VALSARTAN 160 MG PO TABS: 160.0000 mg | ORAL_TABLET | Freq: Two times a day (BID) | ORAL | 11 refills | Status: DC

## 2015-10-24 NOTE — Patient Instructions (Addendum)
We will be checking the following labs today - NONE   Medication Instructions:    Continue with your current medicines.   I have sent in the refill for your Diovan today.     Testing/Procedures To Be Arranged:  N/A  Follow-Up:   See me in 3 months (not Dr. Delton SeeNelson)    Other Special Instructions:   N/A    If you need a refill on your cardiac medications before your next appointment, please call your pharmacy.   Call the [facility] office at [phone] if you have any questions, problems or concerns.

## 2015-10-24 NOTE — Progress Notes (Signed)
CARDIOLOGY OFFICE NOTE  Date:  10/24/2015    [name] Date of Birth: [birth year] Medical Record #[id number]  PCP:  Shirline Frees, NP  Cardiologist:  Shirlee Latch  Chief Complaint  Patient presents with  . Congestive Heart Failure  . Loss of Consciousness    Follow up post event monitor - seen for Dr. Shirlee Latch    History of Present Illness: [name] is a 82 y.o. female who presents today for a follow up visit. This is an approximate 6 week check. Seen for Dr. Shirlee Latch.   She has a history of diastolic HF, CAD with NSTEMI in 2010 treated with DES to the circumflex and LAD, TIAs versus atypical migraines, & carotid stenosis.  Seen in early July by Nada Boozer, NP and seemed to be doing ok. Noted phone call from last month where she had a passing out spell. Has had an event monitor - this was ok. Looks to be diagnosed with seizures and is on seizure medicine now.   Comes in today. Here with her son. Doing ok. Fell yesterday - son felt like it was due to an untied shoe - no injury. He does not feel it was related to seizure or recurrent syncope. Gets winded easily. Pretty sedentary. Apparently neurology referred for PT - some issue about the payment - but does not sound like it did much good. She has good family support with frequent checks thru out the day. She refuses to get a Lifeline. No chest pain. Weight is up and down. Eats frozen dinners so she will not use the gas stove and burn the house down. Not dizzy or lightheaded. Has just had her morning medicines.   Past Medical History:  Diagnosis Date  . CHF (congestive heart failure) (HCC)    Diastolic CHF. Echo (3/10) EF 45-50% with periapical akinesis. Mild LVH. Mild MR. Pseudonormal diastolic function. Echo (4/10): EF 65% with mild focal basal septal hypertrophy. Pseudonormal diastolic function. Normal wall motion. Moderate biatrial enlargement. PASP 61 mmHg. Echo (4/11): EF 55%, inferobasal hypokinesis, mild MR, PA systolic  pressure 44 mmHg.  [name] Coronary artery disease    NSTEMI 3/10. LHC showed 80% mLAD, 90% mCFX. She had PCI with Endeavor 2.5 x 12 to LAD and Endeavor 3.0 x 12 to CFX  . Dementia   . Expressive aphasia syndrome    More likely atypical migraine than TIA. Carotid dopplers (8/09) with 40-60% LICA stenosis.  . Frequent falls   . Horner's syndrome    left  . Hypertension   . Hypothyroidism   . Lacunar stroke (HCC)    on CT  . SVT (supraventricular tachycardia) (HCC)    transient    No past surgical history on file.   Medications: Current Outpatient Prescriptions  Medication Sig Dispense Refill  . amLODipine (NORVASC) 5 MG tablet Take 1 tablet (5 mg total) by mouth daily. 30 tablet 9  . aspirin 81 MG tablet Take 162 mg by mouth daily.     [name] atorvastatin (LIPITOR) 20 MG tablet Take 1 tablet (20 mg total) by mouth daily. 90 tablet 1  . carvedilol (COREG) 12.5 MG tablet TAKE 1 TABLET BY MOUTH TWICE A DAY 60 tablet 11  . cloNIDine (CATAPRES) 0.1 MG tablet Take 0.1 mg by mouth 2 (two) times daily.    . divalproex (DEPAKOTE ER) 500 MG 24 hr tablet Take 2 tablets (1,000 mg total) by mouth at bedtime. 60 tablet 11  . donepezil (ARICEPT) 10 MG tablet  Take 1 tablet (10 mg total) by mouth at bedtime. 30 tablet 11  . lactulose, encephalopathy, (CHRONULAC) 10 GM/15ML SOLN TAKE 15 MLS BY MOUTH DAILY FOR BOWELS. INCREASE TO 30 IF CONSTIPATED 240 mL 1  . levothyroxine (SYNTHROID, LEVOTHROID) 25 MCG tablet Take 25 mcg by mouth daily before breakfast.    . nitroGLYCERIN (NITROSTAT) 0.4 MG SL tablet Place 0.4 mg under the tongue every 5 (five) minutes as needed for chest pain (3 DOSES MAX).    [name]. sertraline (ZOLOFT) 25 MG tablet Take 25 mg by mouth daily.    . valsartan (DIOVAN) 160 MG tablet Take 1 tablet (160 mg total) by mouth 2 (two) times daily. 60 tablet 11  . Vitamin D, Ergocalciferol, (DRISDOL) 50000 units CAPS capsule TAKE ONE CAPSULE BY MOUTH WEEKLY 30 capsule 0   No current facility-administered  medications for this visit.     Allergies: No Known Allergies  Social History: The patient  reports that she quit smoking about 65 years ago. Her smoking use included Cigarettes. She has never used smokeless tobacco. She reports that she does not drink alcohol or use drugs.   Family History: The patient's family history includes COPD in her father; Hypertension in her mother.   Review of Systems: Please see the history of present illness.   Otherwise, the review of systems is positive for none.   All other systems are reviewed and negative.   Physical Exam: VS:  BP (!) 100/50   Pulse (!) 56   Ht 5\' 1"  (1.549 m)   Wt 110 lb 12.8 oz (50.3 kg)   BMI 20.94 kg/m  .  BMI Body mass index is 20.94 kg/m.  Wt Readings from Last 3 Encounters:  10/24/15 110 lb 12.8 oz (50.3 kg)  10/12/15 114 lb 3.2 oz (51.8 kg)  09/20/15 111 lb 12.8 oz (50.7 kg)    General: Pleasant. Elderly female. She is alert and in no acute distress.   HEENT: Normal.  Neck: Supple, no JVD, carotid bruits, or masses noted.  Cardiac: Regular rate and rhythm. No murmurs, rubs, or gallops. No edema. Legs are fleshy Respiratory:  Lungs are clear to auscultation bilaterally with normal work of breathing.  GI: Soft and nontender.  MS: No deformity or atrophy. Gait and ROM intact.  Skin: Warm and dry. Color is normal.  Neuro:  Strength and sensation are intact and no gross focal deficits noted.  Psych: Alert, appropriate and with normal affect.   LABORATORY DATA:  EKG:  EKG is not ordered today.   Lab Results  Component Value Date   WBC 7.7 10/12/2015   HGB 10.4 (L) 10/12/2015   HCT 30.1 (L) 10/12/2015   PLT 223.0 10/12/2015   GLUCOSE 110 (H) 10/04/2015   CHOL 109 (L) 09/12/2015   TRIG 67 09/12/2015   HDL 83 09/12/2015   LDLDIRECT 36.0 03/22/2009   LDLCALC 13 09/12/2015   ALT 13 09/12/2015   AST 17 09/12/2015   NA 137 10/04/2015   K 4.1 10/04/2015   CL 104 10/04/2015   CREATININE 1.40 (H) 10/04/2015    BUN 32 (H) 10/04/2015   CO2 26 10/04/2015   TSH 2.732 02/14/2015   INR 1.0 06/25/2008   HGBA1C  06/25/2008    5.1 (NOTE) The ADA recommends the following therapeutic goal for glycemic control related to Hgb A1c measurement: Goal of therapy: <6.5 Hgb A1c  Reference: American Diabetes Association: Clinical Practice Recommendations 2010, Diabetes Care, 2010, 33: (Suppl  1).    BNP (  last 3 results) No results for input(s): BNP in the last 8760 hours.  ProBNP (last 3 results) No results for input(s): PROBNP in the last 8760 hours.   Other Studies Reviewed Today:  Study Highlights   Mild sinus bradycardia in 50s, occasional PACs.  No cause for syncope noted.      Assessment/Plan: 1. Syncope - most likely seizure related. Event monitor stable  2. Dementia - probably her most pressing issue. Need to focus on safety.   3. Diastolic HF - managed medically. Seems compensated.   4. HTN - BP little soft - not symptomatic - son is willing to monitor and let us know if we need to cut medicines back.   5.CAD - no active chest pain - would manage conservatively.   Current medicines are reviewed with the patient today.  The patient does not have concerns regarding medicines other than what has been noted above.  The following changes have been made:  See above.  Labs/ tests ordered today include:   No orders of the defined types were placed in this encounter.    Disposition:   Son would like to follow with me going forward. FU with me in 3 months.   Patient is agreeable to this plan and will call if any problems develop in the interim.   Signed: Rosalio Macadamia, RN, ANP-C 10/24/2015 11:21 AM  [facility] [address] [place], [place]  [address] Phone: [phone] Fax: [phone]

## 2015-10-26 ENCOUNTER — Telehealth: Payer: Self-pay | Admitting: Neurology

## 2015-10-26 DIAGNOSIS — Z8673 Personal history of transient ischemic attack (TIA), and cerebral infarction without residual deficits: Secondary | ICD-10-CM | POA: Diagnosis not present

## 2015-10-26 DIAGNOSIS — R2681 Unsteadiness on feet: Secondary | ICD-10-CM | POA: Diagnosis not present

## 2015-10-26 DIAGNOSIS — F0390 Unspecified dementia without behavioral disturbance: Secondary | ICD-10-CM | POA: Diagnosis not present

## 2015-10-26 DIAGNOSIS — I5032 Chronic diastolic (congestive) heart failure: Secondary | ICD-10-CM | POA: Diagnosis not present

## 2015-10-26 DIAGNOSIS — R4701 Aphasia: Secondary | ICD-10-CM | POA: Diagnosis not present

## 2015-10-26 DIAGNOSIS — I110 Hypertensive heart disease with heart failure: Secondary | ICD-10-CM | POA: Diagnosis not present

## 2015-10-26 DIAGNOSIS — D649 Anemia, unspecified: Secondary | ICD-10-CM | POA: Diagnosis not present

## 2015-10-26 DIAGNOSIS — G902 Horner's syndrome: Secondary | ICD-10-CM | POA: Diagnosis not present

## 2015-10-26 DIAGNOSIS — G40209 Localization-related (focal) (partial) symptomatic epilepsy and epileptic syndromes with complex partial seizures, not intractable, without status epilepticus: Secondary | ICD-10-CM | POA: Diagnosis not present

## 2015-10-26 DIAGNOSIS — E039 Hypothyroidism, unspecified: Secondary | ICD-10-CM | POA: Diagnosis not present

## 2015-10-26 DIAGNOSIS — I2510 Atherosclerotic heart disease of native coronary artery without angina pectoris: Secondary | ICD-10-CM | POA: Diagnosis not present

## 2015-10-26 DIAGNOSIS — Z7982 Long term (current) use of aspirin: Secondary | ICD-10-CM | POA: Diagnosis not present

## 2015-10-26 NOTE — Telephone Encounter (Signed)
[name] to get verbal orders for the patient to have PT 2 times a week for the next 5 weeks for gait, balance training, safe transfers, home exercise program and safety precautions.

## 2015-10-26 NOTE — Telephone Encounter (Signed)
Returned call to [place] - pt will continue PT - she will fax over orders for signature.

## 2015-10-28 ENCOUNTER — Telehealth: Payer: Self-pay | Admitting: Neurology

## 2015-10-28 NOTE — Telephone Encounter (Signed)
I have talked with her son, she has no recurrent seizure, taking Depakote ER 500 mg 2 tablets every night, complains of excessive morning drowsiness, I have advised him to cut back Depakote to 1 tablet every night keep follow-up appointment January 25 2016.

## 2015-10-28 NOTE — Telephone Encounter (Signed)
Pt's son, [name], called in about divalproex (DEPAKOTE ER) 500 MG 24 hr tablet that his mother is taking. He is concerned with the dosage and says she is walking zombie come morning time. Can it be reduced? She can not keep to her schedule. Please call and advise [phone]

## 2015-11-02 DIAGNOSIS — I5032 Chronic diastolic (congestive) heart failure: Secondary | ICD-10-CM | POA: Diagnosis not present

## 2015-11-02 DIAGNOSIS — I110 Hypertensive heart disease with heart failure: Secondary | ICD-10-CM | POA: Diagnosis not present

## 2015-11-02 DIAGNOSIS — F0390 Unspecified dementia without behavioral disturbance: Secondary | ICD-10-CM | POA: Diagnosis not present

## 2015-11-02 DIAGNOSIS — G40209 Localization-related (focal) (partial) symptomatic epilepsy and epileptic syndromes with complex partial seizures, not intractable, without status epilepticus: Secondary | ICD-10-CM | POA: Diagnosis not present

## 2015-11-02 DIAGNOSIS — R4701 Aphasia: Secondary | ICD-10-CM | POA: Diagnosis not present

## 2015-11-02 DIAGNOSIS — R2681 Unsteadiness on feet: Secondary | ICD-10-CM | POA: Diagnosis not present

## 2015-11-04 DIAGNOSIS — R2681 Unsteadiness on feet: Secondary | ICD-10-CM | POA: Diagnosis not present

## 2015-11-04 DIAGNOSIS — I110 Hypertensive heart disease with heart failure: Secondary | ICD-10-CM | POA: Diagnosis not present

## 2015-11-04 DIAGNOSIS — R4701 Aphasia: Secondary | ICD-10-CM | POA: Diagnosis not present

## 2015-11-04 DIAGNOSIS — G40209 Localization-related (focal) (partial) symptomatic epilepsy and epileptic syndromes with complex partial seizures, not intractable, without status epilepticus: Secondary | ICD-10-CM | POA: Diagnosis not present

## 2015-11-04 DIAGNOSIS — F0390 Unspecified dementia without behavioral disturbance: Secondary | ICD-10-CM | POA: Diagnosis not present

## 2015-11-04 DIAGNOSIS — I5032 Chronic diastolic (congestive) heart failure: Secondary | ICD-10-CM | POA: Diagnosis not present

## 2015-11-08 ENCOUNTER — Inpatient Hospital Stay (HOSPITAL_COMMUNITY): Admission: RE | Admit: 2015-11-08 | Payer: Medicare Other | Source: Ambulatory Visit

## 2015-11-08 ENCOUNTER — Encounter (HOSPITAL_COMMUNITY): Payer: Medicare Other

## 2015-11-09 DIAGNOSIS — R4701 Aphasia: Secondary | ICD-10-CM | POA: Diagnosis not present

## 2015-11-09 DIAGNOSIS — I110 Hypertensive heart disease with heart failure: Secondary | ICD-10-CM | POA: Diagnosis not present

## 2015-11-09 DIAGNOSIS — G40209 Localization-related (focal) (partial) symptomatic epilepsy and epileptic syndromes with complex partial seizures, not intractable, without status epilepticus: Secondary | ICD-10-CM | POA: Diagnosis not present

## 2015-11-09 DIAGNOSIS — R2681 Unsteadiness on feet: Secondary | ICD-10-CM | POA: Diagnosis not present

## 2015-11-09 DIAGNOSIS — F0390 Unspecified dementia without behavioral disturbance: Secondary | ICD-10-CM | POA: Diagnosis not present

## 2015-11-09 DIAGNOSIS — I5032 Chronic diastolic (congestive) heart failure: Secondary | ICD-10-CM | POA: Diagnosis not present

## 2015-11-11 DIAGNOSIS — I110 Hypertensive heart disease with heart failure: Secondary | ICD-10-CM | POA: Diagnosis not present

## 2015-11-11 DIAGNOSIS — F0390 Unspecified dementia without behavioral disturbance: Secondary | ICD-10-CM | POA: Diagnosis not present

## 2015-11-11 DIAGNOSIS — I5032 Chronic diastolic (congestive) heart failure: Secondary | ICD-10-CM | POA: Diagnosis not present

## 2015-11-11 DIAGNOSIS — G40209 Localization-related (focal) (partial) symptomatic epilepsy and epileptic syndromes with complex partial seizures, not intractable, without status epilepticus: Secondary | ICD-10-CM | POA: Diagnosis not present

## 2015-11-11 DIAGNOSIS — R2681 Unsteadiness on feet: Secondary | ICD-10-CM | POA: Diagnosis not present

## 2015-11-11 DIAGNOSIS — R4701 Aphasia: Secondary | ICD-10-CM | POA: Diagnosis not present

## 2015-11-15 ENCOUNTER — Ambulatory Visit (HOSPITAL_COMMUNITY)
Admission: RE | Admit: 2015-11-15 | Discharge: 2015-11-15 | Disposition: A | Discharge status: Home / Self Care | Payer: Medicare Other | Source: Ambulatory Visit | Attending: Cardiovascular Disease | Admitting: Cardiovascular Disease

## 2015-11-15 ENCOUNTER — Encounter (HOSPITAL_COMMUNITY): Payer: Medicare Other

## 2015-11-15 DIAGNOSIS — E785 Hyperlipidemia, unspecified: Secondary | ICD-10-CM | POA: Diagnosis not present

## 2015-11-15 DIAGNOSIS — N289 Disorder of kidney and ureter, unspecified: Secondary | ICD-10-CM | POA: Insufficient documentation

## 2015-11-15 DIAGNOSIS — Z8673 Personal history of transient ischemic attack (TIA), and cerebral infarction without residual deficits: Secondary | ICD-10-CM | POA: Insufficient documentation

## 2015-11-15 DIAGNOSIS — Z87891 Personal history of nicotine dependence: Secondary | ICD-10-CM | POA: Diagnosis not present

## 2015-11-15 DIAGNOSIS — I2510 Atherosclerotic heart disease of native coronary artery without angina pectoris: Secondary | ICD-10-CM | POA: Insufficient documentation

## 2015-11-15 DIAGNOSIS — I10 Essential (primary) hypertension: Secondary | ICD-10-CM | POA: Insufficient documentation

## 2015-11-15 DIAGNOSIS — I6523 Occlusion and stenosis of bilateral carotid arteries: Secondary | ICD-10-CM | POA: Insufficient documentation

## 2015-11-16 ENCOUNTER — Ambulatory Visit (INDEPENDENT_AMBULATORY_CARE_PROVIDER_SITE_OTHER): Payer: Medicare Other | Admitting: Adult Health

## 2015-11-16 ENCOUNTER — Ambulatory Visit: Payer: Medicare Other | Admitting: Nurse Practitioner

## 2015-11-16 ENCOUNTER — Encounter: Payer: Self-pay | Admitting: Adult Health

## 2015-11-16 ENCOUNTER — Other Ambulatory Visit: Payer: Self-pay | Admitting: Adult Health

## 2015-11-16 VITALS — BP 124/60 | Temp 98.6°F | Ht 59.0 in | Wt 111.2 lb

## 2015-11-16 DIAGNOSIS — M546 Pain in thoracic spine: Secondary | ICD-10-CM | POA: Diagnosis not present

## 2015-11-16 DIAGNOSIS — I6523 Occlusion and stenosis of bilateral carotid arteries: Secondary | ICD-10-CM | POA: Diagnosis not present

## 2015-11-16 NOTE — Patient Instructions (Signed)
It was great seeing you again.   It appears as though you bruised a muscle in your back and maybe the bone. I am not concerned that you have a broken bone.   It is ok to use Aleve and a heating pad. You can also use a SalonPas patch when you are not using the heating pad.

## 2015-11-16 NOTE — Telephone Encounter (Signed)
Ok to refill for 90 +1 

## 2015-11-16 NOTE — Progress Notes (Signed)
Subjective:    Patient ID: [name], female    DOB: 11/12/1920, 82 y.o.   MRN: [id number]  HPI  82 year old female who  has a past medical history of CHF (congestive heart failure) (HCC); Coronary artery disease; Dementia; Expressive aphasia syndrome; Frequent falls; Horner's syndrome; Hypertension; Hypothyroidism; Lacunar stroke (HCC); and SVT (supraventricular tachycardia) (HCC). She presents to the office today with her son for right sided mid back pain that occurred after a mechanical fall. Per patient she was using her walker at home when one of the legs of the walker got caught on a chair. This caused her to stumble and she hit her back on the knob of a kitchen drawer. She denies hitting her head, having LOC, or becoming dizzy.   Her son has had her use a heating pad which she endorses helps with the discomfort.   The pain is a sharp pain and is more apparent with deep breaths and certain movements.   Her son endorses that [name] has been up walking around without difficulty since the accident   Denies an bruising to the area    Review of Systems  Constitutional: Negative.   Respiratory: Negative.   Cardiovascular: Negative.   Musculoskeletal: Positive for arthralgias, back pain and gait problem. Negative for joint swelling and myalgias.  Skin: Negative.   Psychiatric/Behavioral: Positive for decreased concentration.  All other systems reviewed and are negative.  Past Medical History:  Diagnosis Date  . CHF (congestive heart failure) (HCC)    Diastolic CHF. Echo (3/10) EF 45-50% with periapical akinesis. Mild LVH. Mild MR. Pseudonormal diastolic function. Echo (4/10): EF 65% with mild focal basal septal hypertrophy. Pseudonormal diastolic function. Normal wall motion. Moderate biatrial enlargement. PASP 61 mmHg. Echo (4/11): EF 55%, inferobasal hypokinesis, mild MR, PA systolic pressure 44 mmHg.  [name]. Coronary artery disease    NSTEMI 3/10. LHC showed 80% mLAD, 90% mCFX. She had  PCI with Endeavor 2.5 x 12 to LAD and Endeavor 3.0 x 12 to CFX  . Dementia   . Expressive aphasia syndrome    More likely atypical migraine than TIA. Carotid dopplers (8/09) with 40-60% LICA stenosis.  . Frequent falls   . Horner's syndrome    left  . Hypertension   . Hypothyroidism   . Lacunar stroke (HCC)    on CT  . SVT (supraventricular tachycardia) (HCC)    transient    Social History   Social History  . Marital status: Widowed    Spouse name: N/A  . Number of children: N/A  . Years of education: N/A   Occupational History  . Not on file.   Social History Main Topics  . Smoking status: Former Smoker    Types: Cigarettes    Quit date: 05/25/1950  . Smokeless tobacco: Never Used  . Alcohol use No  . Drug use: No  . Sexual activity: Yes   Other Topics Concern  . Not on file   Social History Narrative  . No narrative on file    No past surgical history on file.  Family History  Problem Relation Age of Onset  . Hypertension Mother   . COPD Father   . Heart attack Neg Hx     No Known Allergies  Current Outpatient Prescriptions on File Prior to Visit  Medication Sig Dispense Refill  . amLODipine (NORVASC) 5 MG tablet Take 1 tablet (5 mg total) by mouth daily. 30 tablet 9  . aspirin 81 MG tablet  Take 162 mg by mouth daily.     [name] atorvastatin (LIPITOR) 20 MG tablet Take 1 tablet (20 mg total) by mouth daily. 90 tablet 1  . carvedilol (COREG) 12.5 MG tablet TAKE 1 TABLET BY MOUTH TWICE A DAY 60 tablet 11  . cloNIDine (CATAPRES) 0.1 MG tablet Take 0.1 mg by mouth 2 (two) times daily.    . divalproex (DEPAKOTE ER) 500 MG 24 hr tablet Take 2 tablets (1,000 mg total) by mouth at bedtime. 60 tablet 11  . donepezil (ARICEPT) 10 MG tablet Take 1 tablet (10 mg total) by mouth at bedtime. 30 tablet 11  . lactulose, encephalopathy, (CHRONULAC) 10 GM/15ML SOLN TAKE 15 MLS BY MOUTH DAILY FOR BOWELS. INCREASE TO 30 IF CONSTIPATED 240 mL 1  . levothyroxine (SYNTHROID,  LEVOTHROID) 25 MCG tablet Take 25 mcg by mouth daily before breakfast.    . nitroGLYCERIN (NITROSTAT) 0.4 MG SL tablet Place 0.4 mg under the tongue every 5 (five) minutes as needed for chest pain (3 DOSES MAX).    [name] sertraline (ZOLOFT) 25 MG tablet Take 25 mg by mouth daily.    . valsartan (DIOVAN) 160 MG tablet Take 1 tablet (160 mg total) by mouth 2 (two) times daily. 60 tablet 11   No current facility-administered medications on file prior to visit.     BP 124/60   Temp 98.6 F (37 C) (Oral)   Ht 4\' 11"  (1.499 m)   Wt 111 lb 3.2 oz (50.4 kg)   BMI 22.46 kg/m       Objective:   Physical Exam  Constitutional: She is oriented to person, place, and time. She appears well-developed and well-nourished. No distress.  Cardiovascular: Normal rate, normal heart sounds and intact distal pulses.  Exam reveals no gallop and no friction rub.   No murmur heard. Pulmonary/Chest: Effort normal and breath sounds normal. No respiratory distress. She has no wheezes. She has no rales. She exhibits no tenderness.  Musculoskeletal: Normal range of motion. She exhibits tenderness (tednerness with palpaiton to right mid back. No crepitus, bruising or deformity noted). She exhibits no edema or deformity.  Walks with a rolling walker - has slow steady gait.   Neurological: She is alert and oriented to person, place, and time.  Skin: Skin is warm and dry. No rash noted. She is not diaphoretic. No erythema. No pallor.  Psychiatric: She has a normal mood and affect. Her behavior is normal. Judgment and thought content normal.  Nursing note and vitals reviewed.     Assessment & Plan:  1. Right-sided thoracic back pain - Likely bruised muscle. Doubt fracture.  - Ok to use heating pad - Can use Aleve throughout the day  - Follow up if no improvement  Shirline Frees, NP

## 2015-11-17 DIAGNOSIS — H2513 Age-related nuclear cataract, bilateral: Secondary | ICD-10-CM | POA: Diagnosis not present

## 2015-11-17 DIAGNOSIS — H04123 Dry eye syndrome of bilateral lacrimal glands: Secondary | ICD-10-CM | POA: Diagnosis not present

## 2015-11-18 ENCOUNTER — Inpatient Hospital Stay (HOSPITAL_COMMUNITY): Admission: RE | Admit: 2015-11-18 | Payer: Medicare Other | Source: Ambulatory Visit

## 2015-11-22 ENCOUNTER — Encounter: Payer: Self-pay | Admitting: *Deleted

## 2015-11-23 ENCOUNTER — Other Ambulatory Visit: Payer: Self-pay | Admitting: Adult Health

## 2015-11-23 DIAGNOSIS — R2681 Unsteadiness on feet: Secondary | ICD-10-CM | POA: Diagnosis not present

## 2015-11-23 DIAGNOSIS — G40209 Localization-related (focal) (partial) symptomatic epilepsy and epileptic syndromes with complex partial seizures, not intractable, without status epilepticus: Secondary | ICD-10-CM | POA: Diagnosis not present

## 2015-11-23 DIAGNOSIS — I110 Hypertensive heart disease with heart failure: Secondary | ICD-10-CM | POA: Diagnosis not present

## 2015-11-23 DIAGNOSIS — I5032 Chronic diastolic (congestive) heart failure: Secondary | ICD-10-CM | POA: Diagnosis not present

## 2015-11-23 DIAGNOSIS — F0390 Unspecified dementia without behavioral disturbance: Secondary | ICD-10-CM | POA: Diagnosis not present

## 2015-11-23 DIAGNOSIS — R4701 Aphasia: Secondary | ICD-10-CM | POA: Diagnosis not present

## 2015-11-25 DIAGNOSIS — G40209 Localization-related (focal) (partial) symptomatic epilepsy and epileptic syndromes with complex partial seizures, not intractable, without status epilepticus: Secondary | ICD-10-CM | POA: Diagnosis not present

## 2015-11-25 DIAGNOSIS — I110 Hypertensive heart disease with heart failure: Secondary | ICD-10-CM | POA: Diagnosis not present

## 2015-11-25 DIAGNOSIS — F0390 Unspecified dementia without behavioral disturbance: Secondary | ICD-10-CM | POA: Diagnosis not present

## 2015-11-25 DIAGNOSIS — I5032 Chronic diastolic (congestive) heart failure: Secondary | ICD-10-CM | POA: Diagnosis not present

## 2015-11-25 DIAGNOSIS — R2681 Unsteadiness on feet: Secondary | ICD-10-CM | POA: Diagnosis not present

## 2015-11-25 DIAGNOSIS — R4701 Aphasia: Secondary | ICD-10-CM | POA: Diagnosis not present

## 2015-11-28 ENCOUNTER — Other Ambulatory Visit: Payer: Self-pay | Admitting: Adult Health

## 2015-11-28 DIAGNOSIS — Z1231 Encounter for screening mammogram for malignant neoplasm of breast: Secondary | ICD-10-CM

## 2015-11-30 DIAGNOSIS — F0390 Unspecified dementia without behavioral disturbance: Secondary | ICD-10-CM | POA: Diagnosis not present

## 2015-11-30 DIAGNOSIS — I5032 Chronic diastolic (congestive) heart failure: Secondary | ICD-10-CM | POA: Diagnosis not present

## 2015-11-30 DIAGNOSIS — R2681 Unsteadiness on feet: Secondary | ICD-10-CM | POA: Diagnosis not present

## 2015-11-30 DIAGNOSIS — G40209 Localization-related (focal) (partial) symptomatic epilepsy and epileptic syndromes with complex partial seizures, not intractable, without status epilepticus: Secondary | ICD-10-CM | POA: Diagnosis not present

## 2015-11-30 DIAGNOSIS — R4701 Aphasia: Secondary | ICD-10-CM | POA: Diagnosis not present

## 2015-11-30 DIAGNOSIS — I110 Hypertensive heart disease with heart failure: Secondary | ICD-10-CM | POA: Diagnosis not present

## 2015-12-02 DIAGNOSIS — I110 Hypertensive heart disease with heart failure: Secondary | ICD-10-CM | POA: Diagnosis not present

## 2015-12-02 DIAGNOSIS — R4701 Aphasia: Secondary | ICD-10-CM | POA: Diagnosis not present

## 2015-12-02 DIAGNOSIS — G40209 Localization-related (focal) (partial) symptomatic epilepsy and epileptic syndromes with complex partial seizures, not intractable, without status epilepticus: Secondary | ICD-10-CM | POA: Diagnosis not present

## 2015-12-02 DIAGNOSIS — R2681 Unsteadiness on feet: Secondary | ICD-10-CM | POA: Diagnosis not present

## 2015-12-02 DIAGNOSIS — F0390 Unspecified dementia without behavioral disturbance: Secondary | ICD-10-CM | POA: Diagnosis not present

## 2015-12-02 DIAGNOSIS — I5032 Chronic diastolic (congestive) heart failure: Secondary | ICD-10-CM | POA: Diagnosis not present

## 2015-12-07 DIAGNOSIS — F0390 Unspecified dementia without behavioral disturbance: Secondary | ICD-10-CM | POA: Diagnosis not present

## 2015-12-07 DIAGNOSIS — R2681 Unsteadiness on feet: Secondary | ICD-10-CM | POA: Diagnosis not present

## 2015-12-07 DIAGNOSIS — I5032 Chronic diastolic (congestive) heart failure: Secondary | ICD-10-CM | POA: Diagnosis not present

## 2015-12-07 DIAGNOSIS — I110 Hypertensive heart disease with heart failure: Secondary | ICD-10-CM | POA: Diagnosis not present

## 2015-12-07 DIAGNOSIS — G40209 Localization-related (focal) (partial) symptomatic epilepsy and epileptic syndromes with complex partial seizures, not intractable, without status epilepticus: Secondary | ICD-10-CM | POA: Diagnosis not present

## 2015-12-07 DIAGNOSIS — R4701 Aphasia: Secondary | ICD-10-CM | POA: Diagnosis not present

## 2015-12-08 ENCOUNTER — Telehealth: Payer: Self-pay | Admitting: Adult Health

## 2015-12-08 DIAGNOSIS — I6523 Occlusion and stenosis of bilateral carotid arteries: Secondary | ICD-10-CM

## 2015-12-08 DIAGNOSIS — N289 Disorder of kidney and ureter, unspecified: Secondary | ICD-10-CM

## 2015-12-08 DIAGNOSIS — I10 Essential (primary) hypertension: Secondary | ICD-10-CM

## 2015-12-08 MED ORDER — AMLODIPINE BESYLATE 5 MG PO TABS: ORAL_TABLET | ORAL | 9 refills | Status: DC

## 2015-12-08 NOTE — Telephone Encounter (Signed)
Patient and PT has been notified. Rx has been changed.

## 2015-12-08 NOTE — Telephone Encounter (Signed)
Pt son said Physical therapist is concern before pt exercising her  bp is too low and son richard would like nurse to [facility] [phone] to get reading and [facility].

## 2015-12-08 NOTE — Telephone Encounter (Signed)
Ok to cut Amlodipine in half

## 2015-12-08 NOTE — Telephone Encounter (Signed)
I contacted [name] -  They state there was no visit note from today, but her BP reading from yesterday was 100/60. [facility] [employer]

## 2015-12-09 DIAGNOSIS — R4701 Aphasia: Secondary | ICD-10-CM | POA: Diagnosis not present

## 2015-12-09 DIAGNOSIS — G40209 Localization-related (focal) (partial) symptomatic epilepsy and epileptic syndromes with complex partial seizures, not intractable, without status epilepticus: Secondary | ICD-10-CM | POA: Diagnosis not present

## 2015-12-09 DIAGNOSIS — F0390 Unspecified dementia without behavioral disturbance: Secondary | ICD-10-CM | POA: Diagnosis not present

## 2015-12-09 DIAGNOSIS — I5032 Chronic diastolic (congestive) heart failure: Secondary | ICD-10-CM | POA: Diagnosis not present

## 2015-12-09 DIAGNOSIS — I110 Hypertensive heart disease with heart failure: Secondary | ICD-10-CM | POA: Diagnosis not present

## 2015-12-09 DIAGNOSIS — R2681 Unsteadiness on feet: Secondary | ICD-10-CM | POA: Diagnosis not present

## 2015-12-14 DIAGNOSIS — F0390 Unspecified dementia without behavioral disturbance: Secondary | ICD-10-CM | POA: Diagnosis not present

## 2015-12-14 DIAGNOSIS — G40209 Localization-related (focal) (partial) symptomatic epilepsy and epileptic syndromes with complex partial seizures, not intractable, without status epilepticus: Secondary | ICD-10-CM | POA: Diagnosis not present

## 2015-12-14 DIAGNOSIS — R4701 Aphasia: Secondary | ICD-10-CM | POA: Diagnosis not present

## 2015-12-14 DIAGNOSIS — R2681 Unsteadiness on feet: Secondary | ICD-10-CM | POA: Diagnosis not present

## 2015-12-14 DIAGNOSIS — I5032 Chronic diastolic (congestive) heart failure: Secondary | ICD-10-CM | POA: Diagnosis not present

## 2015-12-14 DIAGNOSIS — I110 Hypertensive heart disease with heart failure: Secondary | ICD-10-CM | POA: Diagnosis not present

## 2015-12-16 ENCOUNTER — Telehealth: Payer: Self-pay | Admitting: Nurse Practitioner

## 2015-12-16 NOTE — Telephone Encounter (Signed)
Pt's son called request appt with Dr Jeannie FendY on Monday- he said the pt had a seizure this morning. I advised him she could see Eber Jonesarolyn on Monday. He accepted - appt is 10/23/@ 1:15.

## 2015-12-16 NOTE — Telephone Encounter (Signed)
Pt's son called back said he is going to increase divalproex (DEPAKOTE ER) 500 MG 24 hr tablet to 1-1/2 tab/day over the weekend and see how she tolerates it.  He said this is the 2nd seizure and it took her longer to come out of this one.  He said the 1st one was a few weeks ago. Please call

## 2015-12-16 NOTE — Telephone Encounter (Signed)
I have called her son, patient had a few episode of passing out, this happened after she taking a shower, she is on 3 blood pressure agent, there was no blood pressure, maximum systolic 100, amlodipine was decreased from 5 to 2.5 milligrams daily, she is also on Diovan 160 mg twice a day, clonidine 0.1 milligrams twice a day,  Differentiation diagnosis of her reported episode of confusion, staring including hypoperfusion of the brain from low blood pressure, versus complex partial seizure  She could not tolerate. Higher dose of Depakote 500 mg 2 tablets every night to make her drowsy, could not function during the day, I have advised her continue Depakote ER 500 mg once every night, document her blood pressure daily,  also advised her son to document her blood pressure and pulse rate during the episode  Has follow-up appointment December 19 2015

## 2015-12-17 DIAGNOSIS — R031 Nonspecific low blood-pressure reading: Secondary | ICD-10-CM | POA: Diagnosis not present

## 2015-12-19 ENCOUNTER — Telehealth: Payer: Self-pay | Admitting: Adult Health

## 2015-12-19 ENCOUNTER — Ambulatory Visit (INDEPENDENT_AMBULATORY_CARE_PROVIDER_SITE_OTHER): Payer: Medicare Other | Admitting: Nurse Practitioner

## 2015-12-19 ENCOUNTER — Telehealth: Payer: Self-pay | Admitting: Nurse Practitioner

## 2015-12-19 ENCOUNTER — Encounter: Payer: Self-pay | Admitting: *Deleted

## 2015-12-19 ENCOUNTER — Encounter: Payer: Self-pay | Admitting: Nurse Practitioner

## 2015-12-19 VITALS — BP 105/49 | HR 55 | Wt 110.4 lb

## 2015-12-19 DIAGNOSIS — I951 Orthostatic hypotension: Secondary | ICD-10-CM

## 2015-12-19 DIAGNOSIS — G40109 Localization-related (focal) (partial) symptomatic epilepsy and epileptic syndromes with simple partial seizures, not intractable, without status epilepticus: Secondary | ICD-10-CM | POA: Diagnosis not present

## 2015-12-19 DIAGNOSIS — R413 Other amnesia: Secondary | ICD-10-CM

## 2015-12-19 DIAGNOSIS — R269 Unspecified abnormalities of gait and mobility: Secondary | ICD-10-CM

## 2015-12-19 NOTE — Telephone Encounter (Signed)
Call, spoke with pt's son, [name] [facility]). Son stated his mother had episode on 12/17/15 where pt stared blankly and then came out of it. [name]. EMS were called. BP 84/39 HR 53, BP 93/35, HR 53. EMS stated pt may be over medicated. Son stated pt has been "wiped out" for the past 3 weeks after taking her weekly shower. Son stated pt stands during shower. Shower does have a seat. I encouraged son to have pt seated as much as possible, and to change positions slowly, and with assistance. Son stated pt has lost weight since her husband dies. Son stated her weight is 110 lbs. Pt has appt today with Neurologist, Dr. Terrace ArabiaYan. Will forward to [name], NP to advise.

## 2015-12-19 NOTE — Telephone Encounter (Signed)
S/w son will hold pm coreg and will not give morning dose of medications till talk with [name], NP in the am. To advise.  Son stated pt was doing better today pressure was 105 but when pt stands dropped to 90.  Stated forcing pt to eat. Son will wait for further instructions in the am.  Karsten FellsKaty Kemp, RN, helped triage call.

## 2015-12-19 NOTE — Progress Notes (Signed)
[facility]  PATIENT: [name] DOB: 11/20/1920   REASON FOR VISIT: Follow-up for memory loss, gait abnormality seizure disorder HISTORY FROM: Patient and son [name]    HISTORY OF PRESENT ILLNESS: [name]  is a 82  -year-old right-handed Caucasian female follow-up for seizure, and cognitive impairment   On June 25, 2008, she presented to the ER with complex partial seizure. At that time she reportedly had a few years history of intermittent episode of difficulty talking that might last about 30 minutes, but never experienced total loss of consciousness. However on April 30, she developed episode of difficulty talking again, followed by head deviation to the right side, whole-body tonic-clonic movements.  The events lasted a few minutes and then she had post-ictal confusion.  She had 3 similar recurrent episodes in 2 hours, followed by transient mild right-sided paralysis.  A MRI of the brain  revealed mild chronic small vessel disease but no acute lesions. She most likely has complex partial seizure stemming from the left frontal region.  She was started on Keppra 250 b.i.d and has no recurrent episodes. Because of her mild right side paralysis she was admitted to a nursing facility for a few months but recovered well and is now back at home. She lives alone with family support.   She is dong well, lives alone, independent in her daily activity, her son check on her regularly.  She also had a gradual onset memory trouble  UPDATE Feb 5th 2014.YY She is doing very well, no recurrent seizures, lives alone at her house, son checks on her regularly, she is taking keppra 250mg  bid. she enjoys reading, relies on a cane.  UPDATE May 22nd 2015:YY She has no recurrent seizure, gradual onset mild memory trouble, getting much worse during the most recent UTI, is on donepezil 5 mg every night, complains of nightmare  UPDATE June 14th 2016:YY She is with her son at  today's clinical visit, she had slow worsening memory trouble, was started on Aricept 5 mg daily by her primary care few months ago, which did help her, she complains of vivid dreams, no recurrent seizures.   UPDATE Feb 07 2015:YY She has her granddaughter CNA to take care of her at home, Her son reported that she gets agitated easily, she still enjoys reading, has daily routine, she eats meal regularly, sleeps well, increased gait difficulty, using a cane, had a risk to fall.  UPDATE October 19 2015:YY Son reported 3 episodes of confusion since June 2017, each episode is similar, last one was October 19 2015, her son heard her hit the floor, lying on her side, confused, staring into space, last a few minutes  She called her son on August 9th 2017 with skull abrasion, personally reviewed CT of head without contrast, Generalized atrophy, periventricular small vessel disease, no acute abnormalities. CT of cervical spine, no acute fracture, multiple level degenerative disc disease  Laboratory evaluation showed hemoglobin 10 point 4, BMP showed creatinine 1.4, hemoglobin was 11 point 6 in July 2017 UPDATE 10/23/2017CM [name], 82 year old female returns for follow-up with her son [name]. She had called in to the office on Friday, October 20 when the son reported she had a few episodes of passing out after taking a shower. He thought this was seizure activity and he was going to increase her Depakote over the weekend one and half tablets and see how she tolerated it He reported that there was no blood pressure and that  amlodipine was decreased from 5 mg to 2.5 mg she is also on Valsartan  160 twice a day and clonidine 0.1 twice a day. The next day on Saturday EMS was called due to passing out episode. Blood pressure was found to be 84/39 with heart rate of 53. The temperature in her house was 78 and she could've had some dehydration as well. Son forced fluids and made her eat more regularly on Saturday and  Sunday and she was much better by Sunday evening. She is not nearly as drowsy and confused.. She remains on Depakote 500 daily. She returns for reevaluation REVIEW OF SYSTEMS: Full 14 system review of systems performed and notable only for those listed, all others are neg:  Constitutional: neg  Cardiovascular: neg Ear/Nose/Throat: neg  Skin: neg Eyes: neg Respiratory: neg Gastroitestinal: neg  Hematology/Lymphatic: neg  Endocrine: Intolerance to cold Musculoskeletal:neg Allergy/Immunology: neg Neurological: Mild memory loss Psychiatric: neg Sleep : neg   ALLERGIES: No Known Allergies  HOME MEDICATIONS: Outpatient Medications Prior to Visit  Medication Sig Dispense Refill  . amLODipine (NORVASC) 5 MG tablet Take 1/2 tablet daily. 30 tablet 9  . aspirin 81 MG tablet Take 162 mg by mouth daily.     [name] atorvastatin (LIPITOR) 20 MG tablet Take 1 tablet (20 mg total) by mouth daily. 90 tablet 1  . carvedilol (COREG) 12.5 MG tablet TAKE 1 TABLET BY MOUTH TWICE A DAY 60 tablet 11  . cloNIDine (CATAPRES) 0.1 MG tablet Take 0.1 mg by mouth 2 (two) times daily.    . divalproex (DEPAKOTE ER) 500 MG 24 hr tablet Take 2 tablets (1,000 mg total) by mouth at bedtime. 60 tablet 11  . donepezil (ARICEPT) 10 MG tablet Take 1 tablet (10 mg total) by mouth at bedtime. 30 tablet 11  . lactulose, encephalopathy, (CHRONULAC) 10 GM/15ML SOLN TAKE 15 MLS BY MOUTH DAILY FOR BOWELS. INCREASE TO 30 IF CONSTIPATED 240 mL 1  . levothyroxine (SYNTHROID, LEVOTHROID) 25 MCG tablet Take 25 mcg by mouth daily before breakfast.    . nitroGLYCERIN (NITROSTAT) 0.4 MG SL tablet Place 0.4 mg under the tongue every 5 (five) minutes as needed for chest pain (3 DOSES MAX).    [name] sertraline (ZOLOFT) 25 MG tablet Take 25 mg by mouth daily.    . valsartan (DIOVAN) 160 MG tablet Take 1 tablet (160 mg total) by mouth 2 (two) times daily. 60 tablet 11  . Vitamin D, Ergocalciferol, (DRISDOL) 50000 units CAPS capsule TAKE ONE CAPSULE  BY MOUTH WEEKLY 90 capsule 1  . cloNIDine (CATAPRES) 0.1 MG tablet TAKE 1 TABLET BY MOUTH 3 TIMES A DAY 270 tablet 1   No facility-administered medications prior to visit.     PAST MEDICAL HISTORY: Past Medical History:  Diagnosis Date  . CHF (congestive heart failure) (HCC)    Diastolic CHF. Echo (3/10) EF 45-50% with periapical akinesis. Mild LVH. Mild MR. Pseudonormal diastolic function. Echo (4/10): EF 65% with mild focal basal septal hypertrophy. Pseudonormal diastolic function. Normal wall motion. Moderate biatrial enlargement. PASP 61 mmHg. Echo (4/11): EF 55%, inferobasal hypokinesis, mild MR, PA systolic pressure 44 mmHg.  [name] Coronary artery disease    NSTEMI 3/10. LHC showed 80% mLAD, 90% mCFX. She had PCI with Endeavor 2.5 x 12 to LAD and Endeavor 3.0 x 12 to CFX  . Dementia   . Expressive aphasia syndrome    More likely atypical migraine than TIA. Carotid dopplers (8/09) with 40-60% LICA stenosis.  . Frequent falls   .  Horner's syndrome    left  . Hypertension   . Hypothyroidism   . Lacunar stroke (HCC)    on CT  . SVT (supraventricular tachycardia) (HCC)    transient    PAST SURGICAL HISTORY: No past surgical history on file.  FAMILY HISTORY: Family History  Problem Relation Age of Onset  . Hypertension Mother   . COPD Father   . Heart attack Neg Hx     SOCIAL HISTORY: Social History   Social History  . Marital status: Widowed    Spouse name: N/A  . Number of children: N/A  . Years of education: N/A   Occupational History  . Not on file.   Social History Main Topics  . Smoking status: Former Smoker    Types: Cigarettes    Quit date: 05/25/1950  . Smokeless tobacco: Never Used  . Alcohol use No  . Drug use: No  . Sexual activity: Yes   Other Topics Concern  . Not on file   Social History Narrative  . No narrative on file     PHYSICAL EXAM  Vitals:   12/19/15 1305  BP: (!) 105/49 repeat B/P seated 100/50 standing 90/52  Pulse: (!) 55    Weight: 110 lb 6.4 oz (50.1 kg)   Body mass index is 22.3 kg/m.  Generalized: Well developed, in no acute distress ,Well-groomed Head: normocephalic and atraumatic,. Oropharynx benign  Neck: Supple, no carotid bruits  Cardiac: Regular rate rhythm, no murmur  Musculoskeletal: No deformity   Neurological examination   Mentation: Alert oriented to time, place, history taking. Attention span and concentration appropriate. Recent and remote memory intact.  Follows all commands speech and language fluent.   Cranial nerve II-XII: Fundoscopic exam not donePupils were equal round reactive to light extraocular movements were full, visual field were full on confrontational test. Facial sensation and strength were normal. hearing was intact to finger rubbing bilaterally. Uvula tongue midline. head turning and shoulder shrug were normal and symmetric.Tongue protrusion into cheek strength was normal. Motor: normal bulk and tone, full strength in the BUE, BLE, bilateral ankle dorsiflexion weakness noted .Sensory: Length dependent decreased light touch and pinprick Coordination: finger-nose-finger, heel-to-shin bilaterally, no dysmetria Reflexes: Brachioradialis 2/2, biceps 2/2, triceps 2/2, patellar 2/2, Achilles 2/2, plantar responses were flexor bilaterally. Gait and Station: In wheelchair not ambulated   DIAGNOSTIC DATA (LABS, IMAGING, TESTING) - I reviewed patient records, labs, notes, testing and imaging myself where available.  Lab Results  Component Value Date   WBC 7.7 10/12/2015   HGB 10.4 (L) 10/12/2015   HCT 30.1 (L) 10/12/2015   MCV 91.0 10/12/2015   PLT 223.0 10/12/2015      Component Value Date/Time   NA 137 10/04/2015 0008   K 4.1 10/04/2015 0008   CL 104 10/04/2015 0008   CO2 26 10/04/2015 0008   GLUCOSE 110 (H) 10/04/2015 0008   GLUCOSE 108 (H) 03/05/2006 0926   BUN 32 (H) 10/04/2015 0008   CREATININE 1.40 (H) 10/04/2015 0008   CREATININE 1.55 (H) 09/12/2015 1019    CALCIUM 9.3 10/04/2015 0008   PROT 6.5 09/12/2015 1019   ALBUMIN 3.6 09/12/2015 1019   AST 17 09/12/2015 1019   ALT 13 09/12/2015 1019   ALKPHOS 63 09/12/2015 1019   BILITOT 0.5 09/12/2015 1019   GFRNONAA 31 (L) 10/04/2015 0008   GFRAA 36 (L) 10/04/2015 0008   Lab Results  Component Value Date   CHOL 109 (L) 09/12/2015   HDL 83 09/12/2015   LDLCALC  13 09/12/2015   LDLDIRECT 36.0 03/22/2009   TRIG 67 09/12/2015   CHOLHDL 1.3 09/12/2015   Lab Results  Component Value Date   HGBA1C  06/25/2008    5.1 (NOTE) The ADA recommends the following therapeutic goal for glycemic control related to Hgb A1c measurement: Goal of therapy: <6.5 Hgb A1c  Reference: American Diabetes Association: Clinical Practice Recommendations 2010, Diabetes Care, 2010, 33: (Suppl  1).   Lab Results  Component Value Date   VITAMINB12 1,382 (H) 07/17/2013   Lab Results  Component Value Date   TSH 2.732 02/14/2015      ASSESSMENT AND PLAN  82 y.o. year old female  has a past medical history of Lacunar stroke (HCC); , mild cognitive impairment, complex partial seizure disorder and gait abnormality. On today's visit noted orthostasis upon standing and low BP  PLAN: Continue Depakote 500mg  daily Keep Aricept  10mg  daily for memory B/P seated 100/50 standing 90/52 follow up with cardiology Follow up as planned with Dr. Carmelina Noun, [facility], [facility], APRN  [facility] [address] [place], [place] [address] [phone]

## 2015-12-19 NOTE — Patient Instructions (Addendum)
Continue Depakote 500mg  daily Keep Aricept  10mg  daily for memory B/P seated 100/50 standing 90/52 Follow up as planned with Dr. Terrace ArabiaYan

## 2015-12-19 NOTE — Telephone Encounter (Signed)
ENT feel that the pt is being over medicated w/blood pressure medicines (amlodipine and clonidine).  ENT was called on Saturday and the Bp readings are  84/39  53     91/40/   63    93/35  53 and the reading was done by two different ENT trucks that was sent out.  Pt had two different episodes one on Friday and snapped back and Saturday was the only time that EMS was called out.

## 2015-12-19 NOTE — Telephone Encounter (Signed)
Pt's son calling regarding an episode she had 12-17-15, she has had episodes like this before, where she stares blankly out into space and doesn't speak or respond, this time lasted longer so her son called 911. Her BP was 84/39 PULSE 53, 91/40 PULSE 63, and 93-/35 PULSE 53, per EMT she may be over medicated, she has lost a lot of weight since being prescribed all her meds. Son requesting a review of meds and a call back  @ [phone] pt weighs about 110 lbs.

## 2015-12-19 NOTE — Telephone Encounter (Signed)
See below

## 2015-12-20 ENCOUNTER — Other Ambulatory Visit: Payer: Self-pay | Admitting: *Deleted

## 2015-12-20 MED ORDER — CARVEDILOL 6.25 MG PO TABS: 6.2500 mg | ORAL_TABLET | Freq: Two times a day (BID) | ORAL | 9 refills | Status: DC

## 2015-12-20 MED ORDER — CLONIDINE HCL 0.1 MG PO TABS: 0.1000 mg | ORAL_TABLET | Freq: Every day | ORAL | 9 refills | Status: DC

## 2015-12-20 NOTE — Telephone Encounter (Signed)
Sounds like she continues to have seizure activity  Would cut clonidine back to 0.1 mg at bedtime Would cut Coreg back to 6.25 mg BID  Needs to see her PCP

## 2015-12-20 NOTE — Telephone Encounter (Signed)
S/w son per DPR is agreeable to treatment plan.  Stated pt is doing much better today with holding Coreg last night Bp is 120/81. Stated already has phone call into PCP is frustrated with that office.  Will start coreg (6.25 mg ) bid and clonidine (0.1 mg) qhs. Sent in new script for Coreg and updated medication list. Will [place].

## 2015-12-21 ENCOUNTER — Telehealth: Payer: Self-pay | Admitting: Neurology

## 2015-12-21 NOTE — Telephone Encounter (Addendum)
Spoke to [place] - she will prepare orders, send them over for signing and arrange care for the patient.

## 2015-12-21 NOTE — Telephone Encounter (Signed)
[name] to get an order for skilled nursing, medication management, assessment & observation for the patient.

## 2015-12-21 NOTE — Telephone Encounter (Signed)
Spoke to her son [name]( Rick) and he informed me that cardiology has decreased Cored to 6.25 mg BID and Clonodine 1 tab per day.   She is no longer taking Norvasc  [name] is monitoring BP and reports that her blood pressures have been coming up. This morning 106/80

## 2015-12-23 DIAGNOSIS — R2681 Unsteadiness on feet: Secondary | ICD-10-CM | POA: Diagnosis not present

## 2015-12-23 DIAGNOSIS — G40209 Localization-related (focal) (partial) symptomatic epilepsy and epileptic syndromes with complex partial seizures, not intractable, without status epilepticus: Secondary | ICD-10-CM | POA: Diagnosis not present

## 2015-12-23 DIAGNOSIS — F0390 Unspecified dementia without behavioral disturbance: Secondary | ICD-10-CM | POA: Diagnosis not present

## 2015-12-23 DIAGNOSIS — R4701 Aphasia: Secondary | ICD-10-CM | POA: Diagnosis not present

## 2015-12-23 DIAGNOSIS — I110 Hypertensive heart disease with heart failure: Secondary | ICD-10-CM | POA: Diagnosis not present

## 2015-12-23 DIAGNOSIS — I5032 Chronic diastolic (congestive) heart failure: Secondary | ICD-10-CM | POA: Diagnosis not present

## 2015-12-25 DIAGNOSIS — F0390 Unspecified dementia without behavioral disturbance: Secondary | ICD-10-CM | POA: Diagnosis not present

## 2015-12-25 DIAGNOSIS — G40209 Localization-related (focal) (partial) symptomatic epilepsy and epileptic syndromes with complex partial seizures, not intractable, without status epilepticus: Secondary | ICD-10-CM | POA: Diagnosis not present

## 2015-12-25 DIAGNOSIS — G902 Horner's syndrome: Secondary | ICD-10-CM | POA: Diagnosis not present

## 2015-12-25 DIAGNOSIS — I110 Hypertensive heart disease with heart failure: Secondary | ICD-10-CM | POA: Diagnosis not present

## 2015-12-25 DIAGNOSIS — D649 Anemia, unspecified: Secondary | ICD-10-CM | POA: Diagnosis not present

## 2015-12-25 DIAGNOSIS — I5032 Chronic diastolic (congestive) heart failure: Secondary | ICD-10-CM | POA: Diagnosis not present

## 2015-12-25 DIAGNOSIS — I2510 Atherosclerotic heart disease of native coronary artery without angina pectoris: Secondary | ICD-10-CM | POA: Diagnosis not present

## 2015-12-25 DIAGNOSIS — Z8673 Personal history of transient ischemic attack (TIA), and cerebral infarction without residual deficits: Secondary | ICD-10-CM | POA: Diagnosis not present

## 2015-12-25 DIAGNOSIS — E039 Hypothyroidism, unspecified: Secondary | ICD-10-CM | POA: Diagnosis not present

## 2015-12-25 DIAGNOSIS — Z7982 Long term (current) use of aspirin: Secondary | ICD-10-CM | POA: Diagnosis not present

## 2015-12-25 DIAGNOSIS — R2681 Unsteadiness on feet: Secondary | ICD-10-CM | POA: Diagnosis not present

## 2015-12-26 NOTE — Progress Notes (Signed)
I have reviewed and agreed above plan. 

## 2015-12-27 ENCOUNTER — Ambulatory Visit (INDEPENDENT_AMBULATORY_CARE_PROVIDER_SITE_OTHER): Payer: Medicare Other | Admitting: Adult Health

## 2015-12-27 ENCOUNTER — Telehealth: Payer: Self-pay | Admitting: Neurology

## 2015-12-27 ENCOUNTER — Encounter: Payer: Self-pay | Admitting: Adult Health

## 2015-12-27 VITALS — BP 122/64 | Ht 59.0 in | Wt 112.0 lb

## 2015-12-27 DIAGNOSIS — I6523 Occlusion and stenosis of bilateral carotid arteries: Secondary | ICD-10-CM

## 2015-12-27 DIAGNOSIS — I959 Hypotension, unspecified: Secondary | ICD-10-CM | POA: Diagnosis not present

## 2015-12-27 DIAGNOSIS — H6123 Impacted cerumen, bilateral: Secondary | ICD-10-CM | POA: Diagnosis not present

## 2015-12-27 MED ORDER — AMLODIPINE BESYLATE 2.5 MG PO TABS
2.5000 mg | ORAL_TABLET | Freq: Every day | ORAL | 3 refills | Status: DC
Start: 2015-12-27 — End: 2016-01-25

## 2015-12-27 NOTE — Progress Notes (Signed)
Subjective:    Patient ID: [name], female    DOB: 06/30/1920, 82 y.o.   MRN: [id number]  HPI 82 year old female who presents to the office today for the acute complaint of bleeding from the left ear. Her son ( who is with her at this visit) believes that she may have stuck something in her ear and caused trauma. She has not been complaining of any pain and has not had any fevers. She denies any increased loss of hearing.   [name] reports that her blood pressure has been much better controlled. Today in the office she is 122/64. When we last spoke I misunderstood him and thought that she went off Norvasc completely. She is currently taking 2.5 mg daily.    Review of Systems  Constitutional: Negative.   HENT: Positive for ear discharge. Negative for ear pain, nosebleeds, postnasal drip, rhinorrhea and sinus pressure.   Eyes: Negative.   Respiratory: Negative.   Cardiovascular: Negative.   All other systems reviewed and are negative.  Past Medical History:  Diagnosis Date  . CHF (congestive heart failure) (HCC)    Diastolic CHF. Echo (3/10) EF 45-50% with periapical akinesis. Mild LVH. Mild MR. Pseudonormal diastolic function. Echo (4/10): EF 65% with mild focal basal septal hypertrophy. Pseudonormal diastolic function. Normal wall motion. Moderate biatrial enlargement. PASP 61 mmHg. Echo (4/11): EF 55%, inferobasal hypokinesis, mild MR, PA systolic pressure 44 mmHg.  [name]. Coronary artery disease    NSTEMI 3/10. LHC showed 80% mLAD, 90% mCFX. She had PCI with Endeavor 2.5 x 12 to LAD and Endeavor 3.0 x 12 to CFX  . Dementia   . Expressive aphasia syndrome    More likely atypical migraine than TIA. Carotid dopplers (8/09) with 40-60% LICA stenosis.  . Frequent falls   . Horner's syndrome    left  . Hypertension   . Hypothyroidism   . Lacunar stroke (HCC)    on CT  . SVT (supraventricular tachycardia) (HCC)    transient    Social History   Social History  . Marital status:  Widowed    Spouse name: N/A  . Number of children: N/A  . Years of education: N/A   Occupational History  . Not on file.   Social History Main Topics  . Smoking status: Former Smoker    Types: Cigarettes    Quit date: 05/25/1950  . Smokeless tobacco: Never Used  . Alcohol use No  . Drug use: No  . Sexual activity: Yes   Other Topics Concern  . Not on file   Social History Narrative  . No narrative on file    No past surgical history on file.  Family History  Problem Relation Age of Onset  . Hypertension Mother   . COPD Father   . Heart attack Neg Hx     No Known Allergies  Current Outpatient Prescriptions on File Prior to Visit  Medication Sig Dispense Refill  . aspirin 81 MG tablet Take 162 mg by mouth daily.     [name]. atorvastatin (LIPITOR) 20 MG tablet Take 1 tablet (20 mg total) by mouth daily. 90 tablet 1  . carvedilol (COREG) 6.25 MG tablet Take 1 tablet (6.25 mg total) by mouth 2 (two) times daily. 60 tablet 9  . cloNIDine (CATAPRES) 0.1 MG tablet Take 1 tablet (0.1 mg total) by mouth at bedtime. 30 tablet 9  . divalproex (DEPAKOTE ER) 500 MG 24 hr tablet Take 2 tablets (1,000 mg total) by  mouth at bedtime. 60 tablet 11  . donepezil (ARICEPT) 10 MG tablet Take 1 tablet (10 mg total) by mouth at bedtime. 30 tablet 11  . lactulose, encephalopathy, (CHRONULAC) 10 GM/15ML SOLN TAKE 15 MLS BY MOUTH DAILY FOR BOWELS. INCREASE TO 30 IF CONSTIPATED 240 mL 1  . levothyroxine (SYNTHROID, LEVOTHROID) 25 MCG tablet Take 25 mcg by mouth daily before breakfast.    . nitroGLYCERIN (NITROSTAT) 0.4 MG SL tablet Place 0.4 mg under the tongue every 5 (five) minutes as needed for chest pain (3 DOSES MAX).    [name]. sertraline (ZOLOFT) 25 MG tablet Take 25 mg by mouth daily.    . valsartan (DIOVAN) 160 MG tablet Take 1 tablet (160 mg total) by mouth 2 (two) times daily. 60 tablet 11  . Vitamin D, Ergocalciferol, (DRISDOL) 50000 units CAPS capsule TAKE ONE CAPSULE BY MOUTH WEEKLY 90 capsule 1    . [DISCONTINUED] amLODipine (NORVASC) 5 MG tablet Take 1/2 tablet daily. (Patient not taking: Reported on 12/27/2015) 30 tablet 9   No current facility-administered medications on file prior to visit.     BP 122/64   Ht 4\' 11"  (1.499 m)   Wt 112 lb (50.8 kg)   BMI 22.62 kg/m       Objective:   Physical Exam  Constitutional: She is oriented to person, place, and time. She appears well-developed and well-nourished. No distress.  HENT:  Head: Normocephalic and atraumatic.  Right Ear: External ear normal.  Left Ear: External ear normal.  Nose: Nose normal.  Mouth/Throat: Oropharynx is clear and moist. No oropharyngeal exudate.  TM not visualized in right ear due to cerumen impaction. Dried blood in left ear canal as well as cerumen impaction   Pulmonary/Chest: Effort normal and breath sounds normal.  Neurological: She is alert and oriented to person, place, and time.  Skin: Skin is warm and dry. No rash noted. She is not diaphoretic. No erythema. No pallor.  Psychiatric: She has a normal mood and affect. Her behavior is normal. Judgment and thought content normal.  Nursing note and vitals reviewed.     Assessment & Plan:  1. Bilateral impacted cerumen - Cerumen disimpacted with irrigation. No signs of infection in right ear. No signs of infection or trauma to left TM. She does have a small abrasion in left ear canal.  - Advised not to stick anything in her ear.   2. Hypotension, unspecified hypotension type - Better controlled.  - amLODipine (NORVASC) 2.5 MG tablet; Take 1 tablet (2.5 mg total) by mouth daily.  Dispense: 90 tablet; Refill: 3  Shirline Freesory Aalina Brege, NP

## 2015-12-27 NOTE — Telephone Encounter (Signed)
Pt's son, [name], called stating the [facility] today and is recommending extended therapy or visits. He wants to know if it will be approved? Please call and advise [phone], Gerlene Burdockichard

## 2015-12-27 NOTE — Telephone Encounter (Signed)
Returned call to son on HIPAA - he is aware that [place] [facility] called on 12/21/15, requesting additional services for his mothers medical needs, and they were approved.

## 2015-12-28 NOTE — Telephone Encounter (Signed)
Melissa/ [facility] called  Skilled nursing  2 week 2, PT  to resume this week.  [phone]- [phone]

## 2015-12-28 NOTE — Telephone Encounter (Signed)
Spoke to [place] [facility] - [facility] will continue patient's skilled nursing and PT.

## 2015-12-28 NOTE — Telephone Encounter (Signed)
Written orders received, signed by Dr. Terrace ArabiaYan and [facility].

## 2015-12-29 ENCOUNTER — Encounter: Payer: Self-pay | Admitting: Adult Health

## 2015-12-29 ENCOUNTER — Ambulatory Visit (INDEPENDENT_AMBULATORY_CARE_PROVIDER_SITE_OTHER): Payer: Medicare Other | Admitting: Adult Health

## 2015-12-29 VITALS — BP 124/64 | Temp 98.5°F

## 2015-12-29 DIAGNOSIS — N3000 Acute cystitis without hematuria: Secondary | ICD-10-CM

## 2015-12-29 DIAGNOSIS — I6523 Occlusion and stenosis of bilateral carotid arteries: Secondary | ICD-10-CM

## 2015-12-29 LAB — POCT URINALYSIS DIPSTICK
Bilirubin, UA: NEGATIVE
GLUCOSE UA: NEGATIVE
Nitrite, UA: NEGATIVE
SPEC GRAV UA: 1.025
UROBILINOGEN UA: NEGATIVE
pH, UA: 6

## 2015-12-29 MED ORDER — CEPHALEXIN 500 MG PO CAPS
500.0000 mg | ORAL_CAPSULE | Freq: Two times a day (BID) | ORAL | 0 refills | Status: DC
Start: 2015-12-29 — End: 2016-01-05

## 2015-12-29 NOTE — Progress Notes (Signed)
   Subjective:    Patient ID: [name], female    DOB: 09/02/1920, 82 y.o.   MRN: [id number]  HPI  82 year old female who returns to the office today with her son [name]. Her son [name] reports that over the last few days that his mother has appeared more confused and fatigued. [name] reports that his mother is awake at 1 am and is calling him to find out why the sun is not up yet. When he gets over to her house in the morning it is hard for him to get her out of bed.   She denies any symptoms of UTI but it is hard to get a reliable answer from her due to dementia.     Review of Systems  Unable to perform ROS: Dementia       Objective:   Physical Exam  Constitutional: She is oriented to person, place, and time. She appears well-developed and well-nourished. No distress.  Cardiovascular: Normal rate, regular rhythm, normal heart sounds and intact distal pulses.  Exam reveals no gallop and no friction rub.   No murmur heard. Pulmonary/Chest: Effort normal and breath sounds normal. No respiratory distress. She has no wheezes. She has no rales. She exhibits no tenderness.  Musculoskeletal: Normal range of motion. She exhibits no edema or tenderness.  Neurological: She is alert and oriented to person, place, and time.  Skin: Skin is warm and dry. No rash noted. She is not diaphoretic. No erythema. No pallor.  Psychiatric: She has a normal mood and affect. Her behavior is normal. Judgment and thought content normal.  Nursing note and vitals reviewed.     Assessment & Plan:  1. Acute cystitis without hematuria - POCT urinalysis dipstick + leuks.  - Unable to culture due to urination in hat. Due to symptoms will treat as UTI. Low suspicion for anything else.  - cephALEXin (KEFLEX) 500 MG capsule; Take 1 capsule (500 mg total) by mouth 2 (two) times daily.  Dispense: 10 capsule; Refill: 0 - Follow up if no improvement  Shirline Freesory Katelin Kutsch, NP

## 2015-12-30 DIAGNOSIS — I5032 Chronic diastolic (congestive) heart failure: Secondary | ICD-10-CM | POA: Diagnosis not present

## 2015-12-30 DIAGNOSIS — R2681 Unsteadiness on feet: Secondary | ICD-10-CM | POA: Diagnosis not present

## 2015-12-30 DIAGNOSIS — I110 Hypertensive heart disease with heart failure: Secondary | ICD-10-CM | POA: Diagnosis not present

## 2015-12-30 DIAGNOSIS — G40209 Localization-related (focal) (partial) symptomatic epilepsy and epileptic syndromes with complex partial seizures, not intractable, without status epilepticus: Secondary | ICD-10-CM | POA: Diagnosis not present

## 2015-12-30 DIAGNOSIS — D649 Anemia, unspecified: Secondary | ICD-10-CM | POA: Diagnosis not present

## 2015-12-30 DIAGNOSIS — F0390 Unspecified dementia without behavioral disturbance: Secondary | ICD-10-CM | POA: Diagnosis not present

## 2016-01-01 ENCOUNTER — Inpatient Hospital Stay (HOSPITAL_COMMUNITY)
Admission: EM | Admit: 2016-01-01 | Discharge: 2016-01-05 | DRG: 690 | Disposition: A | Discharge status: Skilled Nursing Facility | Payer: Medicare Other | Attending: Internal Medicine | Admitting: Internal Medicine

## 2016-01-01 ENCOUNTER — Emergency Department (HOSPITAL_COMMUNITY): Payer: Medicare Other

## 2016-01-01 ENCOUNTER — Encounter (HOSPITAL_COMMUNITY): Payer: Self-pay

## 2016-01-01 DIAGNOSIS — S299XXA Unspecified injury of thorax, initial encounter: Secondary | ICD-10-CM | POA: Diagnosis not present

## 2016-01-01 DIAGNOSIS — G902 Horner's syndrome: Secondary | ICD-10-CM | POA: Diagnosis not present

## 2016-01-01 DIAGNOSIS — R778 Other specified abnormalities of plasma proteins: Secondary | ICD-10-CM

## 2016-01-01 DIAGNOSIS — R2689 Other abnormalities of gait and mobility: Secondary | ICD-10-CM | POA: Diagnosis not present

## 2016-01-01 DIAGNOSIS — R413 Other amnesia: Secondary | ICD-10-CM | POA: Diagnosis present

## 2016-01-01 DIAGNOSIS — I110 Hypertensive heart disease with heart failure: Secondary | ICD-10-CM | POA: Diagnosis present

## 2016-01-01 DIAGNOSIS — R7989 Other specified abnormal findings of blood chemistry: Secondary | ICD-10-CM | POA: Diagnosis present

## 2016-01-01 DIAGNOSIS — R159 Full incontinence of feces: Secondary | ICD-10-CM | POA: Diagnosis present

## 2016-01-01 DIAGNOSIS — N390 Urinary tract infection, site not specified: Secondary | ICD-10-CM | POA: Diagnosis not present

## 2016-01-01 DIAGNOSIS — Z8249 Family history of ischemic heart disease and other diseases of the circulatory system: Secondary | ICD-10-CM

## 2016-01-01 DIAGNOSIS — R748 Abnormal levels of other serum enzymes: Secondary | ICD-10-CM

## 2016-01-01 DIAGNOSIS — Z7982 Long term (current) use of aspirin: Secondary | ICD-10-CM

## 2016-01-01 DIAGNOSIS — R269 Unspecified abnormalities of gait and mobility: Secondary | ICD-10-CM

## 2016-01-01 DIAGNOSIS — E039 Hypothyroidism, unspecified: Secondary | ICD-10-CM | POA: Diagnosis present

## 2016-01-01 DIAGNOSIS — Z8673 Personal history of transient ischemic attack (TIA), and cerebral infarction without residual deficits: Secondary | ICD-10-CM

## 2016-01-01 DIAGNOSIS — W19XXXA Unspecified fall, initial encounter: Secondary | ICD-10-CM

## 2016-01-01 DIAGNOSIS — Z66 Do not resuscitate: Secondary | ICD-10-CM | POA: Diagnosis present

## 2016-01-01 DIAGNOSIS — I252 Old myocardial infarction: Secondary | ICD-10-CM

## 2016-01-01 DIAGNOSIS — T1490XA Injury, unspecified, initial encounter: Secondary | ICD-10-CM | POA: Diagnosis not present

## 2016-01-01 DIAGNOSIS — Z825 Family history of asthma and other chronic lower respiratory diseases: Secondary | ICD-10-CM

## 2016-01-01 DIAGNOSIS — Z23 Encounter for immunization: Secondary | ICD-10-CM

## 2016-01-01 DIAGNOSIS — F0390 Unspecified dementia without behavioral disturbance: Secondary | ICD-10-CM | POA: Diagnosis not present

## 2016-01-01 DIAGNOSIS — Z87891 Personal history of nicotine dependence: Secondary | ICD-10-CM

## 2016-01-01 DIAGNOSIS — E782 Mixed hyperlipidemia: Secondary | ICD-10-CM | POA: Diagnosis present

## 2016-01-01 DIAGNOSIS — M6282 Rhabdomyolysis: Secondary | ICD-10-CM | POA: Diagnosis present

## 2016-01-01 DIAGNOSIS — I2510 Atherosclerotic heart disease of native coronary artery without angina pectoris: Secondary | ICD-10-CM | POA: Diagnosis present

## 2016-01-01 DIAGNOSIS — S4991XA Unspecified injury of right shoulder and upper arm, initial encounter: Secondary | ICD-10-CM | POA: Diagnosis not present

## 2016-01-01 DIAGNOSIS — I5032 Chronic diastolic (congestive) heart failure: Secondary | ICD-10-CM | POA: Diagnosis present

## 2016-01-01 DIAGNOSIS — R531 Weakness: Secondary | ICD-10-CM

## 2016-01-01 DIAGNOSIS — Y92009 Unspecified place in unspecified non-institutional (private) residence as the place of occurrence of the external cause: Secondary | ICD-10-CM

## 2016-01-01 DIAGNOSIS — T148XXA Other injury of unspecified body region, initial encounter: Secondary | ICD-10-CM | POA: Diagnosis not present

## 2016-01-01 DIAGNOSIS — S0990XA Unspecified injury of head, initial encounter: Secondary | ICD-10-CM | POA: Diagnosis not present

## 2016-01-01 DIAGNOSIS — N3000 Acute cystitis without hematuria: Secondary | ICD-10-CM

## 2016-01-01 LAB — COMPREHENSIVE METABOLIC PANEL
ALBUMIN: 3.2 g/dL — AB (ref 3.5–5.0)
ALK PHOS: 56 U/L (ref 38–126)
ALT: 36 U/L (ref 14–54)
AST: 58 U/L — AB (ref 15–41)
Anion gap: 9 (ref 5–15)
BILIRUBIN TOTAL: 0.9 mg/dL (ref 0.3–1.2)
BUN: 27 mg/dL — AB (ref 6–20)
CALCIUM: 9.3 mg/dL (ref 8.9–10.3)
CO2: 27 mmol/L (ref 22–32)
CREATININE: 1.15 mg/dL — AB (ref 0.44–1.00)
Chloride: 101 mmol/L (ref 101–111)
GFR calc Af Amer: 46 mL/min — ABNORMAL LOW (ref >60)
GFR calc non Af Amer: 40 mL/min — ABNORMAL LOW (ref >60)
GLUCOSE: 99 mg/dL (ref 65–99)
Potassium: 3.8 mmol/L (ref 3.5–5.1)
Sodium: 137 mmol/L (ref 135–145)
TOTAL PROTEIN: 6.7 g/dL (ref 6.5–8.1)

## 2016-01-01 LAB — CBC WITH DIFFERENTIAL/PLATELET
BASOS PCT: 0 %
Basophils Absolute: 0 10*3/uL (ref 0.0–0.1)
EOS ABS: 0.1 10*3/uL (ref 0.0–0.7)
EOS PCT: 0 %
HEMATOCRIT: 36.2 % (ref 36.0–46.0)
Hemoglobin: 12.2 g/dL (ref 12.0–15.0)
Lymphocytes Relative: 14 %
Lymphs Abs: 1.6 10*3/uL (ref 0.7–4.0)
MCH: 31.4 pg (ref 26.0–34.0)
MCHC: 33.7 g/dL (ref 30.0–36.0)
MCV: 93.3 fL (ref 78.0–100.0)
MONO ABS: 1.4 10*3/uL — AB (ref 0.1–1.0)
MONOS PCT: 12 %
Neutro Abs: 8.4 10*3/uL — ABNORMAL HIGH (ref 1.7–7.7)
Neutrophils Relative %: 74 %
Platelets: 164 10*3/uL (ref 150–400)
RBC: 3.88 MIL/uL (ref 3.87–5.11)
RDW: 16.3 % — AB (ref 11.5–15.5)
WBC: 11.5 10*3/uL — ABNORMAL HIGH (ref 4.0–10.5)

## 2016-01-01 LAB — TROPONIN I
TROPONIN I: 0.12 ng/mL — AB (ref <0.03)
TROPONIN I: 0.1 ng/mL — AB (ref <0.03)
Troponin I: 0.15 ng/mL (ref <0.03)

## 2016-01-01 LAB — VALPROIC ACID LEVEL: Valproic Acid Lvl: 54 ug/mL (ref 50.0–100.0)

## 2016-01-01 LAB — URINALYSIS, ROUTINE W REFLEX MICROSCOPIC
BILIRUBIN URINE: NEGATIVE
Glucose, UA: NEGATIVE mg/dL
KETONES UR: NEGATIVE mg/dL
NITRITE: NEGATIVE
PH: 7.5 (ref 5.0–8.0)
Protein, ur: 30 mg/dL — AB
Specific Gravity, Urine: 1.015 (ref 1.005–1.030)

## 2016-01-01 LAB — PROTIME-INR
INR: 0.99
Prothrombin Time: 13.1 seconds (ref 11.4–15.2)

## 2016-01-01 LAB — TSH: TSH: 4.261 u[IU]/mL (ref 0.350–4.500)

## 2016-01-01 LAB — URINE MICROSCOPIC-ADD ON

## 2016-01-01 LAB — CK: CK TOTAL: 658 U/L — AB (ref 38–234)

## 2016-01-01 MED ORDER — ONDANSETRON HCL 4 MG/2ML IJ SOLN: 4.0000 mg | Freq: Four times a day (QID) | INTRAMUSCULAR | Status: DC | PRN

## 2016-01-01 MED ORDER — ACETAMINOPHEN 325 MG PO TABS: 650.0000 mg | ORAL_TABLET | Freq: Four times a day (QID) | ORAL | Status: DC | PRN

## 2016-01-01 MED ORDER — HYDROCODONE-ACETAMINOPHEN 5-325 MG PO TABS: 1.0000 | ORAL_TABLET | ORAL | Status: DC | PRN

## 2016-01-01 MED ORDER — ACETAMINOPHEN 650 MG RE SUPP: 650.0000 mg | Freq: Four times a day (QID) | RECTAL | Status: DC | PRN

## 2016-01-01 MED ORDER — SODIUM CHLORIDE 0.9 % IV SOLN: INTRAVENOUS | Status: DC

## 2016-01-01 MED ORDER — HEPARIN SODIUM (PORCINE) 5000 UNIT/ML IJ SOLN
5000.0000 [IU] | Freq: Three times a day (TID) | INTRAMUSCULAR | Status: DC
  Administered 2016-01-01 – 2016-01-05 (×12): 5000 [IU] via SUBCUTANEOUS
  Filled 2016-01-01 (×11): qty 1

## 2016-01-01 MED ORDER — CARVEDILOL 6.25 MG PO TABS
6.2500 mg | ORAL_TABLET | Freq: Two times a day (BID) | ORAL | Status: DC
  Administered 2016-01-01 – 2016-01-04 (×7): 6.25 mg via ORAL
  Filled 2016-01-01 (×7): qty 1

## 2016-01-01 MED ORDER — DONEPEZIL HCL 5 MG PO TABS
10.0000 mg | ORAL_TABLET | Freq: Every day | ORAL | Status: DC
Start: 2016-01-01 — End: 2016-01-05
  Administered 2016-01-01 – 2016-01-04 (×4): 10 mg via ORAL
  Filled 2016-01-01 (×4): qty 2

## 2016-01-01 MED ORDER — SODIUM CHLORIDE 0.9 % IV SOLN
INTRAVENOUS | Status: DC
  Administered 2016-01-01 – 2016-01-02 (×2): via INTRAVENOUS
  Administered 2016-01-02: 1000 mL via INTRAVENOUS
  Administered 2016-01-04: 07:00:00 via INTRAVENOUS

## 2016-01-01 MED ORDER — NITROGLYCERIN 0.4 MG SL SUBL
0.4000 mg | SUBLINGUAL_TABLET | SUBLINGUAL | Status: DC | PRN
Start: 2016-01-01 — End: 2016-01-05

## 2016-01-01 MED ORDER — ONDANSETRON HCL 4 MG PO TABS: 4.0000 mg | ORAL_TABLET | Freq: Four times a day (QID) | ORAL | Status: DC | PRN

## 2016-01-01 MED ORDER — HALOPERIDOL LACTATE 5 MG/ML IJ SOLN
2.0000 mg | Freq: Four times a day (QID) | INTRAMUSCULAR | Status: DC | PRN
  Administered 2016-01-03: 2 mg via INTRAVENOUS
  Filled 2016-01-01: qty 1

## 2016-01-01 MED ORDER — ATORVASTATIN CALCIUM 10 MG PO TABS
20.0000 mg | ORAL_TABLET | Freq: Every day | ORAL | Status: DC
  Administered 2016-01-01 – 2016-01-04 (×4): 20 mg via ORAL
  Filled 2016-01-01 (×4): qty 2

## 2016-01-01 MED ORDER — DEXTROSE 5 % IV SOLN
1.0000 g | INTRAVENOUS | Status: DC
  Administered 2016-01-01 – 2016-01-04 (×4): 1 g via INTRAVENOUS
  Filled 2016-01-01 (×5): qty 10

## 2016-01-01 MED ORDER — SERTRALINE HCL 25 MG PO TABS
25.0000 mg | ORAL_TABLET | Freq: Every day | ORAL | Status: DC
  Administered 2016-01-01 – 2016-01-04 (×4): 25 mg via ORAL
  Filled 2016-01-01 (×4): qty 1

## 2016-01-01 MED ORDER — ASPIRIN EC 81 MG PO TBEC
81.0000 mg | DELAYED_RELEASE_TABLET | Freq: Every day | ORAL | Status: DC
  Administered 2016-01-01 – 2016-01-04 (×4): 81 mg via ORAL
  Filled 2016-01-01 (×4): qty 1

## 2016-01-01 MED ORDER — INFLUENZA VAC SPLIT QUAD 0.5 ML IM SUSY
0.5000 mL | PREFILLED_SYRINGE | INTRAMUSCULAR | Status: AC
  Administered 2016-01-02: 0.5 mL via INTRAMUSCULAR
  Filled 2016-01-01: qty 0.5

## 2016-01-01 MED ORDER — DIVALPROEX SODIUM ER 500 MG PO TB24
500.0000 mg | ORAL_TABLET | Freq: Every day | ORAL | Status: DC
Start: 2016-01-01 — End: 2016-01-05
  Administered 2016-01-01 – 2016-01-04 (×4): 500 mg via ORAL
  Filled 2016-01-01 (×4): qty 1

## 2016-01-01 NOTE — ED Triage Notes (Signed)
Her son phoned EMS r/t pt. Fall (she comes from her home). He further told us pt. Is ambulating with a more shuffling gait and seems to be "favoring her left leg". She arrives in no distress, having had a loose b.m., which we cleanse.

## 2016-01-01 NOTE — ED Provider Notes (Signed)
[facility]-EMERGENCY DEPT Provider Note   CSN: [id number] Arrival date & time: 01/01/16  3664     History   Chief Complaint Chief Complaint  Patient presents with  . Fall    HPI [name] is a 82 y.o. female.  HPI Patient's care for by her family in the home. They spent the day with her and then put her to bed in the evening and return in the morning. Patient's son reports that he put her to bed at 8:30 yesterday evening. This morning he found her lying in the floor close to her armchair. Patient does not recall what happened. Patient some believes she tried to get up and sit in her chair to reach her Bible. Patient is denying any area of active pain. Patient son reports that she was unable to walk to the ambulance. He states she has problems with weakness and dysfunction of the right side of her body. This seems to come and go. And is particularly pronounced when she tries to ambulate with dragging of the right extremity at times. He reports today she was unable to walk without dragging the limb. She has been treated for a urinary tract infection with Keflex. She still has 4 more days of treatment. He denies she's had recent fever, cough, vomiting or diarrhea. Past Medical History:  Diagnosis Date  . CHF (congestive heart failure) (HCC)    Diastolic CHF. Echo (3/10) EF 45-50% with periapical akinesis. Mild LVH. Mild MR. Pseudonormal diastolic function. Echo (4/10): EF 65% with mild focal basal septal hypertrophy. Pseudonormal diastolic function. Normal wall motion. Moderate biatrial enlargement. PASP 61 mmHg. Echo (4/11): EF 55%, inferobasal hypokinesis, mild MR, PA systolic pressure 44 mmHg.  [name] Coronary artery disease    NSTEMI 3/10. LHC showed 80% mLAD, 90% mCFX. She had PCI with Endeavor 2.5 x 12 to LAD and Endeavor 3.0 x 12 to CFX  . Dementia   . Expressive aphasia syndrome    More likely atypical migraine than TIA. Carotid dopplers (8/09) with 40-60% LICA stenosis.  . Frequent falls     . Horner's syndrome    left  . Hypertension   . Hypothyroidism   . Lacunar stroke (HCC)    on CT  . SVT (supraventricular tachycardia) (HCC)    transient    Patient Active Problem List   Diagnosis Date Noted  . Troponin I above reference range 01/01/2016  . Abnormality of gait 02/07/2015  . Memory loss 07/17/2013  . Seizure disorder, focal motor (HCC) 03/26/2012  . Carotid stenosis 06/26/2011  . Depression 05/10/2010  . HYPERLIPIDEMIA-MIXED 11/05/2008  . DIASTOLIC HEART FAILURE, CHRONIC 09/17/2008  . UNSPECIFIED VENOUS INSUFFICIENCY 06/09/2008  . CORONARY ATHEROSCLEROSIS NATIVE CORONARY ARTERY 05/12/2008  . Hypothyroidism 12/05/2007  . CONSTIPATION, DRUG INDUCED 09/02/2007  . BREAST CYST 09/02/2007  . UNS ADVRS EFF UNS RX MEDICINAL&BIOLOGICAL SBSTNC 06/04/2007  . Essential hypertension 07/25/2006  . OSTEOPOROSIS 07/25/2006  . CEREBROVASCULAR ACCIDENT, HX OF 07/25/2006    No past surgical history on file.  OB History    No data available       Home Medications    Prior to Admission medications   Medication Sig Start Date End Date Taking? Authorizing Provider  amLODipine (NORVASC) 2.5 MG tablet Take 1 tablet (2.5 mg total) by mouth daily. 12/27/15   Shirline Frees, NP  aspirin 81 MG tablet Take 162 mg by mouth daily.     Historical Provider, MD  atorvastatin (LIPITOR) 20 MG tablet Take 1 tablet (20  mg total) by mouth daily. 09/19/15   Laurey Morale, MD  carvedilol (COREG) 6.25 MG tablet Take 1 tablet (6.25 mg total) by mouth 2 (two) times daily. 12/20/15   Rosalio Macadamia, NP  cephALEXin (KEFLEX) 500 MG capsule Take 1 capsule (500 mg total) by mouth 2 (two) times daily. 12/29/15   Shirline Frees, NP  cloNIDine (CATAPRES) 0.1 MG tablet Take 1 tablet (0.1 mg total) by mouth at bedtime. 12/20/15   Rosalio Macadamia, NP  divalproex (DEPAKOTE ER) 500 MG 24 hr tablet Take 2 tablets (1,000 mg total) by mouth at bedtime. 10/19/15   Levert Feinstein, MD  donepezil (ARICEPT) 10 MG tablet  Take 1 tablet (10 mg total) by mouth at bedtime. 07/21/15   Levert Feinstein, MD  lactulose, encephalopathy, (CHRONULAC) 10 GM/15ML SOLN TAKE 15 MLS BY MOUTH DAILY FOR BOWELS. INCREASE TO 30 IF CONSTIPATED 03/07/15   Shirline Frees, NP  levothyroxine (SYNTHROID, LEVOTHROID) 25 MCG tablet Take 25 mcg by mouth daily before breakfast.    Historical Provider, MD  nitroGLYCERIN (NITROSTAT) 0.4 MG SL tablet Place 0.4 mg under the tongue every 5 (five) minutes as needed for chest pain (3 DOSES MAX).    Historical Provider, MD  sertraline (ZOLOFT) 25 MG tablet Take 25 mg by mouth daily.    Historical Provider, MD  valsartan (DIOVAN) 160 MG tablet Take 1 tablet (160 mg total) by mouth 2 (two) times daily. 10/24/15   Rosalio Macadamia, NP  Vitamin D, Ergocalciferol, (DRISDOL) 50000 units CAPS capsule TAKE ONE CAPSULE BY MOUTH WEEKLY 11/16/15   Shirline Frees, NP    Family History Family History  Problem Relation Age of Onset  . Hypertension Mother   . COPD Father   . Heart attack Neg Hx     Social History Social History  Substance Use Topics  . Smoking status: Former Smoker    Types: Cigarettes    Quit date: 05/25/1950  . Smokeless tobacco: Never Used  . Alcohol use No     Allergies   Patient has no known allergies.   Review of Systems Review of Systems 10 Systems reviewed and are negative for acute change except as noted in the HPI.  Physical Exam Updated Vital Signs BP 167/72 (BP Location: Left Arm)   Pulse 62   Temp 97.4 F (36.3 C) (Oral)   Resp 18   SpO2 96%   Physical Exam  Constitutional: She is oriented to person, place, and time. She appears well-developed and well-nourished.  Patient is alert. She has no respiratory distress. She is well conditioned for age.  HENT:  Head: Normocephalic and atraumatic.  Dried blood in lft auditory canal. No dental or oral injury  Eyes: EOM are normal. Pupils are equal, round, and reactive to light.  Neck: Neck supple.  No C-spine point tenderness    Cardiovascular: Normal rate, regular rhythm, normal heart sounds and intact distal pulses.   Pulmonary/Chest: Effort normal and breath sounds normal.  Abdominal: Soft. Bowel sounds are normal. She exhibits no distension. There is no tenderness. There is no guarding.  Musculoskeletal: Normal range of motion. She exhibits tenderness. She exhibits no deformity.  5 cm bruise posterior right shoulder. Multiple older bruises and senile eccymosis for arms and pretibial. I completed both lower extremities through range of motion flexing at the hip and the knee and have the patient push against me without causing pain.  and extend at the ankle appropriately without pain.  Neurological: She is alert and oriented  to person, place, and time. No cranial nerve deficit or sensory deficit. She exhibits normal muscle tone.  Skin: Skin is warm and dry. There is pallor.  Psychiatric: She has a normal mood and affect.     ED Treatments / Results  Labs (all labs ordered are listed, but only abnormal results are displayed) Labs Reviewed  COMPREHENSIVE METABOLIC PANEL - Abnormal; Notable for the following:       Result Value   BUN 27 (*)    Creatinine, Ser 1.15 (*)    Albumin 3.2 (*)    AST 58 (*)    GFR calc non Af Amer 40 (*)    GFR calc Af Amer 46 (*)    All other components within normal limits  TROPONIN I - Abnormal; Notable for the following:    Troponin I 0.15 (*)    All other components within normal limits  CBC WITH DIFFERENTIAL/PLATELET - Abnormal; Notable for the following:    WBC 11.5 (*)    RDW 16.3 (*)    Neutro Abs 8.4 (*)    Monocytes Absolute 1.4 (*)    All other components within normal limits  CK - Abnormal; Notable for the following:    Total CK 658 (*)    All other components within normal limits  VALPROIC ACID LEVEL  URINALYSIS, ROUTINE W REFLEX MICROSCOPIC (NOT AT [facility])  I-STAT CG4 LACTIC ACID, ED    EKG  EKG Interpretation None       Radiology Dg Shoulder  Right  Result Date: 01/01/2016 CLINICAL DATA:  Fall. EXAM: RIGHT SHOULDER - 2+ VIEW COMPARISON:  None. FINDINGS: Visualized portion of the right hemithorax is normal. No acute fracture or dislocation. Degenerative irregularity of the rotator cuff insertion. Mild acromioclavicular joint degenerative change as well. IMPRESSION: No acute osseous abnormality. Electronically Signed   By: Jeronimo GreavesKyle  Talbot M.D.   On: 01/01/2016 10:38   Ct Head Wo Contrast  Result Date: 01/01/2016 CLINICAL DATA:  Fall, favoring LEFT leg, history hypertension, coronary artery disease, CHF, stroke, former smoker EXAM: CT HEAD WITHOUT CONTRAST TECHNIQUE: Contiguous axial images were obtained from the base of the skull through the vertex without intravenous contrast. COMPARISON:  10/05/2015 FINDINGS: Brain: Generalized atrophy. Normal ventricular morphology. No midline shift or mass effect. Small vessel chronic ischemic changes of deep cerebral white matter. No intracranial hemorrhage, mass lesion, evidence of acute infarction, or extra-axial fluid collection. Vascular: Atherosclerotic calcifications at the carotid siphons Skull: Intact Sinuses/Orbits: Opacified RIGHT maxillary sinus Other: N/A IMPRESSION: Atrophy with small vessel chronic ischemic changes of deep cerebral white matter. No acute intracranial abnormalities. Electronically Signed   By: Ulyses SouthwardMark  Boles M.D.   On: 01/01/2016 10:11   Dg Chest Port 1 View  Result Date: 01/01/2016 CLINICAL DATA:  Fall.  Mental status changes. EXAM: PORTABLE CHEST 1 VIEW COMPARISON:  06/25/2008 FINDINGS: Midline trachea. Normal heart size. Atherosclerosis in the transverse aorta. No pleural effusion or pneumothorax. Mild scarring at the left lung base laterally. IMPRESSION: No acute cardiopulmonary disease. Aortic atherosclerosis. Electronically Signed   By: Jeronimo GreavesKyle  Talbot M.D.   On: 01/01/2016 10:34    Procedures Procedures (including critical care time)  Medications Ordered in ED Medications   0.9 %  sodium chloride infusion (not administered)     Initial Impression / Assessment and Plan / ED Course  I have reviewed the triage vital signs and the nursing notes.  Pertinent labs & imaging results that were available during my care of the patient were reviewed by  me and considered in my medical decision making (see chart for details).  Clinical Course    Consult: [facility] hospitalist for admission.  Final Clinical Impressions(s) / ED Diagnoses   Final diagnoses:  Fall, initial encounter  Non-traumatic rhabdomyolysis  Troponin I above reference range  Neurologic gait dysfunction   Patient fell in her home. At this time, she is alert with intact mental status. CT head does not show intracranial injury. Troponin does have mild elevation as well as CK consistent with mild rhabdomyolysis. Patient placed in observation for rule out of MI and continued evaluation for gait dysfunction as reported by her family. Patient does have recent UTI as well. New Prescriptions New Prescriptions   No medications on file     Arby BarretteMarcy Hasel Janish, MD 01/01/16 1226

## 2016-01-01 NOTE — H&P (Signed)
History and Physical    [name]:[id number] DOB: October 20, 1920 DOA: 01/01/2016  PCP: Shirline Frees, NP  Patient coming from: Home  Chief Complaint: Generalized weakness  HPI: [name] is a 82 y.o. female with medical history significant of Descemet's CHF, CAD, dementia and advanced age. Patient came in to the hospital because of generalized weakness. Patient is demented all of the history obtained from her son at bedside and EDP notes. Patient apparently had generalized progressive weakness for the past several days, on 11/2 she was seen by her PCP, diagnosed with UTI and started on Keflex. Son takes her to the bed every night and she does not leave it until 7 in the morning, he found her on the floor covered with her urine and stools. Reported weakness to the point she can couldn't get up. They also mentioned she was leaning to her right side.  ED Course:  Vitals: WNL Labs: WNL, troponin 0.15 Imaging: CT of the head without acute findings Interventions: None  Review of Systems:  Demented, unable to provide ROS  Past Medical History:  Diagnosis Date  . CHF (congestive heart failure) (HCC)    Diastolic CHF. Echo (3/10) EF 45-50% with periapical akinesis. Mild LVH. Mild MR. Pseudonormal diastolic function. Echo (4/10): EF 65% with mild focal basal septal hypertrophy. Pseudonormal diastolic function. Normal wall motion. Moderate biatrial enlargement. PASP 61 mmHg. Echo (4/11): EF 55%, inferobasal hypokinesis, mild MR, PA systolic pressure 44 mmHg.  [name] Coronary artery disease    NSTEMI 3/10. LHC showed 80% mLAD, 90% mCFX. She had PCI with Endeavor 2.5 x 12 to LAD and Endeavor 3.0 x 12 to CFX  . Dementia   . Expressive aphasia syndrome    More likely atypical migraine than TIA. Carotid dopplers (8/09) with 40-60% LICA stenosis.  . Frequent falls   . Horner's syndrome    left  . Hypertension   . Hypothyroidism   . Lacunar stroke (HCC)    on CT  . SVT (supraventricular  tachycardia) (HCC)    transient    No past surgical history on file.   reports that she quit smoking about 65 years ago. Her smoking use included Cigarettes. She has never used smokeless tobacco. She reports that she does not drink alcohol or use drugs.  No Known Allergies  Family History  Problem Relation Age of Onset  . Hypertension Mother   . COPD Father   . Heart attack Neg Hx      Prior to Admission medications   Medication Sig Start Date End Date Taking? Authorizing Provider  amLODipine (NORVASC) 2.5 MG tablet Take 1 tablet (2.5 mg total) by mouth daily. 12/27/15  Yes Shirline Frees, NP  aspirin EC 81 MG tablet Take 162 mg by mouth daily.   Yes Historical Provider, MD  atorvastatin (LIPITOR) 20 MG tablet Take 1 tablet (20 mg total) by mouth daily. 09/19/15  Yes Laurey Morale, MD  carvedilol (COREG) 6.25 MG tablet Take 1 tablet (6.25 mg total) by mouth 2 (two) times daily. 12/20/15  Yes Rosalio Macadamia, NP  cephALEXin (KEFLEX) 500 MG capsule Take 1 capsule (500 mg total) by mouth 2 (two) times daily. 12/29/15  Yes Shirline Frees, NP  cloNIDine (CATAPRES) 0.1 MG tablet Take 1 tablet (0.1 mg total) by mouth at bedtime. 12/20/15  Yes Rosalio Macadamia, NP  divalproex (DEPAKOTE ER) 500 MG 24 hr tablet Take 2 tablets (1,000 mg total) by mouth at bedtime. Patient taking differently: Take 500  mg by mouth at bedtime.  10/19/15  Yes Levert Feinstein, MD  donepezil (ARICEPT) 10 MG tablet Take 1 tablet (10 mg total) by mouth at bedtime. 07/21/15  Yes Levert Feinstein, MD  nitroGLYCERIN (NITROSTAT) 0.4 MG SL tablet Place 0.4 mg under the tongue every 5 (five) minutes as needed for chest pain (3 DOSES MAX).   Yes Historical Provider, MD  sertraline (ZOLOFT) 25 MG tablet Take 25 mg by mouth daily.   Yes Historical Provider, MD  valsartan (DIOVAN) 160 MG tablet Take 1 tablet (160 mg total) by mouth 2 (two) times daily. 10/24/15  Yes Rosalio Macadamia, NP  Vitamin D, Ergocalciferol, (DRISDOL) 50000 units CAPS capsule  TAKE ONE CAPSULE BY MOUTH WEEKLY 11/16/15  Yes Shirline Frees, NP  lactulose, encephalopathy, (CHRONULAC) 10 GM/15ML SOLN TAKE 15 MLS BY MOUTH DAILY FOR BOWELS. INCREASE TO 30 IF CONSTIPATED Patient not taking: Reported on 01/01/2016 03/07/15   Shirline Frees, NP    Physical Exam:  Vitals:   01/01/16 0922 01/01/16 1041 01/01/16 1252  BP:  167/72 (!) 155/45  Pulse: 62 62 63  Resp: 18 18 20   Temp: 97.4 F (36.3 C)  97.6 F (36.4 C)  TempSrc: Oral  Oral  SpO2: 96% 96% 96%    Constitutional: NAD, calm, comfortable Eyes: PERRL, lids and conjunctivae normal ENMT: Mucous membranes are moist. Posterior pharynx clear of any exudate or lesions.Normal dentition.  Neck: normal, supple, no masses, no thyromegaly Respiratory: clear to auscultation bilaterally, no wheezing, no crackles. Normal respiratory effort. No accessory muscle use.  Cardiovascular: Regular rate and rhythm, no murmurs / rubs / gallops. No extremity edema. 2+ pedal pulses. No carotid bruits.  Abdomen: no tenderness, no masses palpated. No hepatosplenomegaly. Bowel sounds positive.  Musculoskeletal: no clubbing / cyanosis. No joint deformity upper and lower extremities. Good ROM, no contractures. Normal muscle tone.  Skin: no rashes, lesions, ulcers. No induration Neurologic: CN 2-12 grossly intact. Sensation intact, DTR normal. Strength 5/5 in all 4.  Psychiatric: Normal judgment and insight. Alert and oriented x 3. Normal mood.   Labs on Admission: I have personally reviewed following labs and imaging studies  CBC:  Recent Labs Lab 01/01/16 1026  WBC 11.5*  NEUTROABS 8.4*  HGB 12.2  HCT 36.2  MCV 93.3  PLT 164   Basic Metabolic Panel:  Recent Labs Lab 01/01/16 1026  NA 137  K 3.8  CL 101  CO2 27  GLUCOSE 99  BUN 27*  CREATININE 1.15*  CALCIUM 9.3   GFR: Estimated Creatinine Clearance: 20.8 mL/min (by C-G formula based on SCr of 1.15 mg/dL (H)). Liver Function Tests:  Recent Labs Lab 01/01/16 1026  AST  58*  ALT 36  ALKPHOS 56  BILITOT 0.9  PROT 6.7  ALBUMIN 3.2*   No results for input(s): LIPASE, AMYLASE in the last 168 hours. No results for input(s): AMMONIA in the last 168 hours. Coagulation Profile: No results for input(s): INR, PROTIME in the last 168 hours. Cardiac Enzymes:  Recent Labs Lab 01/01/16 1026  CKTOTAL 658*  TROPONINI 0.15*   BNP (last 3 results) No results for input(s): PROBNP in the last 8760 hours. HbA1C: No results for input(s): HGBA1C in the last 72 hours. CBG: No results for input(s): GLUCAP in the last 168 hours. Lipid Profile: No results for input(s): CHOL, HDL, LDLCALC, TRIG, CHOLHDL, LDLDIRECT in the last 72 hours. Thyroid Function Tests: No results for input(s): TSH, T4TOTAL, FREET4, T3FREE, THYROIDAB in the last 72 hours. Anemia Panel: No results  for input(s): VITAMINB12, FOLATE, FERRITIN, TIBC, IRON, RETICCTPCT in the last 72 hours. Urine analysis:    Component Value Date/Time   COLORURINE YELLOW 01/01/2016 0953   APPEARANCEUR CLEAR 01/01/2016 0953   LABSPEC 1.015 01/01/2016 0953   PHURINE 7.5 01/01/2016 0953   GLUCOSEU NEGATIVE 01/01/2016 0953   HGBUR TRACE (A) 01/01/2016 0953   BILIRUBINUR NEGATIVE 01/01/2016 0953   BILIRUBINUR Neg 12/29/2015 1523   KETONESUR NEGATIVE 01/01/2016 0953   PROTEINUR 30 (A) 01/01/2016 0953   UROBILINOGEN negative 12/29/2015 1523   UROBILINOGEN 0.2 04/27/2008 1300   NITRITE NEGATIVE 01/01/2016 0953   LEUKOCYTESUR TRACE (A) 01/01/2016 0953   Sepsis Labs: !!!!!!!!!!!!!!!!!!!!!!!!!!!!!!!!!!!!!!!!!!!! Invalid input(s): PROCALCITONIN, LACTICIDVEN No results found for this or any previous visit (from the past 240 hour(s)).   Radiological Exams on Admission: Dg Shoulder Right  Result Date: 01/01/2016 CLINICAL DATA:  Fall. EXAM: RIGHT SHOULDER - 2+ VIEW COMPARISON:  None. FINDINGS: Visualized portion of the right hemithorax is normal. No acute fracture or dislocation. Degenerative irregularity of the rotator  cuff insertion. Mild acromioclavicular joint degenerative change as well. IMPRESSION: No acute osseous abnormality. Electronically Signed   By: Jeronimo GreavesKyle  Talbot M.D.   On: 01/01/2016 10:38   Ct Head Wo Contrast  Result Date: 01/01/2016 CLINICAL DATA:  Fall, favoring LEFT leg, history hypertension, coronary artery disease, CHF, stroke, former smoker EXAM: CT HEAD WITHOUT CONTRAST TECHNIQUE: Contiguous axial images were obtained from the base of the skull through the vertex without intravenous contrast. COMPARISON:  10/05/2015 FINDINGS: Brain: Generalized atrophy. Normal ventricular morphology. No midline shift or mass effect. Small vessel chronic ischemic changes of deep cerebral white matter. No intracranial hemorrhage, mass lesion, evidence of acute infarction, or extra-axial fluid collection. Vascular: Atherosclerotic calcifications at the carotid siphons Skull: Intact Sinuses/Orbits: Opacified RIGHT maxillary sinus Other: N/A IMPRESSION: Atrophy with small vessel chronic ischemic changes of deep cerebral white matter. No acute intracranial abnormalities. Electronically Signed   By: Ulyses SouthwardMark  Boles M.D.   On: 01/01/2016 10:11   Dg Chest Port 1 View  Result Date: 01/01/2016 CLINICAL DATA:  Fall.  Mental status changes. EXAM: PORTABLE CHEST 1 VIEW COMPARISON:  06/25/2008 FINDINGS: Midline trachea. Normal heart size. Atherosclerosis in the transverse aorta. No pleural effusion or pneumothorax. Mild scarring at the left lung base laterally. IMPRESSION: No acute cardiopulmonary disease. Aortic atherosclerosis. Electronically Signed   By: Jeronimo GreavesKyle  Talbot M.D.   On: 01/01/2016 10:34    EKG: Independently reviewed.   Assessment/Plan Principal Problem:   UTI (urinary tract infection) Active Problems:   Hypothyroidism   Memory loss   Troponin I above reference range   General weakness    UTI -Recently diagnosed with UTI started on Keflex, urinalysis still shows multiple plus cells. -Started on Rocephin, urine  culture obtained, and his antibiotics according to culture results.  Generalized weakness -This is likely secondary to deconditioning from UTI. -PT/OT to evaluate and treat.  Elevated troponin -Troponin of 0.15, patient denies any chest pain, EKG without ischemic findings. -Patient is on aspirin and Coreg, continued. Follow trend of troponin, not candidate for aggressive interventions.  Questionable focal weakness -Per son she was leaning toward the right side, she is done this before. -She has history of stroke and she is on aspirin. -I discussed with her son at bedside that we will defer MRI, but there is no acute findings on CT. -The rationale was if the MRI shows that she needs anticoagulation she is not a candidate for it.  Hypothyroidism -Was on low-dose of levothyroxine  25 g, currently not taking it, check TSH.  Dementia -Was not able to provide any meaningful history, high risk to develop sundowning and acute delirium. -As needed Haldol at night for agitation.  DVT prophylaxis: SQ Heparin Code Status: DNR/DNI, discussed with son at bedside Family Communication: Plan D/W patient Disposition Plan: Home Consults called:  Admission status: Observation   [facility] FallsELMAHI,Jemila Camille A MD [facility] Pager [phone]  If 7PM-7AM, please contact night-coverage [email or web address] Password [email or web address]  01/01/2016, 1:23 PM

## 2016-01-01 NOTE — ED Notes (Signed)
Bed: [id number] Expected date:  Expected time:  Means of arrival:  Comments: 82 yo fall; no injury

## 2016-01-01 NOTE — ED Notes (Signed)
She remains in no distress and has been seen by the hospitalist (Dr. Arthor CaptainElmahi).

## 2016-01-02 ENCOUNTER — Ambulatory Visit: Payer: Medicare Other | Admitting: Nurse Practitioner

## 2016-01-02 DIAGNOSIS — I110 Hypertensive heart disease with heart failure: Secondary | ICD-10-CM | POA: Diagnosis present

## 2016-01-02 DIAGNOSIS — F0390 Unspecified dementia without behavioral disturbance: Secondary | ICD-10-CM | POA: Diagnosis not present

## 2016-01-02 DIAGNOSIS — Y92009 Unspecified place in unspecified non-institutional (private) residence as the place of occurrence of the external cause: Secondary | ICD-10-CM | POA: Diagnosis not present

## 2016-01-02 DIAGNOSIS — R41841 Cognitive communication deficit: Secondary | ICD-10-CM | POA: Diagnosis not present

## 2016-01-02 DIAGNOSIS — R269 Unspecified abnormalities of gait and mobility: Secondary | ICD-10-CM | POA: Diagnosis not present

## 2016-01-02 DIAGNOSIS — Z8249 Family history of ischemic heart disease and other diseases of the circulatory system: Secondary | ICD-10-CM | POA: Diagnosis not present

## 2016-01-02 DIAGNOSIS — R159 Full incontinence of feces: Secondary | ICD-10-CM | POA: Diagnosis present

## 2016-01-02 DIAGNOSIS — E039 Hypothyroidism, unspecified: Secondary | ICD-10-CM | POA: Diagnosis not present

## 2016-01-02 DIAGNOSIS — M6282 Rhabdomyolysis: Secondary | ICD-10-CM | POA: Diagnosis present

## 2016-01-02 DIAGNOSIS — M6281 Muscle weakness (generalized): Secondary | ICD-10-CM | POA: Diagnosis not present

## 2016-01-02 DIAGNOSIS — Z9181 History of falling: Secondary | ICD-10-CM | POA: Diagnosis not present

## 2016-01-02 DIAGNOSIS — Z66 Do not resuscitate: Secondary | ICD-10-CM | POA: Diagnosis present

## 2016-01-02 DIAGNOSIS — Z8673 Personal history of transient ischemic attack (TIA), and cerebral infarction without residual deficits: Secondary | ICD-10-CM | POA: Diagnosis not present

## 2016-01-02 DIAGNOSIS — R262 Difficulty in walking, not elsewhere classified: Secondary | ICD-10-CM | POA: Diagnosis not present

## 2016-01-02 DIAGNOSIS — E782 Mixed hyperlipidemia: Secondary | ICD-10-CM | POA: Diagnosis present

## 2016-01-02 DIAGNOSIS — Z825 Family history of asthma and other chronic lower respiratory diseases: Secondary | ICD-10-CM | POA: Diagnosis not present

## 2016-01-02 DIAGNOSIS — R531 Weakness: Secondary | ICD-10-CM | POA: Diagnosis not present

## 2016-01-02 DIAGNOSIS — I5032 Chronic diastolic (congestive) heart failure: Secondary | ICD-10-CM | POA: Diagnosis present

## 2016-01-02 DIAGNOSIS — Z7409 Other reduced mobility: Secondary | ICD-10-CM | POA: Diagnosis not present

## 2016-01-02 DIAGNOSIS — Z7982 Long term (current) use of aspirin: Secondary | ICD-10-CM | POA: Diagnosis not present

## 2016-01-02 DIAGNOSIS — N390 Urinary tract infection, site not specified: Secondary | ICD-10-CM | POA: Diagnosis present

## 2016-01-02 DIAGNOSIS — N3000 Acute cystitis without hematuria: Secondary | ICD-10-CM | POA: Diagnosis not present

## 2016-01-02 DIAGNOSIS — I252 Old myocardial infarction: Secondary | ICD-10-CM | POA: Diagnosis not present

## 2016-01-02 DIAGNOSIS — Z87891 Personal history of nicotine dependence: Secondary | ICD-10-CM | POA: Diagnosis not present

## 2016-01-02 DIAGNOSIS — Z23 Encounter for immunization: Secondary | ICD-10-CM | POA: Diagnosis not present

## 2016-01-02 DIAGNOSIS — R1312 Dysphagia, oropharyngeal phase: Secondary | ICD-10-CM | POA: Diagnosis not present

## 2016-01-02 DIAGNOSIS — G902 Horner's syndrome: Secondary | ICD-10-CM | POA: Diagnosis present

## 2016-01-02 DIAGNOSIS — I2510 Atherosclerotic heart disease of native coronary artery without angina pectoris: Secondary | ICD-10-CM | POA: Diagnosis present

## 2016-01-02 LAB — BASIC METABOLIC PANEL
Anion gap: 8 (ref 5–15)
BUN: 35 mg/dL — AB (ref 6–20)
CO2: 26 mmol/L (ref 22–32)
CREATININE: 1.34 mg/dL — AB (ref 0.44–1.00)
Calcium: 8.3 mg/dL — ABNORMAL LOW (ref 8.9–10.3)
Chloride: 102 mmol/L (ref 101–111)
GFR, EST AFRICAN AMERICAN: 38 mL/min — AB (ref >60)
GFR, EST NON AFRICAN AMERICAN: 33 mL/min — AB (ref >60)
Glucose, Bld: 94 mg/dL (ref 65–99)
POTASSIUM: 3 mmol/L — AB (ref 3.5–5.1)
SODIUM: 136 mmol/L (ref 135–145)

## 2016-01-02 LAB — CBC
HCT: 30.7 % — ABNORMAL LOW (ref 36.0–46.0)
Hemoglobin: 10.5 g/dL — ABNORMAL LOW (ref 12.0–15.0)
MCH: 32 pg (ref 26.0–34.0)
MCHC: 34.2 g/dL (ref 30.0–36.0)
MCV: 93.6 fL (ref 78.0–100.0)
PLATELETS: 141 10*3/uL — AB (ref 150–400)
RBC: 3.28 MIL/uL — AB (ref 3.87–5.11)
RDW: 16.5 % — AB (ref 11.5–15.5)
WBC: 8 10*3/uL (ref 4.0–10.5)

## 2016-01-02 LAB — CG4 I-STAT (LACTIC ACID): Lactic Acid, Venous: 1.53 mmol/L (ref 0.5–1.9)

## 2016-01-02 MED ORDER — POTASSIUM CHLORIDE CRYS ER 20 MEQ PO TBCR
40.0000 meq | EXTENDED_RELEASE_TABLET | ORAL | Status: AC
  Administered 2016-01-02 (×2): 40 meq via ORAL
  Filled 2016-01-02 (×3): qty 2

## 2016-01-02 MED ORDER — HALOPERIDOL LACTATE 5 MG/ML IJ SOLN
1.0000 mg | Freq: Once | INTRAMUSCULAR | Status: AC
  Administered 2016-01-02: 1 mg via INTRAVENOUS
  Filled 2016-01-02: qty 1

## 2016-01-02 MED ORDER — HYDRALAZINE HCL 20 MG/ML IJ SOLN
10.0000 mg | Freq: Once | INTRAMUSCULAR | Status: AC
  Administered 2016-01-02: 10 mg via INTRAVENOUS
  Filled 2016-01-02: qty 1

## 2016-01-02 NOTE — Evaluation (Signed)
Physical Therapy Evaluation Patient Details Name: [name] MRN: [id number] DOB: 05/01/1920 Today's Date: 01/02/2016   History of Present Illness  82 y.o. female admitted with fall, UTI, weakness. PMH CHF, dementia, falls, MI, Parkinson's.   Clinical Impression  Pt admitted with above diagnosis. Pt currently with functional limitations due to the deficits listed below (see PT Problem List). Pt ambulated 90' with min to mod A for balance, son reports this is a decline from her baseline. ST-SNF recommended.  Pt will benefit from skilled PT to increase their independence and safety with mobility to allow discharge to the venue listed below.       Follow Up Recommendations SNF;Supervision/Assistance - 24 hour    Equipment Recommendations  None recommended by PT    Recommendations for Other Services       Precautions / Restrictions Precautions Precautions: Fall Precaution Comments: h/o 3-4 falls in past 1 year per pt's son Restrictions Weight Bearing Restrictions: No      Mobility  Bed Mobility Overal bed mobility: Needs Assistance Bed Mobility: Supine to Sit     Supine to sit: Mod assist     General bed mobility comments: mod A to raise trunk and pivot hips with pad to EOB  Transfers Overall transfer level: Needs assistance Equipment used: Rolling walker (2 wheeled) Transfers: Sit to/from Stand Sit to Stand: Mod assist         General transfer comment: mod A to rise  Ambulation/Gait Ambulation/Gait assistance: Min assist;Mod assist Ambulation Distance (Feet): 90 Feet Assistive device: Rolling walker (2 wheeled) Gait Pattern/deviations: Step-to pattern;Decreased step length - right;Decreased step length - left;Trunk flexed   Gait velocity interpretation: at or above normal speed for age/gender General Gait Details: mod A x 1 for LOB, mod A to maneuver RW with turns, min A for balance , VCs to lift head  Stairs            Wheelchair Mobility     Modified Rankin (Stroke Patients Only)       Balance Overall balance assessment: History of Falls;Needs assistance   Sitting balance-Leahy Scale: Good       Standing balance-Leahy Scale: Poor                               Pertinent Vitals/Pain Pain Assessment: No/denies pain    Home Living Family/patient expects to be discharged to:: Private residence Living Arrangements: Children Available Help at Discharge: Family;Available 24 hours/day Type of Home: House Home Access: Stairs to enter   Entergy CorporationEntrance Stairs-Number of Steps: 2 Home Layout: One level Home Equipment: Shower seat - built in;Walker - 2 wheels;Bedside commode;Cane - quad      Prior Function Level of Independence: Needs assistance   Gait / Transfers Assistance Needed: walked with RW or quad cane with stand by assist  ADL's / Homemaking Assistance Needed: increased time to bathe and dress but could do so independently        Hand Dominance        Extremity/Trunk Assessment   Upper Extremity Assessment: Overall WFL for tasks assessed           Lower Extremity Assessment: Overall WFL for tasks assessed      Cervical / Trunk Assessment: Kyphotic  Communication   Communication: HOH  Cognition Arousal/Alertness: Awake/alert Behavior During Therapy: WFL for tasks assessed/performed Overall Cognitive Status: Impaired/Different from baseline Area of Impairment: Orientation;Memory;Safety/judgement Orientation Level: Situation;Place   Memory: Decreased  short-term memory              General Comments      Exercises     Assessment/Plan    PT Assessment Patient needs continued PT services  PT Problem List Decreased activity tolerance;Decreased balance;Decreased knowledge of use of DME;Decreased mobility          PT Treatment Interventions Gait training;Functional mobility training;Therapeutic activities;Therapeutic exercise;Balance training;Patient/family education    PT  Goals (Current goals can be found in the Care Plan section)  Acute Rehab PT Goals Patient Stated Goal: son's goal is for pt to be able to walk safely to bathroom independently PT Goal Formulation: With patient/family Time For Goal Achievement: 01/16/16 Potential to Achieve Goals: Fair    Frequency Min 3X/week   Barriers to discharge        Co-evaluation               End of Session Equipment Utilized During Treatment: Gait belt Activity Tolerance: Patient tolerated treatment well Patient left: in chair;with call bell/phone within reach;with family/visitor present;with chair alarm set Nurse Communication: Mobility status         Time: [id number] PT Time Calculation (min) (ACUTE ONLY): 32 min   Charges:   PT Evaluation $PT Eval Moderate Complexity: 1 Procedure PT Treatments $Gait Training: 8-22 mins   PT G Codes:        Tamala SerUhlenberg, Farah Lepak Kistler 01/02/2016, 3:35 PM [phone]

## 2016-01-02 NOTE — Progress Notes (Signed)
PROGRESS NOTE [facility]   [name]   ZOX:[id number] DOB: 08-Jan-1921  DOA: 01/01/2016 PCP: Shirline Frees, NP   Brief Narrative:  [name] is a 82 y.o. female with medical history significant of Descemet's CHF, CAD, dementia and advanced age. Bring to the hospital because of generalized weakness, her some reported she was diagnosed with UTI 3 days ago was on Keflex. Yesterday he found her on the floor covered in urine and stool and couldn't get up. He also reported that she has been having hand tremor does have been prominent since infection started.  Of note some reported she has been more forgetful lately, hand tremor at rest, micrographia, slow movements and shuffling gait. Which seemed to more prominent after she started to get sick several days ago.   Subjective: Patient seen and examined with family bedside. Per family patient doing much better mentally, but still slight weakness. Patient denies any chest pain, shortness of breath, dizziness, palpitations, abdominal pain and dysuria.  Assessment & Plan:   UTI  -Recently diagnosed with UTI started on Keflex, urinalysis still shows multiple plus cells. -Continue Rocephin -Hold IV fluids, on my exam mild crackles in the bases  Generalized weakness - ? postictal state patient with history of seizures in the past, patient had an episode of urinary and stool incontinence, and son report tremors in hands and face of unknown origin. -We'll monitor his tremors continue consider ordering an EEG -PT/OT to evaluate and treat.  Elevated troponin - trending down, likely secondary to infectious process -Troponin of 0.15, patient denies any chest pain, EKG without ischemic findings. -Continue aspirin and Coreg -No further evaluation needed  Hypothyroidism - clinically euthyroid TSH normal -Currently not taking any medication  Dementia - some signs and symptoms of Parkinson -PT/OT -Patient has a neurologist that she  follow-up with, this as follow-up as an outpatient  DVT prophylaxis: SQ Heparin Code Status: DNR/DNI Family Communication:  Family at bedside Disposition Plan: Home may need HHA, when medically improved, likely 48 hours  Consultants:   None  Procedures:   None  Antimicrobials:  Rocephin 11/5   Objective: Vitals:   01/02/16 0423 01/02/16 0521 01/02/16 1203 01/02/16 1204  BP: (!) 189/70 (!) 121/56 (!) 187/52 129/73  Pulse: 65 87 66 72  Resp: 18 18    Temp: 98.8 F (37.1 C) 99.6 F (37.6 C) 98.5 F (36.9 C)   TempSrc: Oral Oral Oral   SpO2: 98% 93%    Weight:      Height:        Intake/Output Summary (Last 24 hours) at 01/02/16 1455 Last data filed at 01/02/16 1343  Gross per 24 hour  Intake           1932.5 ml  Output              925 ml  Net           1007.5 ml   Filed Weights   01/01/16 1850  Weight: 50.8 kg (112 lb)    Examination:  General exam: Appears calm and comfortable, Oriented to person but not to time or place HEENT: AC/AT, PERRLA, OP moist and clear Respiratory system: Good air entry, minimal crackles at the bases Cardiovascular system: S1 & S2 heard, RRR. Systolic murmur, rubs or gallops Gastrointestinal system: Abdomen is nondistended, soft and non tender. Normal bowel sounds heard. Central nervous system: No focal neurological deficits. Resting tremor noted in the right hand Extremities: No pedal edema. Symmetric  Skin: No rashes, lesions or ulcers Psychiatry:. Mood & affect appropriate.    Data Reviewed: I have personally reviewed following labs and imaging studies  CBC:  Recent Labs Lab 01/01/16 1026 01/02/16 0313  WBC 11.5* 8.0  NEUTROABS 8.4*  --   HGB 12.2 10.5*  HCT 36.2 30.7*  MCV 93.3 93.6  PLT 164 141*   Basic Metabolic Panel:  Recent Labs Lab 01/01/16 1026 01/02/16 0313  NA 137 136  K 3.8 3.0*  CL 101 102  CO2 27 26  GLUCOSE 99 94  BUN 27* 35*  CREATININE 1.15* 1.34*  CALCIUM 9.3 8.3*   GFR: Estimated  Creatinine Clearance: 18.6 mL/min (by C-G formula based on SCr of 1.34 mg/dL (H)). Liver Function Tests:  Recent Labs Lab 01/01/16 1026  AST 58*  ALT 36  ALKPHOS 56  BILITOT 0.9  PROT 6.7  ALBUMIN 3.2*   No results for input(s): LIPASE, AMYLASE in the last 168 hours. No results for input(s): AMMONIA in the last 168 hours. Coagulation Profile:  Recent Labs Lab 01/01/16 1421  INR 0.99   Cardiac Enzymes:  Recent Labs Lab 01/01/16 1026 01/01/16 1421 01/01/16 1959  CKTOTAL 658*  --   --   TROPONINI 0.15* 0.12* 0.10*   BNP (last 3 results) No results for input(s): PROBNP in the last 8760 hours. HbA1C: No results for input(s): HGBA1C in the last 72 hours. CBG: No results for input(s): GLUCAP in the last 168 hours. Lipid Profile: No results for input(s): CHOL, HDL, LDLCALC, TRIG, CHOLHDL, LDLDIRECT in the last 72 hours. Thyroid Function Tests:  Recent Labs  01/01/16 1421  TSH 4.261   Anemia Panel: No results for input(s): VITAMINB12, FOLATE, FERRITIN, TIBC, IRON, RETICCTPCT in the last 72 hours. Sepsis Labs:  Recent Labs Lab 01/01/16 1137  LATICACIDVEN 1.53    No results found for this or any previous visit (from the past 240 hour(s)).       Radiology Studies: Dg Shoulder Right  Result Date: 01/01/2016 CLINICAL DATA:  Fall. EXAM: RIGHT SHOULDER - 2+ VIEW COMPARISON:  None. FINDINGS: Visualized portion of the right hemithorax is normal. No acute fracture or dislocation. Degenerative irregularity of the rotator cuff insertion. Mild acromioclavicular joint degenerative change as well. IMPRESSION: No acute osseous abnormality. Electronically Signed   By: Jeronimo GreavesKyle  Talbot M.D.   On: 01/01/2016 10:38   Ct Head Wo Contrast  Result Date: 01/01/2016 CLINICAL DATA:  Fall, favoring LEFT leg, history hypertension, coronary artery disease, CHF, stroke, former smoker EXAM: CT HEAD WITHOUT CONTRAST TECHNIQUE: Contiguous axial images were obtained from the base of the skull  through the vertex without intravenous contrast. COMPARISON:  10/05/2015 FINDINGS: Brain: Generalized atrophy. Normal ventricular morphology. No midline shift or mass effect. Small vessel chronic ischemic changes of deep cerebral white matter. No intracranial hemorrhage, mass lesion, evidence of acute infarction, or extra-axial fluid collection. Vascular: Atherosclerotic calcifications at the carotid siphons Skull: Intact Sinuses/Orbits: Opacified RIGHT maxillary sinus Other: N/A IMPRESSION: Atrophy with small vessel chronic ischemic changes of deep cerebral white matter. No acute intracranial abnormalities. Electronically Signed   By: Ulyses SouthwardMark  Boles M.D.   On: 01/01/2016 10:11   Dg Chest Port 1 View  Result Date: 01/01/2016 CLINICAL DATA:  Fall.  Mental status changes. EXAM: PORTABLE CHEST 1 VIEW COMPARISON:  06/25/2008 FINDINGS: Midline trachea. Normal heart size. Atherosclerosis in the transverse aorta. No pleural effusion or pneumothorax. Mild scarring at the left lung base laterally. IMPRESSION: No acute cardiopulmonary disease. Aortic atherosclerosis. Electronically Signed  By: Jeronimo GreavesKyle  Talbot M.D.   On: 01/01/2016 10:34      Scheduled Meds: . aspirin EC  81 mg Oral Daily  . atorvastatin  20 mg Oral q1800  . carvedilol  6.25 mg Oral BID WC  . cefTRIAXone (ROCEPHIN)  IV  1 g Intravenous Q24H  . divalproex  500 mg Oral QHS  . donepezil  10 mg Oral QHS  . heparin  5,000 Units Subcutaneous Q8H  . sertraline  25 mg Oral Daily   Continuous Infusions: . sodium chloride 10 mL/hr at 01/02/16 1156     LOS: 0 days    Latrelle DodrillEdwin Silva, MD [facility] Pager [phone]  If 7PM-7AM, please contact night-coverage [email or web address] Password [email or web address] 01/02/2016, 2:55 PM

## 2016-01-03 DIAGNOSIS — R451 Restlessness and agitation: Secondary | ICD-10-CM

## 2016-01-03 LAB — CBC
HEMATOCRIT: 33.1 % — AB (ref 36.0–46.0)
HEMOGLOBIN: 11.4 g/dL — AB (ref 12.0–15.0)
MCH: 32.3 pg (ref 26.0–34.0)
MCHC: 34.4 g/dL (ref 30.0–36.0)
MCV: 93.8 fL (ref 78.0–100.0)
Platelets: 156 10*3/uL (ref 150–400)
RBC: 3.53 MIL/uL — ABNORMAL LOW (ref 3.87–5.11)
RDW: 16.4 % — ABNORMAL HIGH (ref 11.5–15.5)
WBC: 7 10*3/uL (ref 4.0–10.5)

## 2016-01-03 LAB — BASIC METABOLIC PANEL
ANION GAP: 8 (ref 5–15)
BUN: 21 mg/dL — ABNORMAL HIGH (ref 6–20)
CO2: 26 mmol/L (ref 22–32)
Calcium: 8.5 mg/dL — ABNORMAL LOW (ref 8.9–10.3)
Chloride: 104 mmol/L (ref 101–111)
Creatinine, Ser: 0.96 mg/dL (ref 0.44–1.00)
GFR calc Af Amer: 57 mL/min — ABNORMAL LOW (ref >60)
GFR, EST NON AFRICAN AMERICAN: 49 mL/min — AB (ref >60)
GLUCOSE: 98 mg/dL (ref 65–99)
POTASSIUM: 3.6 mmol/L (ref 3.5–5.1)
Sodium: 138 mmol/L (ref 135–145)

## 2016-01-03 LAB — HEMOGLOBIN A1C
Hgb A1c MFr Bld: 5.2 % (ref 4.8–5.6)
MEAN PLASMA GLUCOSE: 103 mg/dL

## 2016-01-03 MED ORDER — RISPERIDONE 0.5 MG PO TABS
0.5000 mg | ORAL_TABLET | Freq: Two times a day (BID) | ORAL | Status: DC
  Administered 2016-01-03 – 2016-01-04 (×3): 0.5 mg via ORAL
  Filled 2016-01-03 (×3): qty 1

## 2016-01-03 MED ORDER — HALOPERIDOL LACTATE 5 MG/ML IJ SOLN
0.5000 mg | Freq: Four times a day (QID) | INTRAMUSCULAR | Status: DC | PRN
  Administered 2016-01-04 (×2): 0.5 mg via INTRAVENOUS
  Filled 2016-01-03 (×2): qty 1

## 2016-01-03 MED ORDER — LEVOTHYROXINE SODIUM 25 MCG PO TABS
25.0000 ug | ORAL_TABLET | Freq: Every day | ORAL | Status: DC
  Administered 2016-01-03 – 2016-01-04 (×2): 25 ug via ORAL
  Filled 2016-01-03 (×2): qty 1

## 2016-01-03 NOTE — Clinical Social Work Placement (Addendum)
Patient has a bed at [facility]. CSW has completed FL2 & will continue to follow and assist with discharge when ready.    [name] Zaquan Duffner, LCSW [facility] Clinical Social Worker cell #: [phone]     CLINICAL SOCIAL WORK PLACEMENT  NOTE  Date:  01/03/2016  Patient Details  Name: [name] MRN: [id number] Date of Birth: 07/21/1920  Clinical Social Work is seeking post-discharge placement for this patient at the Skilled  Nursing Facility level of care (*CSW will initial, date and re-position this form in  chart as items are completed):  Yes   Patient/family provided with [place] Clinical Social Work Department's list of facilities offering this level of care within the geographic area requested by the patient (or if unable, by the patient's family).  Yes   Patient/family informed of their freedom to choose among providers that offer the needed level of care, that participate in Medicare, Medicaid or managed care program needed by the patient, have an available bed and are willing to accept the patient.  Yes   Patient/family informed of 's ownership interest in [facility] and [facility], as well as of the fact that they are under no obligation to receive care at these facilities.  PASRR submitted to EDS on 01/03/16     PASRR number received on 01/03/16     Existing PASRR number confirmed on       FL2 transmitted to all facilities in geographic area requested by pt/family on 01/03/16     FL2 transmitted to all facilities within larger geographic area on       Patient informed that his/her managed care company has contracts with or will negotiate with certain facilities, including the following:        Yes   Patient/family informed of bed offers received.  Patient chooses bed at [facility]     Physician recommends and patient chooses bed at      Patient to be transferred to [facility] on  .  Patient to be transferred to  facility by       Patient family notified on   of transfer.  Name of family member notified:        PHYSICIAN       Additional Comment:    _______________________________________________ Arlyss RepressHarrison, Jane Birkel F, LCSW 01/03/2016, 10:39 AM

## 2016-01-03 NOTE — Clinical Social Work Note (Signed)
Clinical Social Work Assessment  Patient Details  Name: [name] MRN: [id number] Date of Birth: 1920/11/30  Date of referral:  01/03/16               Reason for consult:  Facility Placement                Permission sought to share information with:  Chartered certified accountant granted to share information::  Yes, Verbal Permission Granted  Name::        Agency::     Relationship::     Contact Information:     Housing/Transportation Living arrangements for the past 2 months:  Single Family Home Source of Information:  Adult Children Patient Interpreter Needed:  None Criminal Activity/Legal Involvement Pertinent to Current Situation/Hospitalization:  No - Comment as needed Significant Relationships:  Adult Children Lives with:  Self Do you feel safe going back to the place where you live?  No Need for family participation in patient care:  Yes (Comment)  Care giving concerns:  CSW reviewed PT evaluation recommending SNF at discharge.    Social Worker assessment / plan:  CSW met with patient's son, [name] at bedside re: discharge planning. Patient's son is agreeable with plan for SNF - states that she was living alone, but right around the corner from where he and his wife live and he would go every morning at 8am to get her out of bed and stay with her through the day.   Employment status:  Retired Forensic scientist:  Medicare PT Recommendations:  [place] / Referral to community resources:  [place]  Patient/Family's Response to care:  Patient's son states that he has heard great things about [employer] [facility] but would prefer [facility] as their first choice.   Patient/Family's Understanding of and Emotional Response to Diagnosis, Current Treatment, and Prognosis:  Patient's son is concerned about UTI, states that she has had a UTI 2 other times - but this time is the worst.   Emotional  Assessment Appearance:  Appears stated age Attitude/Demeanor/Rapport:    Affect (typically observed):    Orientation:  Oriented to Self Alcohol / Substance use:    Psych involvement (Current and /or in the community):     Discharge Needs  Concerns to be addressed:    Readmission within the last 30 days:    Current discharge risk:    Barriers to Discharge:      Standley Brooking, LCSW 01/03/2016, 9:47 AM

## 2016-01-03 NOTE — Progress Notes (Signed)
PROGRESS NOTE [facility]   Kathryn KidRuth M Spray   ZOX:[id number] DOB: 05/08/1920  DOA: 01/01/2016 PCP: Shirline Freesory Nafziger, NP   Brief Narrative:  [name] is a 82 y.o. female with medical history significant of Descemet's CHF, CAD, dementia and advanced age. Bring to the hospital because of generalized weakness, her some reported she was diagnosed with UTI 3 days ago was on Keflex. Yesterday he found her on the floor covered in urine and stool and couldn't get up. He also reported that she has been having hand tremor does have been prominent since infection started.  Of note some reported she has been more forgetful lately, hand tremor at rest, micrographia, slow movements and shuffling gait. Which seemed to more prominent after she started to get sick several days ago.   Subjective: Clinical improving although patient was agitated overnight had to receive Haldol is probably likely to sundowning. This morning patient eating breakfast very pleasant. Continues to be slight confused  Assessment & Plan:   UTI - Recently diagnosed with UTI started on Keflex, urinalysis still shows multiple plus cells.  -No cultures ordered and patient was on prior antibiotic -Continue Rocephin  Generalized weakness - ? postictal state patient with history of seizures in the past, patient had an episode of urinary and stool incontinence, and son report tremors in hands and face of unknown origin. -We'll monitor his tremors continue consider ordering an EEG -PT/OT to evaluate and treat. -Recommendation for SNF  Elevated troponin - trending down, likely secondary to infectious process -Troponin of 0.15, patient denies any chest pain, EKG without ischemic findings. -Continue aspirin and Coreg -No further evaluation needed  Hypothyroidism - clinically euthyroid TSH normal -Currently not taking any medication  Dementia - some signs and symptoms of Parkinson - sundowning/hospital delirium -PT/OT  recommended SNF. -We'll start patient on Risperdal 0.5 milligrams twice a day -Haldol 0.5 mg when necessary agitation -Patient has a neurologist that she follow-up with, this as follow-up as an outpatient   DVT prophylaxis: SQ Heparin Code Status: DNR/DNI Family Communication:  Family at bedside Disposition Plan:  for SNF in 48 hours  Consultants:   None  Procedures:   None  Antimicrobials:  Rocephin 11/5   Objective: Vitals:   01/02/16 2242 01/03/16 0628 01/03/16 1337 01/03/16 1500  BP: (!) 157/92 134/76 (!) 148/37 (!) 155/44  Pulse: 88 69 (!) 57 (!) 59  Resp: 14 16 18 13   Temp: 98.6 F (37 C) 97.2 F (36.2 C) 97.6 F (36.4 C) 98 F (36.7 C)  TempSrc: Oral Oral Oral Oral  SpO2: 99% 99% 98% 99%  Weight:      Height:        Intake/Output Summary (Last 24 hours) at 01/03/16 1601 Last data filed at 01/03/16 1452  Gross per 24 hour  Intake              560 ml  Output              775 ml  Net             -215 ml   Filed Weights   01/01/16 1850  Weight: 50.8 kg (112 lb)    Examination:  General exam: Appears calm and comfortable, Oriented to person but not to time or place Respiratory system: Good air entry,  Cardiovascular system: S1 & S2 heard, RRR. Systolic murmur. Gastrointestinal system: Abdomen is nondistended, soft and non tender. Normal bowel sounds heard. Central nervous system: No focal neurological deficits. Resting  tremor noted in the right hand Extremities: No pedal edema. Symmetric  Skin: No rashes, lesions or ulcers Psychiatry:. Mood & affect appropriate.   Data Reviewed: I have personally reviewed following labs and imaging studies  CBC:  Recent Labs Lab 01/01/16 1026 01/02/16 0313 01/03/16 0320  WBC 11.5* 8.0 7.0  NEUTROABS 8.4*  --   --   HGB 12.2 10.5* 11.4*  HCT 36.2 30.7* 33.1*  MCV 93.3 93.6 93.8  PLT 164 141* 156   Basic Metabolic Panel:  Recent Labs Lab 01/01/16 1026 01/02/16 0313 01/03/16 0320  NA 137 136 138    K 3.8 3.0* 3.6  CL 101 102 104  CO2 27 26 26   GLUCOSE 99 94 98  BUN 27* 35* 21*  CREATININE 1.15* 1.34* 0.96  CALCIUM 9.3 8.3* 8.5*   GFR: Estimated Creatinine Clearance: 26 mL/min (by C-G formula based on SCr of 0.96 mg/dL). Liver Function Tests:  Recent Labs Lab 01/01/16 1026  AST 58*  ALT 36  ALKPHOS 56  BILITOT 0.9  PROT 6.7  ALBUMIN 3.2*   No results for input(s): LIPASE, AMYLASE in the last 168 hours. No results for input(s): AMMONIA in the last 168 hours. Coagulation Profile:  Recent Labs Lab 01/01/16 1421  INR 0.99   Cardiac Enzymes:  Recent Labs Lab 01/01/16 1026 01/01/16 1421 01/01/16 1959  CKTOTAL 658*  --   --   TROPONINI 0.15* 0.12* 0.10*   BNP (last 3 results) No results for input(s): PROBNP in the last 8760 hours. HbA1C:  Recent Labs  01/01/16 1421  HGBA1C 5.2   CBG: No results for input(s): GLUCAP in the last 168 hours. Lipid Profile: No results for input(s): CHOL, HDL, LDLCALC, TRIG, CHOLHDL, LDLDIRECT in the last 72 hours. Thyroid Function Tests:  Recent Labs  01/01/16 1421  TSH 4.261   Anemia Panel: No results for input(s): VITAMINB12, FOLATE, FERRITIN, TIBC, IRON, RETICCTPCT in the last 72 hours. Sepsis Labs:  Recent Labs Lab 01/01/16 1137  LATICACIDVEN 1.53    No results found for this or any previous visit (from the past 240 hour(s)).     Radiology Studies: No results found.    Scheduled Meds: . aspirin EC  81 mg Oral Daily  . atorvastatin  20 mg Oral q1800  . carvedilol  6.25 mg Oral BID WC  . cefTRIAXone (ROCEPHIN)  IV  1 g Intravenous Q24H  . divalproex  500 mg Oral QHS  . donepezil  10 mg Oral QHS  . heparin  5,000 Units Subcutaneous Q8H  . levothyroxine  25 mcg Oral QAC breakfast  . sertraline  25 mg Oral Daily   Continuous Infusions: . sodium chloride 10 mL/hr at 01/02/16 2334     LOS: 1 day    Latrelle DodrillEdwin Silva, MD [facility] Pager [phone]  If 7PM-7AM, please contact  night-coverage [email or web address] Password [email or web address] 01/03/2016, 4:01 PM

## 2016-01-03 NOTE — Consult Note (Signed)
   [facility] CM Inpatient Consult   01/03/2016  Dineen KidRuth M Robertson 12/26/1920 [id number]    [facility]. Chart reviewed. Noted current discharge plan is for SNF  There are no identifiable [facility] needs at this time. Confirmed with inpatient CSW. If patient's post hospital needs change, please place a [facility] consult. For questions please contact:  Raiford Nobletika Hall, MSN-Ed, RN,BSN [facility] Care Management Hospital Liaison [phone]

## 2016-01-03 NOTE — Progress Notes (Signed)
At shift change, patient is currently resting and not requiring waist belt restraint.  Will discontinue the restraint order and notify MD if any changes occur.  Will continue to monitor.

## 2016-01-03 NOTE — Progress Notes (Signed)
OT Cancellation Note  Patient Details Name: Dineen KidRuth M Shepardson MRN: [id number] DOB: 07/07/1920   Cancelled Treatment:    Reason Eval/Treat Not Completed: Other (comment)  Noted plan for SNF- will defer OT eval to SNF  [facility] MichiganREDDING, Dorena BodoLorraine D  Lori Nike [place], [place] [phone] 01/03/2016, 11:14 AM

## 2016-01-03 NOTE — NC FL2 (Signed)
[place] MEDICAID FL2 LEVEL OF CARE SCREENING TOOL     IDENTIFICATION  Patient Name: [name] Birthdate: 06/12/1920 Sex: female Admission Date (Current Location): 01/01/2016  [facility]   Facility and Address:  [facility],  [address], [place] [id number]      Provider Number: [id number]  Attending Physician Name and Address:  Lenox PondsEdwin Silva Zapata, MD  Relative Name and Phone Number:       Current Level of Care: Hospital Recommended Level of Care: Skilled Nursing Facility Prior Approval Number:    Date Approved/Denied:   PASRR Number: [id number]  Discharge Plan: SNF    Current Diagnoses: Patient Active Problem List   Diagnosis Date Noted  . Dementia without behavioral disturbance   . Troponin I above reference range 01/01/2016  . UTI (urinary tract infection) 01/01/2016  . General weakness 01/01/2016  . Abnormality of gait 02/07/2015  . Memory loss 07/17/2013  . Seizure disorder, focal motor (HCC) 03/26/2012  . Carotid stenosis 06/26/2011  . Depression 05/10/2010  . HYPERLIPIDEMIA-MIXED 11/05/2008  . DIASTOLIC HEART FAILURE, CHRONIC 09/17/2008  . UNSPECIFIED VENOUS INSUFFICIENCY 06/09/2008  . CORONARY ATHEROSCLEROSIS NATIVE CORONARY ARTERY 05/12/2008  . Hypothyroidism 12/05/2007  . CONSTIPATION, DRUG INDUCED 09/02/2007  . BREAST CYST 09/02/2007  . UNS ADVRS EFF UNS RX MEDICINAL&BIOLOGICAL SBSTNC 06/04/2007  . Essential hypertension 07/25/2006  . OSTEOPOROSIS 07/25/2006  . CEREBROVASCULAR ACCIDENT, HX OF 07/25/2006    Orientation RESPIRATION BLADDER Height & Weight     Self, Time, Situation, Place  Normal Incontinent Weight: 112 lb (50.8 kg) Height:  4\' 10"  (147.3 cm)  BEHAVIORAL SYMPTOMS/MOOD NEUROLOGICAL BOWEL NUTRITION STATUS      Continent Diet (Heart)  AMBULATORY STATUS COMMUNICATION OF NEEDS Skin   Extensive Assist Verbally Normal                       Personal Care Assistance Level of  Assistance  Bathing, Dressing Bathing Assistance: Limited assistance   Dressing Assistance: Limited assistance     Functional Limitations Info             SPECIAL CARE FACTORS FREQUENCY  PT (By licensed PT), OT (By licensed OT)     PT Frequency: 5 OT Frequency: 5            Contractures      Additional Factors Info  Code Status, Allergies Code Status Info: DNR Allergies Info: NKDA           Current Medications (01/03/2016):  This is the current hospital active medication list Current Facility-Administered Medications  Medication Dose Route Frequency Provider Last Rate Last Dose  . 0.9 %  sodium chloride infusion   Intravenous Continuous Lenox PondsEdwin Silva Zapata, MD 10 mL/hr at 01/02/16 2334    . acetaminophen (TYLENOL) tablet 650 mg  650 mg Oral Q6H PRN Clydia LlanoMutaz Elmahi, MD       Or  . acetaminophen (TYLENOL) suppository 650 mg  650 mg Rectal Q6H PRN Clydia LlanoMutaz Elmahi, MD      . aspirin EC tablet 81 mg  81 mg Oral Daily Clydia LlanoMutaz Elmahi, MD   81 mg at 01/02/16 1100  . atorvastatin (LIPITOR) tablet 20 mg  20 mg Oral q1800 Clydia LlanoMutaz Elmahi, MD   20 mg at 01/02/16 1803  . carvedilol (COREG) tablet 6.25 mg  6.25 mg Oral BID WC Clydia LlanoMutaz Elmahi, MD   6.25 mg at 01/02/16 1803  . cefTRIAXone (ROCEPHIN) 1 g in dextrose 5 % 50  mL IVPB  1 g Intravenous Q24H Clydia LlanoMutaz Elmahi, MD   1 g at 01/02/16 1648  . divalproex (DEPAKOTE ER) 24 hr tablet 500 mg  500 mg Oral QHS Clydia LlanoMutaz Elmahi, MD   500 mg at 01/02/16 2243  . donepezil (ARICEPT) tablet 10 mg  10 mg Oral QHS Clydia LlanoMutaz Elmahi, MD   10 mg at 01/02/16 2243  . haloperidol lactate (HALDOL) injection 2 mg  2 mg Intravenous Q6H PRN Clydia LlanoMutaz Elmahi, MD   2 mg at 01/03/16 0223  . heparin injection 5,000 Units  5,000 Units Subcutaneous Q8H Clydia LlanoMutaz Elmahi, MD   5,000 Units at 01/03/16 0629  . HYDROcodone-acetaminophen (NORCO/VICODIN) 5-325 MG per tablet 1-2 tablet  1-2 tablet Oral Q4H PRN Clydia LlanoMutaz Elmahi, MD      . levothyroxine (SYNTHROID, LEVOTHROID) tablet 25 mcg  25 mcg Oral  QAC breakfast Lenox PondsEdwin Silva Zapata, MD      . nitroGLYCERIN (NITROSTAT) SL tablet 0.4 mg  0.4 mg Sublingual Q5 Min x 3 PRN Clydia LlanoMutaz Elmahi, MD      . ondansetron (ZOFRAN) tablet 4 mg  4 mg Oral Q6H PRN Clydia LlanoMutaz Elmahi, MD       Or  . ondansetron (ZOFRAN) injection 4 mg  4 mg Intravenous Q6H PRN Clydia LlanoMutaz Elmahi, MD      . sertraline (ZOLOFT) tablet 25 mg  25 mg Oral Daily Clydia LlanoMutaz Elmahi, MD   25 mg at 01/02/16 1100     Discharge Medications: Please see discharge summary for a list of discharge medications.  Relevant Imaging Results:  Relevant Lab Results:   Additional Information SSN: [id number]  Arlyss RepressHarrison, Roxas Clymer F, LCSW

## 2016-01-04 ENCOUNTER — Ambulatory Visit: Payer: Medicare Other | Admitting: Cardiology

## 2016-01-04 DIAGNOSIS — F0390 Unspecified dementia without behavioral disturbance: Secondary | ICD-10-CM

## 2016-01-04 DIAGNOSIS — N3000 Acute cystitis without hematuria: Secondary | ICD-10-CM

## 2016-01-04 DIAGNOSIS — I10 Essential (primary) hypertension: Secondary | ICD-10-CM

## 2016-01-04 DIAGNOSIS — R531 Weakness: Secondary | ICD-10-CM

## 2016-01-04 DIAGNOSIS — E039 Hypothyroidism, unspecified: Secondary | ICD-10-CM

## 2016-01-04 LAB — CBC
HEMATOCRIT: 33.3 % — AB (ref 36.0–46.0)
Hemoglobin: 11.1 g/dL — ABNORMAL LOW (ref 12.0–15.0)
MCH: 31.4 pg (ref 26.0–34.0)
MCHC: 33.3 g/dL (ref 30.0–36.0)
MCV: 94.1 fL (ref 78.0–100.0)
Platelets: 167 10*3/uL (ref 150–400)
RBC: 3.54 MIL/uL — AB (ref 3.87–5.11)
RDW: 16.6 % — ABNORMAL HIGH (ref 11.5–15.5)
WBC: 6.8 10*3/uL (ref 4.0–10.5)

## 2016-01-04 LAB — BASIC METABOLIC PANEL
Anion gap: 8 (ref 5–15)
BUN: 19 mg/dL (ref 6–20)
CHLORIDE: 100 mmol/L — AB (ref 101–111)
CO2: 28 mmol/L (ref 22–32)
Calcium: 8.5 mg/dL — ABNORMAL LOW (ref 8.9–10.3)
Creatinine, Ser: 1.02 mg/dL — ABNORMAL HIGH (ref 0.44–1.00)
GFR calc non Af Amer: 46 mL/min — ABNORMAL LOW (ref >60)
GFR, EST AFRICAN AMERICAN: 53 mL/min — AB (ref >60)
Glucose, Bld: 102 mg/dL — ABNORMAL HIGH (ref 65–99)
POTASSIUM: 3.8 mmol/L (ref 3.5–5.1)
SODIUM: 136 mmol/L (ref 135–145)

## 2016-01-04 MED ORDER — HYDRALAZINE HCL 20 MG/ML IJ SOLN
5.0000 mg | INTRAMUSCULAR | Status: DC | PRN
  Administered 2016-01-04: 5 mg via INTRAVENOUS
  Filled 2016-01-04: qty 1

## 2016-01-04 NOTE — Progress Notes (Signed)
Physical Therapy Treatment Patient Details Name: Dineen KidRuth M Mincy MRN: [id number] DOB: 08/22/1920 Today's Date: 01/04/2016    History of Present Illness 82 y.o. female admitted with fall, UTI, weakness. PMH CHF, dementia, falls, MI, Parkinson's.     PT Comments    Pt assisted to standing and able to march in place however did not ambulate.  Pt requiring mod assist at this time for transferring.  Pt and son agreeable to SNF upon d/c for continued rehab.  Follow Up Recommendations  SNF;Supervision/Assistance - 24 hour     Equipment Recommendations  None recommended by PT    Recommendations for Other Services       Precautions / Restrictions Precautions Precautions: Fall Precaution Comments: h/o 3-4 falls in past 1 year per pt's son Restrictions Weight Bearing Restrictions: No    Mobility  Bed Mobility Overal bed mobility: Needs Assistance Bed Mobility: Supine to Sit     Supine to sit: Mod assist     General bed mobility comments: mod A to raise trunk and pivot hips with pad to EOB  Transfers Overall transfer level: Needs assistance Equipment used: Rolling walker (2 wheeled) Transfers: Sit to/from [employer]GI CorporationStand;Stand Pivot Transfers Sit to Stand: Mod assist Stand pivot transfers: Mod assist       General transfer comment: assist to rise and steady due to posterior lean, assist for controlling descent, pt able to march in place however requested pt ambulate and she remained still, assisted with moving RW and pt still unable to ambulate so assisted pt with pivot to recliner  Ambulation/Gait                 Stairs            Wheelchair Mobility    Modified Rankin (Stroke Patients Only)       Balance                                    Cognition Arousal/Alertness: Awake/alert   Overall Cognitive Status: Impaired/Different from baseline Area of Impairment: Memory;Safety/judgement     Memory: Decreased short-term memory    Safety/Judgement: Decreased awareness of safety;Decreased awareness of deficits          Exercises General Exercises - Lower Extremity Ankle Circles/Pumps: AROM;Both;10 reps Quad Sets: AROM;Both;10 reps Heel Slides: AROM;Both;10 reps Hip ABduction/ADduction: AROM;Both;10 reps    General Comments        Pertinent Vitals/Pain Pain Assessment: No/denies pain    Home Living                      Prior Function            PT Goals (current goals can now be found in the care plan section) Progress towards PT goals: Progressing toward goals    Frequency    Min 3X/week      PT Plan Current plan remains appropriate    Co-evaluation             End of Session Equipment Utilized During Treatment: Gait belt Activity Tolerance: Patient tolerated treatment well Patient left: in chair;with call bell/phone within reach;with family/visitor present;with chair alarm set     Time: [id number] PT Time Calculation (min) (ACUTE ONLY): 24 min  Charges:  $Therapeutic Exercise: 8-22 mins $Therapeutic Activity: 8-22 mins                    G Codes:  Kerrion Kemppainen,KATHrine E 01/04/2016, 12:47 PM Zenovia JarredKati Benjamine Strout, PT, DPT 01/04/2016 Pager: [phone]

## 2016-01-04 NOTE — Progress Notes (Signed)
PROGRESS NOTE    [name]  RUE:[id number] DOB: January 05, 1921 DOA: 01/01/2016 PCP: Shirline Frees, NP   Brief Narrative:  [name] a 82 y.o.femalewith medical history significant of Diastolic CHF, CAD, dementia and advanced age. She was brought to the hospital because of generalized weakness, her some reported she was diagnosed with UTI 3 days ago was on Keflex. Her son found her on the floor covered in urine and stool and couldn't get up. He also reported that she has been having hand tremor does have been prominent since infection started. Of note some reported she has been more forgetful lately, hand tremor at rest, micrographia, slow movements and shuffling gait. Which seemed to more prominent after she started to get sick several days ago. Improved today but BP was elevated. Per son she is more lucid today.   Assessment & Plan:   Principal Problem:   UTI (urinary tract infection) Active Problems:   Hypothyroidism   Memory loss   Troponin I above reference range   General weakness   Dementia without behavioral disturbance  UTI poA -Recently diagnosed with UTI started on Keflex, urinalysis still shows multiple plus cells.  -No cultures ordered and patient was on prior antibiotic -Continue Rocephin for Abx  Generalized weakness likely from UTI - ? postictal state patient with history of seizures in the past, patient had an episode of urinary and stool incontinence, and son report tremors in hands and face of unknown origin. -We'll monitor his tremors continue consider ordering an EEG if continues; No tremors today -C/w Depakote ER 500 mg po BID  -PT/OT to evaluate and treat. -Recommendation for SNF  Elevated troponin - trending down, likely secondary to infectious process -Troponin of 0.15, patient denies any chest pain, EKG without ischemic findings. -Continue aspirin and Coreg -No further evaluation needed at this point  Hypothyroidism - clinically euthyroid TSH  normal -Currently not taking any medication  Dementia - some signs and symptoms of Parkinson - sundowning/hospital delirium -? Component of Vascular Dementia vs. Lewy Body Dementia -PT/OT recommended SNF. -Continue with Doepezil 10 mg po qHS -We'll start patient on Risperdal 0.5 milligrams twice a day -Haldol 0.5 mg when necessary agitation -Patient has a Neurologist that she follow-up with, this as follow-up as an outpatient  Hypertension -C/w Carvedilol 6.25 mg po BID -Hydralazine 5 mg IV q4hprn for HBP  DVT prophylaxis: Sq Heparin Code Status: DNR/DNI Family Communication: Spoke with Patient's Son at bedside Disposition Plan: SNF likely in AM  Consultants:   None  Procedures: None  Antimicrobials: IV Ceftriaxone  Subjective: Seen and examined at beside. Pleasantly demented. No CP/SOB/N/V/Abdominal Pain. No other concerns or complaints and son thinks patient is more lucid today.   Objective: Vitals:   01/03/16 2040 01/04/16 0521 01/04/16 0609 01/04/16 0945  BP: (!) 148/58 (!) 190/67 (!) 195/66 (!) 148/49  Pulse: 64 (!) 50 66   Resp: 14 16    Temp: 98.5 F (36.9 C) 98.4 F (36.9 C)    TempSrc: Oral Oral    SpO2: 98% 96%    Weight:      Height:        Intake/Output Summary (Last 24 hours) at 01/04/16 1305 Last data filed at 01/04/16 0600  Gross per 24 hour  Intake              290 ml  Output              375 ml  Net              -  85 ml   Filed Weights   01/01/16 1850  Weight: 50.8 kg (112 lb)    Examination: Physical Exam:  Constitutional: NAD and appears calm and comfortable; pleasantly demented Eyes: Lids and conjunctivae normal, sclerae anicteric  ENMT: External Ears, Nose appear normal. Grossly normal hearing.  Neck: Appears normal, supple, no cervical masses, normal ROM, no appreciable thyromegaly or JVD Respiratory: Clear to auscultation bilaterally, no wheezing, rales, rhonchi or crackles. Normal respiratory effort and patient is not  tachypenic. No accessory muscle use.  Cardiovascular: RRR, no murmurs / rubs / gallops. S1 and S2 auscultated. No extremity edema.  Abdomen: Soft, non-tender, non-distended. No masses palpated. No appreciable hepatosplenomegaly. Bowel sounds positive.  GU: Deferred. Musculoskeletal: No clubbing / cyanosis of digits/nails.  Skin: No rashes, lesions, ulcers. No induration; Warm and dry.  Neurologic: CN 2-12 grossly intact with no focal deficits. Sensation intact in all 4 Extremities. Romberg sign cerebellar reflexes not assessed.  Psychiatric: Normal judgment and insight. Alert and awake. Oriented to person and place. Normal and pleasant mood and appropriate affect.   Data Reviewed: I have personally reviewed following labs and imaging studies  CBC:  Recent Labs Lab 01/01/16 1026 01/02/16 0313 01/03/16 0320 01/04/16 0335  WBC 11.5* 8.0 7.0 6.8  NEUTROABS 8.4*  --   --   --   HGB 12.2 10.5* 11.4* 11.1*  HCT 36.2 30.7* 33.1* 33.3*  MCV 93.3 93.6 93.8 94.1  PLT 164 141* 156 167   Basic Metabolic Panel:  Recent Labs Lab 01/01/16 1026 01/02/16 0313 01/03/16 0320 01/04/16 0335  NA 137 136 138 136  K 3.8 3.0* 3.6 3.8  CL 101 102 104 100*  CO2 27 26 26 28   GLUCOSE 99 94 98 102*  BUN 27* 35* 21* 19  CREATININE 1.15* 1.34* 0.96 1.02*  CALCIUM 9.3 8.3* 8.5* 8.5*   GFR: Estimated Creatinine Clearance: 24.4 mL/min (by C-G formula based on SCr of 1.02 mg/dL (H)). Liver Function Tests:  Recent Labs Lab 01/01/16 1026  AST 58*  ALT 36  ALKPHOS 56  BILITOT 0.9  PROT 6.7  ALBUMIN 3.2*   No results for input(s): LIPASE, AMYLASE in the last 168 hours. No results for input(s): AMMONIA in the last 168 hours. Coagulation Profile:  Recent Labs Lab 01/01/16 1421  INR 0.99   Cardiac Enzymes:  Recent Labs Lab 01/01/16 1026 01/01/16 1421 01/01/16 1959  CKTOTAL 658*  --   --   TROPONINI 0.15* 0.12* 0.10*   BNP (last 3 results) No results for input(s): PROBNP in the last  8760 hours. HbA1C:  Recent Labs  01/01/16 1421  HGBA1C 5.2   CBG: No results for input(s): GLUCAP in the last 168 hours. Lipid Profile: No results for input(s): CHOL, HDL, LDLCALC, TRIG, CHOLHDL, LDLDIRECT in the last 72 hours. Thyroid Function Tests:  Recent Labs  01/01/16 1421  TSH 4.261   Anemia Panel: No results for input(s): VITAMINB12, FOLATE, FERRITIN, TIBC, IRON, RETICCTPCT in the last 72 hours. Sepsis Labs:  Recent Labs Lab 01/01/16 1137  LATICACIDVEN 1.53    No results found for this or any previous visit (from the past 240 hour(s)).   Radiology Studies: No results found.  Scheduled Meds: . aspirin EC  81 mg Oral Daily  . atorvastatin  20 mg Oral q1800  . carvedilol  6.25 mg Oral BID WC  . cefTRIAXone (ROCEPHIN)  IV  1 g Intravenous Q24H  . divalproex  500 mg Oral QHS  . donepezil  10 mg Oral  QHS  . heparin  5,000 Units Subcutaneous Q8H  . levothyroxine  25 mcg Oral QAC breakfast  . risperiDONE  0.5 mg Oral BID  . sertraline  25 mg Oral Daily   Continuous Infusions: . sodium chloride 10 mL/hr at 01/04/16 0659    LOS: 2 days   Merlene Laughtermair Latif Sheikh, DO [facility] Pager [phone]  If 7PM-7AM, please contact night-coverage [email or web address] Password [email or web address] 01/04/2016, 1:05 PM

## 2016-01-05 DIAGNOSIS — Z8673 Personal history of transient ischemic attack (TIA), and cerebral infarction without residual deficits: Secondary | ICD-10-CM | POA: Diagnosis not present

## 2016-01-05 DIAGNOSIS — F329 Major depressive disorder, single episode, unspecified: Secondary | ICD-10-CM | POA: Diagnosis present

## 2016-01-05 DIAGNOSIS — Y92009 Unspecified place in unspecified non-institutional (private) residence as the place of occurrence of the external cause: Secondary | ICD-10-CM | POA: Diagnosis not present

## 2016-01-05 DIAGNOSIS — B9620 Unspecified Escherichia coli [E. coli] as the cause of diseases classified elsewhere: Secondary | ICD-10-CM | POA: Diagnosis present

## 2016-01-05 DIAGNOSIS — M6281 Muscle weakness (generalized): Secondary | ICD-10-CM | POA: Diagnosis not present

## 2016-01-05 DIAGNOSIS — Z8249 Family history of ischemic heart disease and other diseases of the circulatory system: Secondary | ICD-10-CM | POA: Diagnosis not present

## 2016-01-05 DIAGNOSIS — R404 Transient alteration of awareness: Secondary | ICD-10-CM | POA: Diagnosis not present

## 2016-01-05 DIAGNOSIS — R531 Weakness: Secondary | ICD-10-CM | POA: Diagnosis not present

## 2016-01-05 DIAGNOSIS — R5381 Other malaise: Secondary | ICD-10-CM | POA: Diagnosis not present

## 2016-01-05 DIAGNOSIS — R0989 Other specified symptoms and signs involving the circulatory and respiratory systems: Secondary | ICD-10-CM | POA: Diagnosis not present

## 2016-01-05 DIAGNOSIS — R600 Localized edema: Secondary | ICD-10-CM | POA: Diagnosis not present

## 2016-01-05 DIAGNOSIS — N3000 Acute cystitis without hematuria: Secondary | ICD-10-CM | POA: Diagnosis not present

## 2016-01-05 DIAGNOSIS — E871 Hypo-osmolality and hyponatremia: Secondary | ICD-10-CM | POA: Diagnosis not present

## 2016-01-05 DIAGNOSIS — Z87440 Personal history of urinary (tract) infections: Secondary | ICD-10-CM | POA: Diagnosis not present

## 2016-01-05 DIAGNOSIS — R41841 Cognitive communication deficit: Secondary | ICD-10-CM | POA: Diagnosis not present

## 2016-01-05 DIAGNOSIS — R627 Adult failure to thrive: Secondary | ICD-10-CM | POA: Diagnosis present

## 2016-01-05 DIAGNOSIS — Z66 Do not resuscitate: Secondary | ICD-10-CM | POA: Diagnosis present

## 2016-01-05 DIAGNOSIS — J690 Pneumonitis due to inhalation of food and vomit: Secondary | ICD-10-CM | POA: Diagnosis not present

## 2016-01-05 DIAGNOSIS — R2990 Unspecified symptoms and signs involving the nervous system: Secondary | ICD-10-CM | POA: Diagnosis not present

## 2016-01-05 DIAGNOSIS — E876 Hypokalemia: Secondary | ICD-10-CM | POA: Diagnosis not present

## 2016-01-05 DIAGNOSIS — I6789 Other cerebrovascular disease: Secondary | ICD-10-CM | POA: Diagnosis not present

## 2016-01-05 DIAGNOSIS — Z9181 History of falling: Secondary | ICD-10-CM | POA: Diagnosis not present

## 2016-01-05 DIAGNOSIS — Z7409 Other reduced mobility: Secondary | ICD-10-CM | POA: Diagnosis not present

## 2016-01-05 DIAGNOSIS — G3183 Dementia with Lewy bodies: Secondary | ICD-10-CM | POA: Diagnosis not present

## 2016-01-05 DIAGNOSIS — Z79899 Other long term (current) drug therapy: Secondary | ICD-10-CM | POA: Diagnosis not present

## 2016-01-05 DIAGNOSIS — I6523 Occlusion and stenosis of bilateral carotid arteries: Secondary | ICD-10-CM | POA: Diagnosis not present

## 2016-01-05 DIAGNOSIS — G308 Other Alzheimer's disease: Secondary | ICD-10-CM | POA: Diagnosis not present

## 2016-01-05 DIAGNOSIS — E039 Hypothyroidism, unspecified: Secondary | ICD-10-CM | POA: Diagnosis not present

## 2016-01-05 DIAGNOSIS — D638 Anemia in other chronic diseases classified elsewhere: Secondary | ICD-10-CM | POA: Diagnosis not present

## 2016-01-05 DIAGNOSIS — F0281 Dementia in other diseases classified elsewhere with behavioral disturbance: Secondary | ICD-10-CM | POA: Diagnosis not present

## 2016-01-05 DIAGNOSIS — R29810 Facial weakness: Secondary | ICD-10-CM | POA: Diagnosis present

## 2016-01-05 DIAGNOSIS — R509 Fever, unspecified: Secondary | ICD-10-CM | POA: Diagnosis not present

## 2016-01-05 DIAGNOSIS — Z87891 Personal history of nicotine dependence: Secondary | ICD-10-CM | POA: Diagnosis not present

## 2016-01-05 DIAGNOSIS — R1312 Dysphagia, oropharyngeal phase: Secondary | ICD-10-CM | POA: Diagnosis not present

## 2016-01-05 DIAGNOSIS — I5032 Chronic diastolic (congestive) heart failure: Secondary | ICD-10-CM | POA: Diagnosis not present

## 2016-01-05 DIAGNOSIS — Y92129 Unspecified place in nursing home as the place of occurrence of the external cause: Secondary | ICD-10-CM | POA: Diagnosis not present

## 2016-01-05 DIAGNOSIS — W19XXXA Unspecified fall, initial encounter: Secondary | ICD-10-CM | POA: Diagnosis not present

## 2016-01-05 DIAGNOSIS — R262 Difficulty in walking, not elsewhere classified: Secondary | ICD-10-CM | POA: Diagnosis not present

## 2016-01-05 DIAGNOSIS — G40109 Localization-related (focal) (partial) symptomatic epilepsy and epileptic syndromes with simple partial seizures, not intractable, without status epilepticus: Secondary | ICD-10-CM | POA: Diagnosis not present

## 2016-01-05 DIAGNOSIS — R4182 Altered mental status, unspecified: Secondary | ICD-10-CM | POA: Diagnosis not present

## 2016-01-05 DIAGNOSIS — R5383 Other fatigue: Secondary | ICD-10-CM | POA: Diagnosis not present

## 2016-01-05 DIAGNOSIS — I639 Cerebral infarction, unspecified: Secondary | ICD-10-CM | POA: Diagnosis not present

## 2016-01-05 DIAGNOSIS — Z23 Encounter for immunization: Secondary | ICD-10-CM | POA: Diagnosis not present

## 2016-01-05 DIAGNOSIS — I10 Essential (primary) hypertension: Secondary | ICD-10-CM | POA: Diagnosis not present

## 2016-01-05 DIAGNOSIS — I952 Hypotension due to drugs: Secondary | ICD-10-CM | POA: Diagnosis not present

## 2016-01-05 DIAGNOSIS — F0390 Unspecified dementia without behavioral disturbance: Secondary | ICD-10-CM | POA: Diagnosis not present

## 2016-01-05 DIAGNOSIS — G9340 Encephalopathy, unspecified: Secondary | ICD-10-CM | POA: Diagnosis not present

## 2016-01-05 DIAGNOSIS — R001 Bradycardia, unspecified: Secondary | ICD-10-CM | POA: Diagnosis not present

## 2016-01-05 DIAGNOSIS — D509 Iron deficiency anemia, unspecified: Secondary | ICD-10-CM | POA: Diagnosis not present

## 2016-01-05 DIAGNOSIS — I2510 Atherosclerotic heart disease of native coronary artery without angina pectoris: Secondary | ICD-10-CM | POA: Diagnosis not present

## 2016-01-05 DIAGNOSIS — J9811 Atelectasis: Secondary | ICD-10-CM | POA: Diagnosis not present

## 2016-01-05 DIAGNOSIS — N183 Chronic kidney disease, stage 3 (moderate): Secondary | ICD-10-CM | POA: Diagnosis not present

## 2016-01-05 DIAGNOSIS — R05 Cough: Secondary | ICD-10-CM | POA: Diagnosis not present

## 2016-01-05 DIAGNOSIS — R269 Unspecified abnormalities of gait and mobility: Secondary | ICD-10-CM | POA: Diagnosis not present

## 2016-01-05 DIAGNOSIS — Z7982 Long term (current) use of aspirin: Secondary | ICD-10-CM | POA: Diagnosis not present

## 2016-01-05 DIAGNOSIS — I130 Hypertensive heart and chronic kidney disease with heart failure and stage 1 through stage 4 chronic kidney disease, or unspecified chronic kidney disease: Secondary | ICD-10-CM | POA: Diagnosis present

## 2016-01-05 DIAGNOSIS — Z955 Presence of coronary angioplasty implant and graft: Secondary | ICD-10-CM | POA: Diagnosis not present

## 2016-01-05 DIAGNOSIS — I252 Old myocardial infarction: Secondary | ICD-10-CM | POA: Diagnosis not present

## 2016-01-05 LAB — BASIC METABOLIC PANEL
Anion gap: 8 (ref 5–15)
BUN: 17 mg/dL (ref 6–20)
CALCIUM: 8.8 mg/dL — AB (ref 8.9–10.3)
CO2: 27 mmol/L (ref 22–32)
CREATININE: 1.06 mg/dL — AB (ref 0.44–1.00)
Chloride: 101 mmol/L (ref 101–111)
GFR, EST AFRICAN AMERICAN: 51 mL/min — AB (ref >60)
GFR, EST NON AFRICAN AMERICAN: 44 mL/min — AB (ref >60)
Glucose, Bld: 101 mg/dL — ABNORMAL HIGH (ref 65–99)
Potassium: 3.6 mmol/L (ref 3.5–5.1)
SODIUM: 136 mmol/L (ref 135–145)

## 2016-01-05 LAB — CBC
HCT: 33.2 % — ABNORMAL LOW (ref 36.0–46.0)
Hemoglobin: 11.2 g/dL — ABNORMAL LOW (ref 12.0–15.0)
MCH: 31.7 pg (ref 26.0–34.0)
MCHC: 33.7 g/dL (ref 30.0–36.0)
MCV: 94.1 fL (ref 78.0–100.0)
PLATELETS: 165 10*3/uL (ref 150–400)
RBC: 3.53 MIL/uL — AB (ref 3.87–5.11)
RDW: 16.7 % — ABNORMAL HIGH (ref 11.5–15.5)
WBC: 6.6 10*3/uL (ref 4.0–10.5)

## 2016-01-05 MED ORDER — CEPHALEXIN 500 MG PO CAPS: 500.0000 mg | ORAL_CAPSULE | Freq: Two times a day (BID) | ORAL | 0 refills | Status: DC

## 2016-01-05 MED ORDER — DIVALPROEX SODIUM ER 500 MG PO TB24: 500.0000 mg | ORAL_TABLET | Freq: Every day | ORAL | 0 refills | Status: DC

## 2016-01-05 MED ORDER — LEVOTHYROXINE SODIUM 25 MCG PO TABS: 25.0000 ug | ORAL_TABLET | Freq: Every day | ORAL | 0 refills | Status: AC

## 2016-01-05 MED ORDER — HYDROCODONE-ACETAMINOPHEN 5-325 MG PO TABS: 1.0000 | ORAL_TABLET | ORAL | 0 refills | Status: DC | PRN

## 2016-01-05 MED ORDER — RISPERIDONE 0.5 MG PO TABS: 0.5000 mg | ORAL_TABLET | Freq: Two times a day (BID) | ORAL | 0 refills | Status: DC

## 2016-01-05 NOTE — Discharge Summary (Signed)
Physician Discharge Summary  [name] ZOX:[id number] DOB: 10/12/1920 DOA: 01/01/2016  PCP: Shirline Freesory Nafziger, NP  Admit date: 01/01/2016 Discharge date: 01/05/2016  Admitted From: Home Disposition:  SNF  Recommendations for Outpatient Follow-up:  1. Follow up with PCP in 1-2 weeks 2. Please obtain BMP/CBC in one week 3. Please follow up on the following pending results:  Home Health: No Equipment/Devices: None  Discharge Condition: Stable CODE STATUS: DNR Diet recommendation: Heart Healthy / Carb Modified / Regular / Dysphagia   Brief/Interim Summary: Kathryn CassetteRuth M Braunis a 82 y.o.femalewith medical history significant of Diastolic CHF, CAD, dementia and advanced age. She was brought to the hospital because of generalized weakness, her some reported she was diagnosed with UTI 3 days ago was on Keflex. Her son found her on the floor laying on her right side covered in urine and stool and couldn't get up. He also reported that she has been having hand tremor does have been prominent since infection started. Of note some reported she has been more forgetful lately, hand tremor at rest, micrographia, slow movements and shuffling gait. Which seemed to more prominent after she started to get sick several days ago. Improved today but BP was elevated. Per son she appeared more lucid. She will be d/c'd to SNF as she has been deemed stable for D/C and follow up care and Physical Therapy at SNF.   Discharge Diagnoses:  Principal Problem:   UTI (urinary tract infection) Active Problems:   Hypothyroidism   Memory loss   Troponin I above reference range   General weakness   Dementia without behavioral disturbance  UTI poA -Recently diagnosed with UTI started on Keflex, urinalysis still shows multiple plus cells.  -No cultures ordered and patient was on prior antibiotic -Rocephin for Abx while in the Hospital. Will finish out Abx Course with po Keflex.   Generalized weakness likely from  deconditiioing from UTI - ? postictal state patient with history of seizures in the past, patient had an episode of urinary and stool incontinence, and son report tremors in hands and face of unknown origin. -We'll monitor his tremors continue consider ordering an EEG if continues; No tremors today or yesterday -C/w Depakote ER 500 mg po BID  -PT/OT to evaluate and treat. -Recommendation for SNF  Elevated troponin - trending down, likely secondary to infectious process -Troponin of 0.15, patient denies any chest pain, EKG without ischemic findings. -Continue aspirin and Coreg -No further evaluation needed at this point  Hypothyroidism - clinically euthyroid TSH normal -Continue with Levothyroxine 25 mcg  Dementia - some signs and symptoms of Parkinson - sundowning/hospital delirium -? Component of Vascular Dementia vs. Lewy Body Dementia -PT/OT recommended SNF. -Continue with Doepezil 10 mg po qHS -Started on Risperdal 0.365milligrams twice a day -D/C'd Haldol 0.5 -Patient has a Neurologist that she follow-up with, this as follow-up as an outpatient  Hypertension -Continue with Home Medications -Will D/C Clonidine  Discharge Instructions  Discharge Instructions    Call MD for:  difficulty breathing, headache or visual disturbances    Complete by:  As directed    Call MD for:  persistant nausea and vomiting    Complete by:  As directed    Call MD for:  temperature >100.4    Complete by:  As directed    Diet - low sodium heart healthy    Complete by:  As directed    Discharge instructions    Complete by:  As directed    Follow Up Care  at [facility].   Increase activity slowly    Complete by:  As directed        Medication List    STOP taking these medications   cloNIDine 0.1 MG tablet Commonly known as:  CATAPRES   lactulose (encephalopathy) 10 GM/15ML Soln Commonly known as:  CHRONULAC     TAKE these medications   amLODipine 2.5 MG tablet Commonly  known as:  NORVASC Take 1 tablet (2.5 mg total) by mouth daily.   aspirin EC 81 MG tablet Take 162 mg by mouth daily.   atorvastatin 20 MG tablet Commonly known as:  LIPITOR Take 1 tablet (20 mg total) by mouth daily.   carvedilol 6.25 MG tablet Commonly known as:  COREG Take 1 tablet (6.25 mg total) by mouth 2 (two) times daily.   cephALEXin 500 MG capsule Commonly known as:  KEFLEX Take 1 capsule (500 mg total) by mouth 2 (two) times daily.   divalproex 500 MG 24 hr tablet Commonly known as:  DEPAKOTE ER Take 1 tablet (500 mg total) by mouth at bedtime.   donepezil 10 MG tablet Commonly known as:  ARICEPT Take 1 tablet (10 mg total) by mouth at bedtime.   HYDROcodone-acetaminophen 5-325 MG tablet Commonly known as:  NORCO/VICODIN Take 1-2 tablets by mouth every 4 (four) hours as needed for moderate pain.   levothyroxine 25 MCG tablet Commonly known as:  SYNTHROID, LEVOTHROID Take 1 tablet (25 mcg total) by mouth daily before breakfast.   nitroGLYCERIN 0.4 MG SL tablet Commonly known as:  NITROSTAT Place 0.4 mg under the tongue every 5 (five) minutes as needed for chest pain (3 DOSES MAX).   risperiDONE 0.5 MG tablet Commonly known as:  RISPERDAL Take 1 tablet (0.5 mg total) by mouth 2 (two) times daily.   sertraline 25 MG tablet Commonly known as:  ZOLOFT Take 25 mg by mouth daily.   valsartan 160 MG tablet Commonly known as:  DIOVAN Take 1 tablet (160 mg total) by mouth 2 (two) times daily.   Vitamin D (Ergocalciferol) 50000 units Caps capsule Commonly known as:  DRISDOL TAKE ONE CAPSULE BY MOUTH WEEKLY       No Known Allergies  Consultations:  None  Procedures/Studies: Dg Shoulder Right  Result Date: 01/01/2016 CLINICAL DATA:  Fall. EXAM: RIGHT SHOULDER - 2+ VIEW COMPARISON:  None. FINDINGS: Visualized portion of the right hemithorax is normal. No acute fracture or dislocation. Degenerative irregularity of the rotator cuff insertion. Mild  acromioclavicular joint degenerative change as well. IMPRESSION: No acute osseous abnormality. Electronically Signed   By: Jeronimo Greaves M.D.   On: 01/01/2016 10:38   Ct Head Wo Contrast  Result Date: 01/01/2016 CLINICAL DATA:  Fall, favoring LEFT leg, history hypertension, coronary artery disease, CHF, stroke, former smoker EXAM: CT HEAD WITHOUT CONTRAST TECHNIQUE: Contiguous axial images were obtained from the base of the skull through the vertex without intravenous contrast. COMPARISON:  10/05/2015 FINDINGS: Brain: Generalized atrophy. Normal ventricular morphology. No midline shift or mass effect. Small vessel chronic ischemic changes of deep cerebral white matter. No intracranial hemorrhage, mass lesion, evidence of acute infarction, or extra-axial fluid collection. Vascular: Atherosclerotic calcifications at the carotid siphons Skull: Intact Sinuses/Orbits: Opacified RIGHT maxillary sinus Other: N/A IMPRESSION: Atrophy with small vessel chronic ischemic changes of deep cerebral white matter. No acute intracranial abnormalities. Electronically Signed   By: Ulyses Southward M.D.   On: 01/01/2016 10:11   Dg Chest Port 1 View  Result Date: 01/01/2016 CLINICAL DATA:  Fall.  Mental status changes. EXAM: PORTABLE CHEST 1 VIEW COMPARISON:  06/25/2008 FINDINGS: Midline trachea. Normal heart size. Atherosclerosis in the transverse aorta. No pleural effusion or pneumothorax. Mild scarring at the left lung base laterally. IMPRESSION: No acute cardiopulmonary disease. Aortic atherosclerosis. Electronically Signed   By: Jeronimo GreavesKyle  Talbot M.D.   On: 01/01/2016 10:34     Subjective: Seen and examined at bedside and woke her up from her sleep. She was doing well. No active complaints or concerns. Denied CP/SOB/N/V.   Discharge Exam: Vitals:   01/04/16 2106 01/05/16 0500  BP: (!) 101/58 (!) 153/41  Pulse: 75 60  Resp: 18 20  Temp: 98.3 F (36.8 C) (!) 96.8 F (36 C)   Vitals:   01/04/16 0945 01/04/16 1345 01/04/16  2106 01/05/16 0500  BP: (!) 148/49 (!) 122/46 (!) 101/58 (!) 153/41  Pulse:  78 75 60  Resp:  18 18 20   Temp:  98.9 F (37.2 C) 98.3 F (36.8 C) (!) 96.8 F (36 C)  TempSrc:  Oral Axillary Axillary  SpO2:  98% 98% 100%  Weight:      Height:       General: Pt is awake, not in acute distress Cardiovascular: RRR, S1/S2 +, no rubs, no gallops, 2/6 Systolic Murmur appreciated. Respiratory: CTA bilaterally, no wheezing, no rhonchi Abdominal: Soft, NT, ND, bowel sounds + Extremities: no edema, no cyanosis  The results of significant diagnostics from this hospitalization (including imaging, microbiology, ancillary and laboratory) are listed below for reference.     Microbiology: No results found for this or any previous visit (from the past 240 hour(s)).   Labs: BNP (last 3 results) No results for input(s): BNP in the last 8760 hours. Basic Metabolic Panel:  Recent Labs Lab 01/01/16 1026 01/02/16 0313 01/03/16 0320 01/04/16 0335 01/05/16 0327  NA 137 136 138 136 136  K 3.8 3.0* 3.6 3.8 3.6  CL 101 102 104 100* 101  CO2 27 26 26 28 27   GLUCOSE 99 94 98 102* 101*  BUN 27* 35* 21* 19 17  CREATININE 1.15* 1.34* 0.96 1.02* 1.06*  CALCIUM 9.3 8.3* 8.5* 8.5* 8.8*   Liver Function Tests:  Recent Labs Lab 01/01/16 1026  AST 58*  ALT 36  ALKPHOS 56  BILITOT 0.9  PROT 6.7  ALBUMIN 3.2*   No results for input(s): LIPASE, AMYLASE in the last 168 hours. No results for input(s): AMMONIA in the last 168 hours. CBC:  Recent Labs Lab 01/01/16 1026 01/02/16 0313 01/03/16 0320 01/04/16 0335 01/05/16 0327  WBC 11.5* 8.0 7.0 6.8 6.6  NEUTROABS 8.4*  --   --   --   --   HGB 12.2 10.5* 11.4* 11.1* 11.2*  HCT 36.2 30.7* 33.1* 33.3* 33.2*  MCV 93.3 93.6 93.8 94.1 94.1  PLT 164 141* 156 167 165   Cardiac Enzymes:  Recent Labs Lab 01/01/16 1026 01/01/16 1421 01/01/16 1959  CKTOTAL 658*  --   --   TROPONINI 0.15* 0.12* 0.10*   BNP: Invalid input(s): POCBNP CBG: No  results for input(s): GLUCAP in the last 168 hours. D-Dimer No results for input(s): DDIMER in the last 72 hours. Hgb A1c No results for input(s): HGBA1C in the last 72 hours. Lipid Profile No results for input(s): CHOL, HDL, LDLCALC, TRIG, CHOLHDL, LDLDIRECT in the last 72 hours. Thyroid function studies No results for input(s): TSH, T4TOTAL, T3FREE, THYROIDAB in the last 72 hours.  Invalid input(s): FREET3 Anemia work up No results for input(s): VITAMINB12, FOLATE,  FERRITIN, TIBC, IRON, RETICCTPCT in the last 72 hours. Urinalysis    Component Value Date/Time   COLORURINE YELLOW 01/01/2016 0953   APPEARANCEUR CLEAR 01/01/2016 0953   LABSPEC 1.015 01/01/2016 0953   PHURINE 7.5 01/01/2016 0953   GLUCOSEU NEGATIVE 01/01/2016 0953   HGBUR TRACE (A) 01/01/2016 0953   BILIRUBINUR NEGATIVE 01/01/2016 0953   BILIRUBINUR Neg 12/29/2015 1523   KETONESUR NEGATIVE 01/01/2016 0953   PROTEINUR 30 (A) 01/01/2016 0953   UROBILINOGEN negative 12/29/2015 1523   UROBILINOGEN 0.2 04/27/2008 1300   NITRITE NEGATIVE 01/01/2016 0953   LEUKOCYTESUR TRACE (A) 01/01/2016 0953   Sepsis Labs Invalid input(s): PROCALCITONIN,  WBC,  LACTICIDVEN Microbiology No results found for this or any previous visit (from the past 240 hour(s)).  Time coordinating discharge: Over 30 minutes  SIGNED:  Merlene Laughter, DO [facility] 01/05/2016, 11:19 AM Pager [phone]  If 7PM-7AM, please contact night-coverage [email or web address] Password [email or web address]

## 2016-01-05 NOTE — Clinical Social Work Placement (Signed)
Patient is set to discharge to [facility] today. Patient & son, [name] at bedside made aware. Discharge packet given to RN, Amil AmenJulia. [facility] called for transport.     [name] [name], LCSW [facility] Clinical Social Worker cell #: [phone]    CLINICAL SOCIAL WORK PLACEMENT  NOTE  Date:  01/05/2016  Patient Details  Name: Kathryn KidRuth M Grillot MRN: [id number] Date of Birth: 08/22/1920  Clinical Social Work is seeking post-discharge placement for this patient at the Skilled  Nursing Facility level of care (*CSW will initial, date and re-position this form in  chart as items are completed):  Yes   Patient/family provided with [place] Clinical Social Work Department's list of facilities offering this level of care within the geographic area requested by the patient (or if unable, by the patient's family).  Yes   Patient/family informed of their freedom to choose among providers that offer the needed level of care, that participate in Medicare, Medicaid or managed care program needed by the patient, have an available bed and are willing to accept the patient.  Yes   Patient/family informed of [facility]'s ownership interest in [facility] and [facility], as well as of the fact that they are under no obligation to receive care at these facilities.  PASRR submitted to EDS on 01/03/16     PASRR number received on 01/03/16     Existing PASRR number confirmed on       FL2 transmitted to all facilities in geographic area requested by pt/family on 01/03/16     FL2 transmitted to all facilities within larger geographic area on       Patient informed that his/her managed care company has contracts with or will negotiate with certain facilities, including the following:        Yes   Patient/family informed of bed offers received.  [facility] bed [facility]     Physician recommends and patient chooses bed at      Patient to be [facility]  on 01/05/16.  Patient to be transferred to facility by [facility]     Patient family notified on 01/05/16 of transfer.  Name of family member notified:  patient's son at bedside     PHYSICIAN       Additional Comment:    _______________________________________________ Arlyss RepressHarrison, [name], LCSW 01/05/2016, 1:13 PM

## 2016-01-09 ENCOUNTER — Ambulatory Visit: Payer: Medicare Other | Admitting: Nurse Practitioner

## 2016-01-09 ENCOUNTER — Non-Acute Institutional Stay (SKILLED_NURSING_FACILITY): Payer: Medicare Other | Admitting: Internal Medicine

## 2016-01-09 ENCOUNTER — Encounter: Payer: Self-pay | Admitting: Internal Medicine

## 2016-01-09 DIAGNOSIS — N183 Chronic kidney disease, stage 3 unspecified: Secondary | ICD-10-CM

## 2016-01-09 DIAGNOSIS — G3183 Dementia with Lewy bodies: Secondary | ICD-10-CM

## 2016-01-09 DIAGNOSIS — R5381 Other malaise: Secondary | ICD-10-CM

## 2016-01-09 DIAGNOSIS — I10 Essential (primary) hypertension: Secondary | ICD-10-CM | POA: Diagnosis not present

## 2016-01-09 DIAGNOSIS — E039 Hypothyroidism, unspecified: Secondary | ICD-10-CM

## 2016-01-09 DIAGNOSIS — D638 Anemia in other chronic diseases classified elsewhere: Secondary | ICD-10-CM | POA: Diagnosis not present

## 2016-01-09 DIAGNOSIS — F0281 Dementia in other diseases classified elsewhere with behavioral disturbance: Secondary | ICD-10-CM

## 2016-01-09 DIAGNOSIS — N3000 Acute cystitis without hematuria: Secondary | ICD-10-CM

## 2016-01-09 DIAGNOSIS — I2510 Atherosclerotic heart disease of native coronary artery without angina pectoris: Secondary | ICD-10-CM | POA: Diagnosis not present

## 2016-01-09 NOTE — Progress Notes (Signed)
LOCATION: [facility]  PCP: Shirline Frees, NP   Code Status: DNR  Goals of care: Advanced Directive information Advanced Directives 01/09/2016  Does patient have an advance directive? Yes  Type of Advance Directive Out of facility DNR (pink MOST or yellow form)  Does patient want to make changes to advanced directive? No - Patient declined  Copy of advanced directive(s) in chart? Yes  Would patient like information on creating an advanced directive? -       Extended Emergency Contact Information Primary Emergency Contact: Duhon,Richard Address: [address]          [place]  [place] Home Phone: [phone] Relation: Son   No Known Allergies  Chief Complaint  Patient presents with  . New Admit To SNF    New Admission Visit     HPI:  Patient is a 82 y.o. female seen today for short term rehabilitation post hospital admission from 01/01/2016-01/05/2016 with generalized weakness. She was diagnosed with urinary tract infection and started on antibiotics. She has medical history of coronary artery disease, congestive heart failure, dementia among others. She is seen in her room today.  Review of Systems:  Constitutional: Negative for fever, chills, diaphoresis.  HENT: Negative for headache, congestion, nasal discharge. Respiratory: Negative for cough, shortness of breath.   Cardiovascular: Negative for chest pain.  Gastrointestinal: Negative for heartburn, nausea, vomiting, abdominal pain. Last bowel movement was this morning. She is incontinent with her bowel.  Genitourinary: Negative for dysuria.  Musculoskeletal: Positive for fall this am when she slid out of her chair. No acute injury reported.  Skin: Negative for itching, rash.  Psychiatric/Behavioral: positive for memory loss.    Past Medical History:  Diagnosis Date  . CHF (congestive heart failure) (HCC)    Diastolic CHF. Echo (3/10) EF 45-50% with periapical akinesis. Mild  LVH. Mild MR. Pseudonormal diastolic function. Echo (4/10): EF 65% with mild focal basal septal hypertrophy. Pseudonormal diastolic function. Normal wall motion. Moderate biatrial enlargement. PASP 61 mmHg. Echo (4/11): EF 55%, inferobasal hypokinesis, mild MR, PA systolic pressure 44 mmHg.  [name] Coronary artery disease    NSTEMI 3/10. LHC showed 80% mLAD, 90% mCFX. She had PCI with Endeavor 2.5 x 12 to LAD and Endeavor 3.0 x 12 to CFX  . Dementia   . Expressive aphasia syndrome    More likely atypical migraine than TIA. Carotid dopplers (8/09) with 40-60% LICA stenosis.  . Frequent falls   . Horner's syndrome    left  . Hypertension   . Hypothyroidism   . Lacunar stroke (HCC)    on CT  . SVT (supraventricular tachycardia) (HCC)    transient   No past surgical history on file. Social History:   reports that she quit smoking about 65 years ago. Her smoking use included Cigarettes. She has never used smokeless tobacco. She reports that she does not drink alcohol or use drugs.  Family History  Problem Relation Age of Onset  . Hypertension Mother   . COPD Father   . Heart attack Neg Hx     Medications:   Medication List       Accurate as of 01/09/16  1:14 PM. Always use your most recent med list.          amLODipine 2.5 MG tablet Commonly known as:  NORVASC Take 1 tablet (2.5 mg total) by mouth daily.   aspirin EC 81 MG tablet Take 162 mg by mouth daily.   atorvastatin 20  MG tablet Commonly known as:  LIPITOR Take 1 tablet (20 mg total) by mouth daily.   carvedilol 6.25 MG tablet Commonly known as:  COREG Take 1 tablet (6.25 mg total) by mouth 2 (two) times daily.   cephALEXin 500 MG capsule Commonly known as:  KEFLEX Take 1 capsule (500 mg total) by mouth 2 (two) times daily.   divalproex 500 MG 24 hr tablet Commonly known as:  DEPAKOTE ER Take 1 tablet (500 mg total) by mouth at bedtime.   donepezil 10 MG tablet Commonly known as:  ARICEPT Take 1 tablet (10 mg  total) by mouth at bedtime.   HYDROcodone-acetaminophen 5-325 MG tablet Commonly known as:  NORCO/VICODIN Take 1-2 tablets by mouth every 4 (four) hours as needed for moderate pain.   levothyroxine 25 MCG tablet Commonly known as:  SYNTHROID, LEVOTHROID Take 1 tablet (25 mcg total) by mouth daily before breakfast.   nitroGLYCERIN 0.4 MG SL tablet Commonly known as:  NITROSTAT Place 0.4 mg under the tongue every 5 (five) minutes as needed for chest pain (3 DOSES MAX).   risperiDONE 0.5 MG tablet Commonly known as:  RISPERDAL Take 1 tablet (0.5 mg total) by mouth 2 (two) times daily.   sertraline 25 MG tablet Commonly known as:  ZOLOFT Take 25 mg by mouth daily.   valsartan 160 MG tablet Commonly known as:  DIOVAN Take 1 tablet (160 mg total) by mouth 2 (two) times daily.   Vitamin D (Ergocalciferol) 50000 units Caps capsule Commonly known as:  DRISDOL TAKE ONE CAPSULE BY MOUTH WEEKLY       Immunizations: Immunization History  Administered Date(s) Administered  . Influenza Split 11/26/2011  . Influenza Whole 11/27/1998, 12/30/2006, 12/05/2007, 01/17/2009, 01/10/2010  . Influenza, High Dose Seasonal PF 03/22/2015  . Influenza,inj,Quad PF,36+ Mos 01/28/2013, 01/02/2016  . PPD Test 01/05/2016  . Pneumococcal Conjugate-13 03/22/2015  . Pneumococcal Polysaccharide-23 02/26/2005  . Td 02/26/2005     Physical Exam:  Vitals:   01/09/16 1310  BP: 122/64  Pulse: 91  Resp: 20  Temp: 98.5 F (36.9 C)  TempSrc: Oral  SpO2: 95%  Weight: 112 lb (50.8 kg)  Height: 4\' 10"  (1.473 m)   Body mass index is 23.41 kg/m.  General.-elderly frail female in no acute distress HEENT-moist mucous membrane, no cervical lymphadenopathy Cardiovascular-regular rate and rhythm, normal S1-S2, systolic murmur present Respiratory-clear to auscultation bilaterally, no wheezing, no rhonchi Abdomen-soft, nontender, nondistended, bowel sounds present Extremities-no leg edema, able to move  all 4 extremities, on wheelchair Skin-bruises to both arms present Neurological-alert and oriented to self only Psychiatric-normal mood and affect, pleasantly confused   Labs reviewed: Basic Metabolic Panel:  Recent Labs  16/11/9609/07/17 0320 01/04/16 0335 01/05/16 0327  NA 138 136 136  K 3.6 3.8 3.6  CL 104 100* 101  CO2 26 28 27   GLUCOSE 98 102* 101*  BUN 21* 19 17  CREATININE 0.96 1.02* 1.06*  CALCIUM 8.5* 8.5* 8.8*   Liver Function Tests:  Recent Labs  09/12/15 1019 01/01/16 1026  AST 17 58*  ALT 13 36  ALKPHOS 63 56  BILITOT 0.5 0.9  PROT 6.5 6.7  ALBUMIN 3.6 3.2*   No results for input(s): LIPASE, AMYLASE in the last 8760 hours. No results for input(s): AMMONIA in the last 8760 hours. CBC:  Recent Labs  10/04/15 0008 10/12/15 1558 01/01/16 1026  01/03/16 0320 01/04/16 0335 01/05/16 0327  WBC 7.2 7.7 11.5*  < > 7.0 6.8 6.6  NEUTROABS 4.2 4.2 8.4*  --   --   --   --  HGB 9.8* 10.4* 12.2  < > 11.4* 11.1* 11.2*  HCT 29.1* 30.1* 36.2  < > 33.1* 33.3* 33.2*  MCV 92.1 91.0 93.3  < > 93.8 94.1 94.1  PLT 171 223.0 164  < > 156 167 165  < > = values in this interval not displayed. Cardiac Enzymes:  Recent Labs  01/01/16 1026 01/01/16 1421 01/01/16 1959  CKTOTAL 658*  --   --   TROPONINI 0.15* 0.12* 0.10*   BNP: Invalid input(s): POCBNP CBG:  Recent Labs  09/12/15 1244  GLUCAP 111*    Radiological Exams: Dg Shoulder Right  Result Date: 01/01/2016 CLINICAL DATA:  Fall. EXAM: RIGHT SHOULDER - 2+ VIEW COMPARISON:  None. FINDINGS: Visualized portion of the right hemithorax is normal. No acute fracture or dislocation. Degenerative irregularity of the rotator cuff insertion. Mild acromioclavicular joint degenerative change as well. IMPRESSION: No acute osseous abnormality. Electronically Signed   By: Jeronimo GreavesKyle  Talbot M.D.   On: 01/01/2016 10:38   Ct Head Wo Contrast  Result Date: 01/01/2016 CLINICAL DATA:  Fall, favoring LEFT leg, history hypertension,  coronary artery disease, CHF, stroke, former smoker EXAM: CT HEAD WITHOUT CONTRAST TECHNIQUE: Contiguous axial images were obtained from the base of the skull through the vertex without intravenous contrast. COMPARISON:  10/05/2015 FINDINGS: Brain: Generalized atrophy. Normal ventricular morphology. No midline shift or mass effect. Small vessel chronic ischemic changes of deep cerebral white matter. No intracranial hemorrhage, mass lesion, evidence of acute infarction, or extra-axial fluid collection. Vascular: Atherosclerotic calcifications at the carotid siphons Skull: Intact Sinuses/Orbits: Opacified RIGHT maxillary sinus Other: N/A IMPRESSION: Atrophy with small vessel chronic ischemic changes of deep cerebral white matter. No acute intracranial abnormalities. Electronically Signed   By: Ulyses SouthwardMark  Boles M.D.   On: 01/01/2016 10:11   Dg Chest Port 1 View  Result Date: 01/01/2016 CLINICAL DATA:  Fall.  Mental status changes. EXAM: PORTABLE CHEST 1 VIEW COMPARISON:  06/25/2008 FINDINGS: Midline trachea. Normal heart size. Atherosclerosis in the transverse aorta. No pleural effusion or pneumothorax. Mild scarring at the left lung base laterally. IMPRESSION: No acute cardiopulmonary disease. Aortic atherosclerosis. Electronically Signed   By: Jeronimo GreavesKyle  Talbot M.D.   On: 01/01/2016 10:34    Assessment/Plan  Physical deconditioning Will have her work with physical therapy and occupational therapy to help regain her strength and balance. Fall precautions to be taken.  Urinary tract infection Continue and complete her course of oral Keflex on 01/11/16. Encourage hydration. Fall precautions to be taken.  Anemia of chronic disease Monitor CBC periodically   Dementia with behavioral disturbance Continue donepezil, Risperdal and Depakote extended release 500 mg twice a day. Provide supportive care. Fall precautions to be taken. Pressure ulcer prophylaxis to be taken. Patient had required Haldol in the hospital. To  follow-up with neurology.SLP to follow.   Hypothyroidism Continue levothyroxine  Coronary artery disease Chest pain-free. Concern for ischemic event in the hospital in the setting of infection and dehydration. Continue aspirin, valsartan and Coreg for now. Continue atorvastatin.   Hypertension Continue Norvasc 2.5 mg daily, valsartan and Coreg 6.25 mg twice a day. Monitor blood pressure readings. Check BMP.  ckd 3  Monitor bmp   Goals of care: short term rehabilitation   Labs/tests ordered: cbc, bmp 01/11/16  Family/ staff Communication: reviewed care plan with patient and nursing supervisor    Oneal GroutMAHIMA Caydn Justen, MD Internal Medicine [facility] [address] [place], [place] [address] Cell Phone (Monday-Friday 8 am - 5 pm): [phone] On  Call: [phone] and follow prompts after 5 pm and on weekends Office Phone: [phone] Office Fax: [phone]

## 2016-01-10 ENCOUNTER — Ambulatory Visit: Payer: Medicare Other

## 2016-01-12 ENCOUNTER — Non-Acute Institutional Stay (SKILLED_NURSING_FACILITY): Payer: Medicare Other | Admitting: Internal Medicine

## 2016-01-12 ENCOUNTER — Encounter: Payer: Self-pay | Admitting: Internal Medicine

## 2016-01-12 DIAGNOSIS — N183 Chronic kidney disease, stage 3 unspecified: Secondary | ICD-10-CM

## 2016-01-12 DIAGNOSIS — I952 Hypotension due to drugs: Secondary | ICD-10-CM | POA: Diagnosis not present

## 2016-01-12 DIAGNOSIS — F0281 Dementia in other diseases classified elsewhere with behavioral disturbance: Secondary | ICD-10-CM

## 2016-01-12 DIAGNOSIS — R001 Bradycardia, unspecified: Secondary | ICD-10-CM | POA: Diagnosis not present

## 2016-01-12 DIAGNOSIS — G9340 Encephalopathy, unspecified: Secondary | ICD-10-CM

## 2016-01-12 DIAGNOSIS — Z9181 History of falling: Secondary | ICD-10-CM | POA: Diagnosis not present

## 2016-01-12 DIAGNOSIS — D638 Anemia in other chronic diseases classified elsewhere: Secondary | ICD-10-CM

## 2016-01-12 DIAGNOSIS — G308 Other Alzheimer's disease: Secondary | ICD-10-CM | POA: Diagnosis not present

## 2016-01-12 DIAGNOSIS — G309 Alzheimer's disease, unspecified: Secondary | ICD-10-CM

## 2016-01-12 LAB — BASIC METABOLIC PANEL
BUN: 41 mg/dL — AB (ref 4–21)
Creatinine: 1.3 mg/dL — AB (ref 0.5–1.1)
GLUCOSE: 86 mg/dL
Potassium: 4.3 mmol/L (ref 3.4–5.3)
Sodium: 138 mmol/L (ref 137–147)

## 2016-01-12 LAB — CBC AND DIFFERENTIAL
HCT: 28 % — AB (ref 36–46)
HEMOGLOBIN: 9.4 g/dL — AB (ref 12.0–16.0)
PLATELETS: 133 10*3/uL — AB (ref 150–399)
WBC: 7.6 10*3/mL

## 2016-01-12 NOTE — Progress Notes (Signed)
LOCATION: [facility]  PCP: Shirline Frees, NP   Code Status: DNR  Goals of care: Advanced Directive information Advanced Directives 01/09/2016  Does patient have an advance directive? Yes  Type of Advance Directive Out of facility DNR (pink MOST or yellow form)  Does patient want to make changes to advanced directive? No - Patient declined  Copy of advanced directive(s) in chart? Yes  Would patient like information on creating an advanced directive? -       Extended Emergency Contact Information Primary Emergency Contact: Bertram,Richard Address: [address]          [place] Home Phone: [phone] Relation: Son   No Known Allergies  Chief Complaint  Patient presents with  . Acute Visit    Family Concerns     HPI:  Patient is a 82 y.o. female seen today for acute concern from family. Son is concerned about her having increased confusion lately and is concerned of her having another UTI. She completed recent antibiotic course for UTI yesterday. Se is here for short term rehabilitation post hospital admission with generalized weakness and deconditioning from UTI. She has history of dementia. Per nursing staff, she is pleasantly confused and no agitation episode reported. She had a fall 3 days back trying to get out of chair unassisted.   Review of Systems: limited with her dementia Constitutional: Negative for fever, chills HENT: Negative for headache, congestion, nasal discharge Respiratory: Negative for cough, shortness of breath  Cardiovascular: Negative for chest pain Gastrointestinal: Negative for heartburn, nausea, vomiting, abdominal pain.  Genitourinary: Negative for dysuria Skin: Negative for itching, rash.  Neurological: Negative for dizziness.      Past Medical History:  Diagnosis Date  . CHF (congestive heart failure) (HCC)    Diastolic CHF. Echo (3/10) EF 45-50% with periapical akinesis. Mild LVH. Mild MR.  Pseudonormal diastolic function. Echo (4/10): EF 65% with mild focal basal septal hypertrophy. Pseudonormal diastolic function. Normal wall motion. Moderate biatrial enlargement. PASP 61 mmHg. Echo (4/11): EF 55%, inferobasal hypokinesis, mild MR, PA systolic pressure 44 mmHg.  [name] Coronary artery disease    NSTEMI 3/10. LHC showed 80% mLAD, 90% mCFX. She had PCI with Endeavor 2.5 x 12 to LAD and Endeavor 3.0 x 12 to CFX  . Dementia   . Expressive aphasia syndrome    More likely atypical migraine than TIA. Carotid dopplers (8/09) with 40-60% LICA stenosis.  . Frequent falls   . Horner's syndrome    left  . Hypertension   . Hypothyroidism   . Lacunar stroke (HCC)    on CT  . SVT (supraventricular tachycardia) (HCC)    transient   No past surgical history on file. Social History:   reports that she quit smoking about 65 years ago. Her smoking use included Cigarettes. She has never used smokeless tobacco. She reports that she does not drink alcohol or use drugs.  Family History  Problem Relation Age of Onset  . Hypertension Mother   . COPD Father   . Heart attack Neg Hx     Medications:   Medication List       Accurate as of 01/12/16 12:02 PM. Always use your most recent med list.          amLODipine 2.5 MG tablet Commonly known as:  NORVASC Take 1 tablet (2.5 mg total) by mouth daily.   aspirin EC 81 MG tablet Take 162 mg by mouth daily.   atorvastatin 20  MG tablet Commonly known as:  LIPITOR Take 1 tablet (20 mg total) by mouth daily.   carvedilol 6.25 MG tablet Commonly known as:  COREG Take 1 tablet (6.25 mg total) by mouth 2 (two) times daily.   divalproex 500 MG 24 hr tablet Commonly known as:  DEPAKOTE ER Take 1 tablet (500 mg total) by mouth at bedtime.   donepezil 10 MG tablet Commonly known as:  ARICEPT Take 1 tablet (10 mg total) by mouth at bedtime.   HYDROcodone-acetaminophen 5-325 MG tablet Commonly known as:  NORCO/VICODIN Take 1-2 tablets by  mouth every 4 (four) hours as needed for moderate pain.   levothyroxine 25 MCG tablet Commonly known as:  SYNTHROID, LEVOTHROID Take 1 tablet (25 mcg total) by mouth daily before breakfast.   nitroGLYCERIN 0.4 MG SL tablet Commonly known as:  NITROSTAT Place 0.4 mg under the tongue every 5 (five) minutes as needed for chest pain (3 DOSES MAX).   risperiDONE 0.5 MG tablet Commonly known as:  RISPERDAL Take 1 tablet (0.5 mg total) by mouth 2 (two) times daily.   sertraline 25 MG tablet Commonly known as:  ZOLOFT Take 25 mg by mouth daily.   valsartan 160 MG tablet Commonly known as:  DIOVAN Take 1 tablet (160 mg total) by mouth 2 (two) times daily.   Vitamin D (Ergocalciferol) 50000 units Caps capsule Commonly known as:  DRISDOL TAKE ONE CAPSULE BY MOUTH WEEKLY       Immunizations: Immunization History  Administered Date(s) Administered  . Influenza Split 11/26/2011  . Influenza Whole 11/27/1998, 12/30/2006, 12/05/2007, 01/17/2009, 01/10/2010  . Influenza, High Dose Seasonal PF 03/22/2015  . Influenza,inj,Quad PF,36+ Mos 01/28/2013, 01/02/2016  . PPD Test 01/05/2016  . Pneumococcal Conjugate-13 03/22/2015  . Pneumococcal Polysaccharide-23 02/26/2005  . Td 02/26/2005     Physical Exam: Vitals:   01/12/16 1153  BP: (!) 109/54  Pulse: (!) 54  Resp: 16  Temp: 99 F (37.2 C)  TempSrc: Oral  SpO2: 99%  Weight: 111 lb (50.3 kg)  Height: 4\' 10"  (1.473 m)   Body mass index is 23.2 kg/m.  General- elderly female, frail and thin built, in no acute distress Head- normocephalic, atraumatic Throat- moist mucus membrane Eyes- no pallor, no icterus, no discharge, normal conjunctiva, normal sclera Neck- no cervical lymphadenopathy Cardiovascular- normal s1,s2, no murmur, no leg edema Respiratory- bilateral clear to auscultation Abdomen- bowel sounds present, soft, non tender, no guarding or rigidity, no CVA tenderness Musculoskeletal- able to move all 4 extremities,  generalized weakness, on wheelchair Neurological- alert and oriented to person Skin- warm and dry Psychiatry- calm with normal affect this visit, poor insight   Labs reviewed: Basic Metabolic Panel:  Recent Labs  40/98/1109/09/11 0320 01/04/16 0335 01/05/16 0327 01/12/16  NA 138 136 136 138  K 3.6 3.8 3.6 4.3  CL 104 100* 101  --   CO2 26 28 27   --   GLUCOSE 98 102* 101*  --   BUN 21* 19 17 41*  CREATININE 0.96 1.02* 1.06* 1.3*  CALCIUM 8.5* 8.5* 8.8*  --    Liver Function Tests:  Recent Labs  09/12/15 1019 01/01/16 1026  AST 17 58*  ALT 13 36  ALKPHOS 63 56  BILITOT 0.5 0.9  PROT 6.5 6.7  ALBUMIN 3.6 3.2*   No results for input(s): LIPASE, AMYLASE in the last 8760 hours. No results for input(s): AMMONIA in the last 8760 hours. CBC:  Recent Labs  10/04/15 0008 10/12/15 1558 01/01/16 1026  01/03/16  0320 01/04/16 0335 01/05/16 0327 01/12/16  WBC 7.2 7.7 11.5*  < > 7.0 6.8 6.6 7.6  NEUTROABS 4.2 4.2 8.4*  --   --   --   --   --   HGB 9.8* 10.4* 12.2  < > 11.4* 11.1* 11.2* 9.4*  HCT 29.1* 30.1* 36.2  < > 33.1* 33.3* 33.2* 28*  MCV 92.1 91.0 93.3  < > 93.8 94.1 94.1  --   PLT 171 223.0 164  < > 156 167 165 133*  < > = values in this interval not displayed. Cardiac Enzymes:  Recent Labs  01/01/16 1026 01/01/16 1421 01/01/16 1959  CKTOTAL 658*  --   --   TROPONINI 0.15* 0.12* 0.10*   BNP: Invalid input(s): POCBNP CBG:  Recent Labs  09/12/15 1244  GLUCAP 111*    Radiological Exams: Dg Shoulder Right  Result Date: 01/01/2016 CLINICAL DATA:  Fall. EXAM: RIGHT SHOULDER - 2+ VIEW COMPARISON:  None. FINDINGS: Visualized portion of the right hemithorax is normal. No acute fracture or dislocation. Degenerative irregularity of the rotator cuff insertion. Mild acromioclavicular joint degenerative change as well. IMPRESSION: No acute osseous abnormality. Electronically Signed   By: Jeronimo GreavesKyle  Talbot M.D.   On: 01/01/2016 10:38   Ct Head Wo Contrast  Result Date:  01/01/2016 CLINICAL DATA:  Fall, favoring LEFT leg, history hypertension, coronary artery disease, CHF, stroke, former smoker EXAM: CT HEAD WITHOUT CONTRAST TECHNIQUE: Contiguous axial images were obtained from the base of the skull through the vertex without intravenous contrast. COMPARISON:  10/05/2015 FINDINGS: Brain: Generalized atrophy. Normal ventricular morphology. No midline shift or mass effect. Small vessel chronic ischemic changes of deep cerebral white matter. No intracranial hemorrhage, mass lesion, evidence of acute infarction, or extra-axial fluid collection. Vascular: Atherosclerotic calcifications at the carotid siphons Skull: Intact Sinuses/Orbits: Opacified RIGHT maxillary sinus Other: N/A IMPRESSION: Atrophy with small vessel chronic ischemic changes of deep cerebral white matter. No acute intracranial abnormalities. Electronically Signed   By: Ulyses SouthwardMark  Boles M.D.   On: 01/01/2016 10:11   Dg Chest Port 1 View  Result Date: 01/01/2016 CLINICAL DATA:  Fall.  Mental status changes. EXAM: PORTABLE CHEST 1 VIEW COMPARISON:  06/25/2008 FINDINGS: Midline trachea. Normal heart size. Atherosclerosis in the transverse aorta. No pleural effusion or pneumothorax. Mild scarring at the left lung base laterally. IMPRESSION: No acute cardiopulmonary disease. Aortic atherosclerosis. Electronically Signed   By: Jeronimo GreavesKyle  Talbot M.D.   On: 01/01/2016 10:34    Assessment/Plan  Acute encephalopathy Likely multifactorial with her physical deconditioning from recent UTI, possible another UTI, dementia all contributing to this. She has soft BP reading. Hypotension and low heart rate with decreased brain perfusion could also be contributing some. Send u/a with c/s. Reviewed labs, normal white blood cell count and no signs of hypovolemia noted. Currently on risperdal.   Dementia with behavior disturbance Son concerned with pt not recognizing him which is a new change for her. Her dementia has likely progressed with  recent infection precipitating some behavior changes. Continue risperdal for now. Continue seroquel if has symptom of sundowning and psychosis changes. Continue aricept  Fall risk Had a fall 3 days back and her deconditioning and memory loss increase risk for fall. Scoop mattress to help prevent fall. Patient under observation of staff in day room to prevent fall. BP med changes as below to reduce fall risk  Hypotension Likely iatrogenic. On coreg 6.25 mg bid with valsartsan and norvasc. With low BP and low HR, decrease coreg to 3.125  mg bid and monitor. Discontinue norvasc. Monitor BP q shift  Bradycardia Likely iatrogenic, decrease coreg as above and monitor  Anemia of chronic disease Monitor cbc, reviewed and noticed drop in Hb. On baby aspirin with hx of CAD. Check cbc in 1 week  ckd stage 3 Monitor BMP with her on valsartan   Goals of care: short term rehabilitation   Labs/tests ordered: cbc, bmp 01/18/16  Family/ staff Communication: reviewed care plan with patient, her son and nursing supervisor    Oneal Grout, MD Internal Medicine [facility] [address] [place], [place] [address] Cell Phone (Monday-Friday 8 am - 5 pm): [phone] On Call: [phone] and follow prompts after 5 pm and on weekends Office Phone: [phone] Office Fax: [phone]

## 2016-01-13 ENCOUNTER — Ambulatory Visit: Payer: Medicare Other | Admitting: Nurse Practitioner

## 2016-01-16 ENCOUNTER — Ambulatory Visit: Payer: Medicare Other | Admitting: Nurse Practitioner

## 2016-01-16 ENCOUNTER — Other Ambulatory Visit: Payer: Self-pay

## 2016-01-16 ENCOUNTER — Non-Acute Institutional Stay (SKILLED_NURSING_FACILITY): Payer: Medicare Other | Admitting: Family

## 2016-01-16 DIAGNOSIS — F0390 Unspecified dementia without behavioral disturbance: Secondary | ICD-10-CM | POA: Diagnosis not present

## 2016-01-16 DIAGNOSIS — Y92129 Unspecified place in nursing home as the place of occurrence of the external cause: Secondary | ICD-10-CM

## 2016-01-16 DIAGNOSIS — W19XXXA Unspecified fall, initial encounter: Secondary | ICD-10-CM | POA: Diagnosis not present

## 2016-01-16 DIAGNOSIS — R269 Unspecified abnormalities of gait and mobility: Secondary | ICD-10-CM | POA: Diagnosis not present

## 2016-01-16 MED ORDER — HYDROCODONE-ACETAMINOPHEN 5-325 MG PO TABS: 1.0000 | ORAL_TABLET | ORAL | 0 refills | Status: DC | PRN

## 2016-01-16 NOTE — Progress Notes (Signed)
Location:  [facility] Room Number: 1007 P Place of Service:  SNF (31) Provider:  Dinah Ngetich FNP-C   Shirline Freesory Nafziger, NP  Patient Care Team: Shirline Freesory Nafziger, NP as PCP - General (Family Medicine)  Extended Emergency Contact Information Primary Emergency Contact: Kozak,Richard Address: [address]          [place], [place] Home Phone: [phone] Relation: Son  Code Status:  DNR  Goals of care: Advanced Directive information Advanced Directives 01/09/2016  Does patient have an advance directive? Yes  Type of Advance Directive Out of facility DNR (pink MOST or yellow form)  Does patient want to make changes to advanced directive? No - Patient declined  Copy of advanced directive(s) in chart? Yes  Would patient like information on creating an advanced directive? -     Chief Complaint  Patient presents with  . Acute Visit    follow fall     HPI:  Pt is a 82 y.o. female seen today at [facility]  for an acute visit for follow up fall. She has a significant medical history of Dementia, HTN, seizure among other conditions. She is seen in her room today per the facility nurse request. Facility Nurse reports patient slid off side of bed and was found crawling on the floor. No injuries sustained.Facility Nurse also states patient's son requested PRN pain medications to be given to patient during the weekend for chronic back pain. Nurse states patient complain of back pain to son but does not tell facility staff.Facility Nurse request pain meds to be scheduled but will avoid scheduling pain meds due to high risk for falls and increased sedation. Discussed pain medication administration with Dr. Glade LloydPandey recommend schedule Tylenol for now and continue with PRN pain medication.     Past Medical History:  Diagnosis Date  . CHF (congestive heart failure) (HCC)    Diastolic CHF. Echo (3/10) EF 45-50% with periapical  akinesis. Mild LVH. Mild MR. Pseudonormal diastolic function. Echo (4/10): EF 65% with mild focal basal septal hypertrophy. Pseudonormal diastolic function. Normal wall motion. Moderate biatrial enlargement. PASP 61 mmHg. Echo (4/11): EF 55%, inferobasal hypokinesis, mild MR, PA systolic pressure 44 mmHg.  [name]. Coronary artery disease    NSTEMI 3/10. LHC showed 80% mLAD, 90% mCFX. She had PCI with Endeavor 2.5 x 12 to LAD and Endeavor 3.0 x 12 to CFX  . Dementia   . Expressive aphasia syndrome    More likely atypical migraine than TIA. Carotid dopplers (8/09) with 40-60% LICA stenosis.  . Frequent falls   . Horner's syndrome    left  . Hypertension   . Hypothyroidism   . Lacunar stroke (HCC)    on CT  . SVT (supraventricular tachycardia) (HCC)    transient   No past surgical history on file.  No Known Allergies    Medication List       Accurate as of 01/16/16  1:27 PM. Always use your most recent med list.          amLODipine 2.5 MG tablet Commonly known as:  NORVASC Take 1 tablet (2.5 mg total) by mouth daily.   aspirin EC 81 MG tablet Take 162 mg by mouth daily.   atorvastatin 20 MG tablet Commonly known as:  LIPITOR Take 1 tablet (20 mg total) by mouth daily.   carvedilol 6.25 MG tablet Commonly known as:  COREG Take 1 tablet (6.25 mg total) by mouth  2 (two) times daily.   divalproex 500 MG 24 hr tablet Commonly known as:  DEPAKOTE ER Take 1 tablet (500 mg total) by mouth at bedtime.   donepezil 10 MG tablet Commonly known as:  ARICEPT Take 1 tablet (10 mg total) by mouth at bedtime.   HYDROcodone-acetaminophen 5-325 MG tablet Commonly known as:  NORCO/VICODIN Take 1-2 tablets by mouth every 4 (four) hours as needed for moderate pain.   levothyroxine 25 MCG tablet Commonly known as:  SYNTHROID, LEVOTHROID Take 1 tablet (25 mcg total) by mouth daily before breakfast.   nitroGLYCERIN 0.4 MG SL tablet Commonly known as:  NITROSTAT Place 0.4 mg under the  tongue every 5 (five) minutes as needed for chest pain (3 DOSES MAX).   risperiDONE 0.5 MG tablet Commonly known as:  RISPERDAL Take 1 tablet (0.5 mg total) by mouth 2 (two) times daily.   sertraline 25 MG tablet Commonly known as:  ZOLOFT Take 25 mg by mouth daily.   valsartan 160 MG tablet Commonly known as:  DIOVAN Take 1 tablet (160 mg total) by mouth 2 (two) times daily.   Vitamin D (Ergocalciferol) 50000 units Caps capsule Commonly known as:  DRISDOL TAKE ONE CAPSULE BY MOUTH WEEKLY       Review of Systems  Constitutional: Negative for activity change, appetite change, chills and fever.  HENT: Negative for congestion, rhinorrhea, sinus pressure, sneezing and sore throat.   Eyes: Negative.   Respiratory: Negative for cough, chest tightness, shortness of breath and wheezing.   Cardiovascular: Negative for chest pain and palpitations.       Chronic leg swelling   Gastrointestinal: Negative for abdominal distention, abdominal pain, constipation, diarrhea and vomiting.  Genitourinary: Negative for difficulty urinating, frequency and urgency.  Musculoskeletal: Positive for back pain and gait problem.  Skin: Negative for color change, pallor and rash.  Neurological: Negative for dizziness, seizures, syncope, light-headedness and headaches.  Psychiatric/Behavioral: Negative for agitation, confusion, hallucinations and sleep disturbance. The patient is not nervous/anxious.     Immunization History  Administered Date(s) Administered  . Influenza Split 11/26/2011  . Influenza Whole 11/27/1998, 12/30/2006, 12/05/2007, 01/17/2009, 01/10/2010  . Influenza, High Dose Seasonal PF 03/22/2015  . Influenza,inj,Quad PF,36+ Mos 01/28/2013, 01/02/2016  . PPD Test 01/05/2016  . Pneumococcal Conjugate-13 03/22/2015  . Pneumococcal Polysaccharide-23 02/26/2005  . Td 02/26/2005   Pertinent  Health Maintenance Due  Topic Date Due  . DEXA SCAN  07/28/1987  . INFLUENZA VACCINE  Completed    . PNA vac Low Risk Adult  Completed   Fall Risk  03/22/2015 02/12/2014 01/28/2013 07/25/2012 03/26/2012  Falls in the past year? No Yes No Yes Yes  Number falls in past yr: - 2 or more - - -  Risk Factor Category  - High Fall Risk - - -  Risk for fall due to : - - Impaired balance/gait Impaired balance/gait;Impaired mobility Impaired balance/gait;Impaired mobility   Functional Status Survey:    Vitals:   01/16/16 1200  BP: 112/66  Pulse: 72  Resp: 18  Temp: 97.6 F (36.4 C)  SpO2: 98%  Weight: 111 lb (50.3 kg)  Height: 4\' 10"  (1.473 m)   Body mass index is 23.2 kg/m. Physical Exam  Constitutional: She appears well-developed and well-nourished. No distress.  HENT:  Head: Normocephalic.  Mouth/Throat: Oropharynx is clear and moist. No oropharyngeal exudate.  Eyes: Conjunctivae and EOM are normal. Pupils are equal, round, and reactive to light. Right eye exhibits no discharge. Left eye exhibits no discharge.  No scleral icterus.  Neck: Normal range of motion. No JVD present. No thyromegaly present.  Cardiovascular: Normal rate, regular rhythm, normal heart sounds and intact distal pulses.  Exam reveals no gallop and no friction rub.   No murmur heard. Pulmonary/Chest: Effort normal and breath sounds normal. No respiratory distress. She has no wheezes. She has no rales.  Abdominal: Soft. Bowel sounds are normal. She exhibits no distension. There is no tenderness. There is no rebound and no guarding.  Musculoskeletal: She exhibits no deformity.  Moves x 4 extremities. Unsteady gait  Lymphadenopathy:    She has no cervical adenopathy.  Neurological: She is alert.  Skin: Skin is warm and dry. No rash noted. No erythema. No pallor.  Psychiatric: She has a normal mood and affect.    Labs reviewed:  Recent Labs  01/03/16 0320 01/04/16 0335 01/05/16 0327 01/12/16  NA 138 136 136 138  K 3.6 3.8 3.6 4.3  CL 104 100* 101  --   CO2 26 28 27   --   GLUCOSE 98 102* 101*  --   BUN  21* 19 17 41*  CREATININE 0.96 1.02* 1.06* 1.3*  CALCIUM 8.5* 8.5* 8.8*  --     Recent Labs  09/12/15 1019 01/01/16 1026  AST 17 58*  ALT 13 36  ALKPHOS 63 56  BILITOT 0.5 0.9  PROT 6.5 6.7  ALBUMIN 3.6 3.2*    Recent Labs  10/04/15 0008 10/12/15 1558 01/01/16 1026  01/03/16 0320 01/04/16 0335 01/05/16 0327 01/12/16  WBC 7.2 7.7 11.5*  < > 7.0 6.8 6.6 7.6  NEUTROABS 4.2 4.2 8.4*  --   --   --   --   --   HGB 9.8* 10.4* 12.2  < > 11.4* 11.1* 11.2* 9.4*  HCT 29.1* 30.1* 36.2  < > 33.1* 33.3* 33.2* 28*  MCV 92.1 91.0 93.3  < > 93.8 94.1 94.1  --   PLT 171 223.0 164  < > 156 167 165 133*  < > = values in this interval not displayed. Lab Results  Component Value Date   TSH 4.261 01/01/2016   Lab Results  Component Value Date   HGBA1C 5.2 01/01/2016   Lab Results  Component Value Date   CHOL 109 (L) 09/12/2015   HDL 83 09/12/2015   LDLCALC 13 09/12/2015   LDLDIRECT 36.0 03/22/2009   TRIG 67 09/12/2015   CHOLHDL 1.3 09/12/2015   Assessment/Plan 1. Abnormality of gait Continue with PT/OT for ROM, exercise, gait stability and muscle strengthening. Fall and safety precautions.   2. Dementia without behavioral disturbance, unspecified dementia type Continue to reorient and assist with ADL's. Continue on divalproex, Aricept and Risperdal.   3. Fall at nursing home, initial encounter Slid off the bed. No injuries sustained. Continue to monitor.     Family/ staff Communication: Reviewed plan of care with patient and facility Nurse supervisor.   Labs/tests ordered:  None

## 2016-01-16 NOTE — Telephone Encounter (Signed)
Prescription request was received from:  [facility] [address] [place] [address]  Phone: [phone]  Fax: [phone]  

## 2016-01-18 ENCOUNTER — Telehealth: Payer: Self-pay | Admitting: Adult Health

## 2016-01-18 NOTE — Telephone Encounter (Signed)
Will route to Cory as FYI. 

## 2016-01-18 NOTE — Telephone Encounter (Signed)
° ° °  FYI ;   Pt son call to say pt had to be sent to a rehab and that she will probably be in there for a while.

## 2016-01-18 NOTE — Telephone Encounter (Signed)
noted 

## 2016-01-25 ENCOUNTER — Encounter: Payer: Self-pay | Admitting: Neurology

## 2016-01-25 ENCOUNTER — Ambulatory Visit (INDEPENDENT_AMBULATORY_CARE_PROVIDER_SITE_OTHER): Payer: Medicare Other | Admitting: Neurology

## 2016-01-25 VITALS — BP 132/61 | HR 59 | Ht <= 58 in | Wt 116.0 lb

## 2016-01-25 DIAGNOSIS — G40109 Localization-related (focal) (partial) symptomatic epilepsy and epileptic syndromes with simple partial seizures, not intractable, without status epilepticus: Secondary | ICD-10-CM

## 2016-01-25 DIAGNOSIS — F0390 Unspecified dementia without behavioral disturbance: Secondary | ICD-10-CM

## 2016-01-25 DIAGNOSIS — R269 Unspecified abnormalities of gait and mobility: Secondary | ICD-10-CM | POA: Diagnosis not present

## 2016-01-25 DIAGNOSIS — I6523 Occlusion and stenosis of bilateral carotid arteries: Secondary | ICD-10-CM | POA: Diagnosis not present

## 2016-01-25 MED ORDER — DIVALPROEX SODIUM 250 MG PO DR TAB: DELAYED_RELEASE_TABLET | ORAL | 11 refills | Status: DC

## 2016-01-25 NOTE — Progress Notes (Signed)
[facility]  PATIENT: [name] DOB: [birth year]   REASON FOR VISIT: Follow-up for memory loss, gait abnormality seizure disorder HISTORY FROM: Patient and son [name]    HISTORY OF PRESENT ILLNESS: [name]  is a 82  -year-old right-handed Caucasian female follow-up for seizure, and cognitive impairment   On June 25, 2008, she presented to the ER with complex partial seizure. At that time she reportedly had a few years history of intermittent episode of difficulty talking that might last about 30 minutes, but never experienced total loss of consciousness. However on April 30, she developed episode of difficulty talking again, followed by head deviation to the right side, whole-body tonic-clonic movements.  The events lasted a few minutes and then she had post-ictal confusion.  She had 3 similar recurrent episodes in 2 hours, followed by transient mild right-sided paralysis.  A MRI of the brain  revealed mild chronic small vessel disease but no acute lesions. She most likely has complex partial seizure stemming from the left frontal region.  She was started on Keppra 250 b.i.d and has no recurrent episodes. Because of her mild right side paralysis she was admitted to a nursing facility for a few months but recovered well and is now back at home. She lives alone with family support.   She is dong well, lives alone, independent in her daily activity, her son check on her regularly.  She also had a gradual onset memory trouble  UPDATE Feb 5th 2014.YY She is doing very well, no recurrent seizures, lives alone at her house, son checks on her regularly, she is taking keppra 250mg  bid. she enjoys reading, relies on a cane.  UPDATE May 22nd 2015:YY She has no recurrent seizure, gradual onset mild memory trouble, getting much worse during the most recent UTI, is on donepezil 5 mg every night, complains of nightmare  UPDATE June 14th 2016:YY She is with her son at  today's clinical visit, she had slow worsening memory trouble, was started on Aricept 5 mg daily by her primary care few months ago, which did help her, she complains of vivid dreams, no recurrent seizures.   UPDATE Feb 07 2015:YY She has her granddaughter CNA to take care of her at home, Her son reported that she gets agitated easily, she still enjoys reading, has daily routine, she eats meal regularly, sleeps well, increased gait difficulty, using a cane, had a risk to fall.  UPDATE October 19 2015:YY Son reported 3 episodes of confusion since June 2017, each episode is similar, last one was October 19 2015, her son heard her hit the floor, lying on her side, confused, staring into space, last a few minutes  She called her son on August 9th 2017 with skull abrasion, personally reviewed CT of head without contrast, Generalized atrophy, periventricular small vessel disease, no acute abnormalities. CT of cervical spine, no acute fracture, multiple level degenerative disc disease  Laboratory evaluation showed hemoglobin 10 point 4, BMP showed creatinine 1.4, hemoglobin was 11 point 6 in July 2017  UPDATE Nov 29th 2017: She was admitted to the hospital early November 2017, was found by her son lying on her right side on the floor, was bumped bladder incontinence or early-morning, she did have urinary tract infection prior to admission was treated with Keflex,   She is now had increased confusion, memory loss, lack of interest, is receiving physical therapy occupational therapy at nursing home, significant decline from his functional status, her son is  considering permanent placement at nursing home.   I reviewed CT head without contrast on January 01 2016, no acute abnormality, generalized atrophy, supratentorium small vessel disease.  Laboratory evaluation showed normal BMP, creatinine 1.06, CBC showed anemia with hemoglobin of 11 point 2,  REVIEW OF SYSTEMS: Full 14 system review of systems  performed and notable only for those listed, all others are neg:  seizure, agitation, confusion, memory loss, bruise easily, incontinence of bowel and bladder, hearing loss, cold intolerance, activity changes   ALLERGIES: No Known Allergies  HOME MEDICATIONS: Outpatient Medications Prior to Visit  Medication Sig Dispense Refill  . amLODipine (NORVASC) 2.5 MG tablet Take 1 tablet (2.5 mg total) by mouth daily. 90 tablet 3  . aspirin EC 81 MG tablet Take 162 mg by mouth daily.    [name]. atorvastatin (LIPITOR) 20 MG tablet Take 1 tablet (20 mg total) by mouth daily. 90 tablet 1  . carvedilol (COREG) 6.25 MG tablet Take 1 tablet (6.25 mg total) by mouth 2 (two) times daily. (Patient taking differently: Take 3.125 mg by mouth 2 (two) times daily. ) 60 tablet 9  . divalproex (DEPAKOTE ER) 500 MG 24 hr tablet Take 1 tablet (500 mg total) by mouth at bedtime. 30 tablet 0  . donepezil (ARICEPT) 10 MG tablet Take 1 tablet (10 mg total) by mouth at bedtime. 30 tablet 11  . HYDROcodone-acetaminophen (NORCO/VICODIN) 5-325 MG tablet Take 1 tablet by mouth every 4 (four) hours as needed for moderate pain. 180 tablet 0  . levothyroxine (SYNTHROID, LEVOTHROID) 25 MCG tablet Take 1 tablet (25 mcg total) by mouth daily before breakfast. 30 tablet 0  . nitroGLYCERIN (NITROSTAT) 0.4 MG SL tablet Place 0.4 mg under the tongue every 5 (five) minutes as needed for chest pain (3 DOSES MAX).    [name]. risperiDONE (RISPERDAL) 0.5 MG tablet Take 1 tablet (0.5 mg total) by mouth 2 (two) times daily. 10 tablet 0  . sertraline (ZOLOFT) 25 MG tablet Take 25 mg by mouth daily.    . valsartan (DIOVAN) 160 MG tablet Take 1 tablet (160 mg total) by mouth 2 (two) times daily. 60 tablet 11  . Vitamin D, Ergocalciferol, (DRISDOL) 50000 units CAPS capsule TAKE ONE CAPSULE BY MOUTH WEEKLY 90 capsule 1   No facility-administered medications prior to visit.     PAST MEDICAL HISTORY: Past Medical History:  Diagnosis Date  . CHF (congestive  heart failure) (HCC)    Diastolic CHF. Echo (3/10) EF 45-50% with periapical akinesis. Mild LVH. Mild MR. Pseudonormal diastolic function. Echo (4/10): EF 65% with mild focal basal septal hypertrophy. Pseudonormal diastolic function. Normal wall motion. Moderate biatrial enlargement. PASP 61 mmHg. Echo (4/11): EF 55%, inferobasal hypokinesis, mild MR, PA systolic pressure 44 mmHg.  [name]. Coronary artery disease    NSTEMI 3/10. LHC showed 80% mLAD, 90% mCFX. She had PCI with Endeavor 2.5 x 12 to LAD and Endeavor 3.0 x 12 to CFX  . Dementia   . Expressive aphasia syndrome    More likely atypical migraine than TIA. Carotid dopplers (8/09) with 40-60% LICA stenosis.  . Frequent falls   . Horner's syndrome    left  . Hypertension   . Hypothyroidism   . Lacunar stroke (HCC)    on CT  . SVT (supraventricular tachycardia) (HCC)    transient    PAST SURGICAL HISTORY: No past surgical history on file.  FAMILY HISTORY: Family History  Problem Relation Age of Onset  . Hypertension Mother   . COPD  Father   . Heart attack Neg Hx     SOCIAL HISTORY: Social History   Social History  . Marital status: Widowed    Spouse name: N/A  . Number of children: N/A  . Years of education: N/A   Occupational History  . Not on file.   Social History Main Topics  . Smoking status: Former Smoker    Types: Cigarettes    Quit date: 05/25/1950  . Smokeless tobacco: Never Used  . Alcohol use No  . Drug use: No  . Sexual activity: Yes   Other Topics Concern  . Not on file   Social History Narrative  . No narrative on file     PHYSICAL EXAM  Vitals:   12/19/15 1305  BP: (!) 105/49 repeat B/P seated 100/50 standing 90/52  Pulse: (!) 55  Weight: 110 lb 6.4 oz (50.1 kg)   Body mass index is 24.24 kg/m.   PHYSICAL EXAMNIATION:  Gen: NAD, conversant, well nourised, obese, well groomed                     Cardiovascular: Regular rate rhythm, no peripheral edema, warm, nontender. Eyes:  Conjunctivae clear without exudates or hemorrhage Neck: Supple, no carotid bruits. Pulmonary: Clear to auscultation bilaterally   NEUROLOGICAL EXAM:  MENTAL STATUS: Speech:    Speech is normal; fluent and spontaneous with normal comprehension.  Cognition: MMSE 16/30, animal naming 4.     Orientation: She is not oriented to time, place, season     Recent and remote memory: she missed 3/3 recalls     Normal Attention span and concentration     Language, naming, repeating,spontaneous speech: she has difficulty repeating     Fund of knowledge   CRANIAL NERVES: CN II: Visual fields are full to confrontation. Fundoscopic exam is normal with sharp discs and no vascular changes. Pupils are round equal and briskly reactive to light. CN III, IV, VI: extraocular movement are normal. No ptosis. CN V: Facial sensation is intact to pinprick in all 3 divisions bilaterally. Corneal responses are intact.  CN VII: Face is symmetric with normal eye closure and smile. CN VIII:Hard of hearing CN IX, X: Palate elevates symmetrically. Phonation is normal. CN XI: Head turning and shoulder shrug are intact CN XII: Tongue is midline with normal movements and no atrophy.  MOTOR: There was no significant limb muscle weakness notice, she has mild to moderate limb and nuchal rigidity.  REFLEXES: Reflexes are 2+ and symmetric at the biceps, triceps, knees, and ankles. Plantar responses are flexor.  SENSORY: Not reliable  COORDINATION: Rapid alternating movements and fine finger movements are intact. There is no dysmetria on finger-to-nose and heel-knee-shin.    GAIT/STANCE: She needs assistance to get up from seated position, small stride, very unsteady   DIAGNOSTIC DATA (LABS, IMAGING, TESTING) - I reviewed patient records, labs, notes, testing and imaging myself where available.  Lab Results  Component Value Date   WBC 7.6 01/12/2016   HGB 9.4 (A) 01/12/2016   HCT 28 (A) 01/12/2016   MCV 94.1  01/05/2016   PLT 133 (A) 01/12/2016      Component Value Date/Time   NA 138 01/12/2016   K 4.3 01/12/2016   CL 101 01/05/2016 0327   CO2 27 01/05/2016 0327   GLUCOSE 101 (H) 01/05/2016 0327   GLUCOSE 108 (H) 03/05/2006 0926   BUN 41 (A) 01/12/2016   CREATININE 1.3 (A) 01/12/2016   CREATININE 1.06 (H) 01/05/2016 0327  CREATININE 1.55 (H) 09/12/2015 1019   CALCIUM 8.8 (L) 01/05/2016 0327   PROT 6.7 01/01/2016 1026   ALBUMIN 3.2 (L) 01/01/2016 1026   AST 58 (H) 01/01/2016 1026   ALT 36 01/01/2016 1026   ALKPHOS 56 01/01/2016 1026   BILITOT 0.9 01/01/2016 1026   GFRNONAA 44 (L) 01/05/2016 0327   GFRAA 51 (L) 01/05/2016 0327   Lab Results  Component Value Date   CHOL 109 (L) 09/12/2015   HDL 83 09/12/2015   LDLCALC 13 09/12/2015   LDLDIRECT 36.0 03/22/2009   TRIG 67 09/12/2015   CHOLHDL 1.3 09/12/2015   Lab Results  Component Value Date   HGBA1C 5.2 01/01/2016   Lab Results  Component Value Date   VITAMINB12 1,382 (H) 07/17/2013   Lab Results  Component Value Date   TSH 4.261 01/01/2016      ASSESSMENT AND PLAN  82 y.o. year old female  Dementia  Acute worsening of functional status, with increased memory loss, confusion   Likely combination of metabolic toxic etiology,    Complex partial seizure  Possible recurrent seizure January 01 2016, increase her Depakote DR to 250mg  in  the morning, 500mg  at night Gait abnormality   Continue PT/OT speech therapy  Levert FeinsteinYijun Grete Bosko, M.D. Ph.D.  [facility] [address] [place], [place] [address] Phone: [phone] Fax:      [phone]

## 2016-01-31 ENCOUNTER — Non-Acute Institutional Stay (SKILLED_NURSING_FACILITY): Payer: Medicare Other | Admitting: Family

## 2016-01-31 DIAGNOSIS — R600 Localized edema: Secondary | ICD-10-CM

## 2016-01-31 DIAGNOSIS — I5032 Chronic diastolic (congestive) heart failure: Secondary | ICD-10-CM

## 2016-01-31 DIAGNOSIS — I10 Essential (primary) hypertension: Secondary | ICD-10-CM

## 2016-01-31 MED ORDER — FUROSEMIDE 20 MG PO TABS: 20.0000 mg | ORAL_TABLET | Freq: Every day | ORAL | 3 refills | Status: DC

## 2016-01-31 NOTE — Progress Notes (Signed)
Location:  [facility] Room Number: 1007 P  Place of Service:  SNF (31) Provider: Dinah Ngetich FNP-C   Shirline Freesory Nafziger, NP  Patient Care Team: Shirline Freesory Nafziger, NP as PCP - General (Family Medicine)  Extended Emergency Contact Information Primary Emergency Contact: Khanna,Richard Address: [address]          [place], [place] Home Phone: [phone] Relation: Son  Code Status:  DNR  Goals of care: Advanced Directive information Advanced Directives 01/09/2016  Does Patient Have a Medical Advance Directive? Yes  Type of Advance Directive Out of facility DNR (pink MOST or yellow form)  Does patient want to make changes to medical advance directive? No - Patient declined  Copy of Healthcare Power of Attorney in Chart? Yes  Would patient like information on creating a medical advance directive? -     Chief Complaint  Patient presents with  . Acute Visit    edema     HPI:  Pt is a 82 y.o. female seen today at [facility] for an acute visit for evaluation of leg edema. She has a significant medical history of  HTN, CHF, CAD, Hypothyroidism, Seizures, Dementia among other conditions. She is seen in her room today per facility DON request for worsening edema.She denies any shortness of breath, cough or wheezing. Facility Nurse reports no new concerns.    Past Medical History:  Diagnosis Date  . CHF (congestive heart failure) (HCC)    Diastolic CHF. Echo (3/10) EF 45-50% with periapical akinesis. Mild LVH. Mild MR. Pseudonormal diastolic function. Echo (4/10): EF 65% with mild focal basal septal hypertrophy. Pseudonormal diastolic function. Normal wall motion. Moderate biatrial enlargement. PASP 61 mmHg. Echo (4/11): EF 55%, inferobasal hypokinesis, mild MR, PA systolic pressure 44 mmHg.  [name]. Coronary artery disease    NSTEMI 3/10. LHC showed 80% mLAD, 90% mCFX. She had PCI with Endeavor 2.5 x 12 to LAD and  Endeavor 3.0 x 12 to CFX  . Dementia   . Expressive aphasia syndrome    More likely atypical migraine than TIA. Carotid dopplers (8/09) with 40-60% LICA stenosis.  . Frequent falls   . Horner's syndrome    left  . Hypertension   . Hypothyroidism   . Lacunar stroke (HCC)    on CT  . SVT (supraventricular tachycardia) (HCC)    transient   No past surgical history on file.  No Known Allergies    Medication List       Accurate as of 01/31/16  4:25 PM. Always use your most recent med list.          aspirin EC 81 MG tablet Take 162 mg by mouth daily.   atorvastatin 20 MG tablet Commonly known as:  LIPITOR Take 1 tablet (20 mg total) by mouth daily.   carvedilol 3.125 MG tablet Commonly known as:  COREG Take 3.125 mg by mouth 2 (two) times daily with a meal.   divalproex 250 MG DR tablet Commonly known as:  DEPAKOTE One tab at morning, 2 tabs at night   donepezil 10 MG tablet Commonly known as:  ARICEPT Take 1 tablet (10 mg total) by mouth at bedtime.   furosemide 20 MG tablet Commonly known as:  LASIX Take 1 tablet (20 mg total) by mouth daily.   levothyroxine 25 MCG tablet Commonly known as:  SYNTHROID, LEVOTHROID Take 1 tablet (25 mcg total) by mouth daily before breakfast.   nitroGLYCERIN 0.4  MG SL tablet Commonly known as:  NITROSTAT Place 0.4 mg under the tongue every 5 (five) minutes as needed for chest pain (3 DOSES MAX).   risperiDONE 0.5 MG tablet Commonly known as:  RISPERDAL Take 1 tablet (0.5 mg total) by mouth 2 (two) times daily.   sertraline 25 MG tablet Commonly known as:  ZOLOFT Take 25 mg by mouth daily.   TYLENOL 500 MG tablet Generic drug:  acetaminophen Take 500 mg by mouth 2 (two) times daily.   valsartan 160 MG tablet Commonly known as:  DIOVAN Take 1 tablet (160 mg total) by mouth 2 (two) times daily.   Vitamin D (Ergocalciferol) 50000 units Caps capsule Commonly known as:  DRISDOL TAKE ONE CAPSULE BY MOUTH WEEKLY        Review of Systems  Constitutional: Negative for activity change, appetite change, chills and fever.  HENT: Negative for congestion, rhinorrhea, sinus pressure, sneezing and sore throat.   Eyes: Negative.   Respiratory: Negative for cough, chest tightness, shortness of breath and wheezing.   Cardiovascular: Negative for chest pain and palpitations.       Chronic leg swelling   Gastrointestinal: Negative for abdominal distention, abdominal pain, constipation, diarrhea and vomiting.  Genitourinary: Negative for difficulty urinating, frequency and urgency.  Musculoskeletal: Positive for back pain and gait problem.  Skin: Negative for color change, pallor and rash.  Neurological: Negative for dizziness, seizures, syncope, light-headedness and headaches.  Psychiatric/Behavioral: Negative for agitation, confusion, hallucinations and sleep disturbance. The patient is not nervous/anxious.     Immunization History  Administered Date(s) Administered  . Influenza Split 11/26/2011  . Influenza Whole 11/27/1998, 12/30/2006, 12/05/2007, 01/17/2009, 01/10/2010  . Influenza, High Dose Seasonal PF 03/22/2015  . Influenza,inj,Quad PF,36+ Mos 01/28/2013, 01/02/2016  . PPD Test 01/05/2016  . Pneumococcal Conjugate-13 03/22/2015  . Pneumococcal Polysaccharide-23 02/26/2005  . Td 02/26/2005   Pertinent  Health Maintenance Due  Topic Date Due  . DEXA SCAN  07/28/1987  . INFLUENZA VACCINE  Completed  . PNA vac Low Risk Adult  Completed   Fall Risk  03/22/2015 02/12/2014 01/28/2013 07/25/2012 03/26/2012  Falls in the past year? No Yes No Yes Yes  Number falls in past yr: - 2 or more - - -  Risk Factor Category  - High Fall Risk - - -  Risk for fall due to : - - Impaired balance/gait Impaired balance/gait;Impaired mobility Impaired balance/gait;Impaired mobility      Vitals:   01/31/16 1130  BP: (!) 120/56  Pulse: 66  Resp: 20  Temp: 97.1 F (36.2 C)  SpO2: 96%  Weight: 117 lb (53.1 kg)   Height: 4\' 10"  (1.473 m)   Body mass index is 24.45 kg/m. Physical Exam  Constitutional: She appears well-developed and well-nourished.  Pleasant elderly in no acute distress.   HENT:  Head: Normocephalic.  Mouth/Throat: Oropharynx is clear and moist. No oropharyngeal exudate.  Eyes: Conjunctivae and EOM are normal. Pupils are equal, round, and reactive to light. Right eye exhibits no discharge. Left eye exhibits no discharge. No scleral icterus.  Neck: Normal range of motion. No JVD present. No thyromegaly present.  Cardiovascular: Normal rate, regular rhythm, normal heart sounds and intact distal pulses.  Exam reveals no gallop and no friction rub.   No murmur heard. Pulmonary/Chest: Effort normal and breath sounds normal. No respiratory distress. She has no wheezes. She has no rales.  Abdominal: Soft. Bowel sounds are normal. She exhibits no distension. There is no tenderness. There is no rebound  and no guarding.  Musculoskeletal: She exhibits no deformity.  Moves x 4 extremities. Unsteady gait 2-3 + edema. Bilateral Ted hose in place.   Lymphadenopathy:    She has no cervical adenopathy.  Neurological: She is alert.  Skin: Skin is warm and dry. No rash noted. No erythema. No pallor.  Psychiatric: She has a normal mood and affect.    Labs reviewed:  Recent Labs  01/03/16 0320 01/04/16 0335 01/05/16 0327 01/12/16  NA 138 136 136 138  K 3.6 3.8 3.6 4.3  CL 104 100* 101  --   CO2 26 28 27   --   GLUCOSE 98 102* 101*  --   BUN 21* 19 17 41*  CREATININE 0.96 1.02* 1.06* 1.3*  CALCIUM 8.5* 8.5* 8.8*  --     Recent Labs  09/12/15 1019 01/01/16 1026  AST 17 58*  ALT 13 36  ALKPHOS 63 56  BILITOT 0.5 0.9  PROT 6.5 6.7  ALBUMIN 3.6 3.2*    Recent Labs  10/04/15 0008 10/12/15 1558 01/01/16 1026  01/03/16 0320 01/04/16 0335 01/05/16 0327 01/12/16  WBC 7.2 7.7 11.5*  < > 7.0 6.8 6.6 7.6  NEUTROABS 4.2 4.2 8.4*  --   --   --   --   --   HGB 9.8* 10.4* 12.2  < >  11.4* 11.1* 11.2* 9.4*  HCT 29.1* 30.1* 36.2  < > 33.1* 33.3* 33.2* 28*  MCV 92.1 91.0 93.3  < > 93.8 94.1 94.1  --   PLT 171 223.0 164  < > 156 167 165 133*  < > = values in this interval not displayed. Lab Results  Component Value Date   TSH 4.261 01/01/2016   Lab Results  Component Value Date   HGBA1C 5.2 01/01/2016   Lab Results  Component Value Date   CHOL 109 (L) 09/12/2015   HDL 83 09/12/2015   LDLCALC 13 09/12/2015   LDLDIRECT 36.0 03/22/2009   TRIG 67 09/12/2015   CHOLHDL 1.3 09/12/2015   Assessment/Plan 1. Localized edema Worsening edema.Start Furosemide 20 mg Tablet daily hold if Sys B/P < 110. Obtain BMP 02/06/2016  2. CHF  Bilateral lower extremities edema 2-3 +.No recent abrupt weight changes. Exam findings negative for shortness of breath, wheezing or rales. Start Furosemide as above. Obtain Daily weight.  3. HTN Stable. Continue to monitor.   Family/ staff Communication: Reviewed plan of care with patient and facility Nurse supervisor.   Labs/tests ordered: BMP 02/06/2016

## 2016-02-03 ENCOUNTER — Ambulatory Visit: Payer: Medicare Other | Admitting: Cardiology

## 2016-02-06 ENCOUNTER — Non-Acute Institutional Stay (SKILLED_NURSING_FACILITY): Payer: Medicare Other | Admitting: Family

## 2016-02-06 DIAGNOSIS — R059 Cough, unspecified: Secondary | ICD-10-CM

## 2016-02-06 DIAGNOSIS — R05 Cough: Secondary | ICD-10-CM

## 2016-02-06 LAB — BASIC METABOLIC PANEL
BUN: 28 mg/dL — AB (ref 4–21)
BUN: 28 mg/dL — AB (ref 4–21)
CREATININE: 1.3 mg/dL — AB (ref 0.5–1.1)
Creatinine: 1.3 mg/dL — AB (ref 0.5–1.1)
GLUCOSE: 122 mg/dL
GLUCOSE: 122 mg/dL
POTASSIUM: 4.1 mmol/L (ref 3.4–5.3)
Potassium: 4.1 mmol/L (ref 3.4–5.3)
Sodium: 133 mmol/L — AB (ref 137–147)
Sodium: 133 mmol/L — AB (ref 137–147)

## 2016-02-06 LAB — CBC AND DIFFERENTIAL
HCT: 27 % — AB (ref 36–46)
HEMATOCRIT: 27 % — AB (ref 36–46)
HEMOGLOBIN: 9.5 g/dL — AB (ref 12.0–16.0)
Hemoglobin: 9.5 g/dL — AB (ref 12.0–16.0)
PLATELETS: 143 10*3/uL — AB (ref 150–399)
Platelets: 143 10*3/uL — AB (ref 150–399)
WBC: 8.8 10*3/mL
WBC: 8.8 10^3/mL

## 2016-02-06 NOTE — Progress Notes (Signed)
Location:  [facility] Room Number: 1007  Place of Service:  SNF [phone](31) Provider: Dinah Ngetich FNP-C   Shirline Freesory Nafziger, NP  Patient Care Team: Shirline Freesory Nafziger, NP as PCP - General (Family Medicine)  Extended Emergency Contact Information Primary Emergency Contact: Robertson,Kathryn Address: [address]          [place], [place] Home Phone: [phone] Relation: Son  Code Status:  DNR  Goals of care: Advanced Directive information Advanced Directives 01/09/2016  Does Patient Have a Medical Advance Directive? Yes  Type of Advance Directive Out of facility DNR (pink MOST or yellow form)  Does patient want to make changes to medical advance directive? No - Patient declined  Copy of Healthcare Power of Attorney in Chart? Yes  Would patient like information on creating a medical advance directive? -     Chief Complaint  Patient presents with  . Acute Visit    cough     HPI:  Pt is a 82 y.o. female seen today at [name]  for an acute visit for evaluation of cough and congestion. She is seen in her room today per facility Nurse request. Facility Nurse reports patient has had cough and congestion over the weekend.No fever or chills reported. Patient is alert but  unable to provider HPI and ROS due to presence of dementia.    Past Medical History:  Diagnosis Date  . CHF (congestive heart failure) (HCC)    Diastolic CHF. Echo (3/10) EF 45-50% with periapical akinesis. Mild LVH. Mild MR. Pseudonormal diastolic function. Echo (4/10): EF 65% with mild focal basal septal hypertrophy. Pseudonormal diastolic function. Normal wall motion. Moderate biatrial enlargement. PASP 61 mmHg. Echo (4/11): EF 55%, inferobasal hypokinesis, mild MR, PA systolic pressure 44 mmHg.  [name]. Coronary artery disease    NSTEMI 3/10. LHC showed 80% mLAD, 90% mCFX. She had PCI with Endeavor 2.5 x 12 to LAD and Endeavor 3.0 x 12 to CFX  .  Dementia   . Expressive aphasia syndrome    More likely atypical migraine than TIA. Carotid dopplers (8/09) with 40-60% LICA stenosis.  . Frequent falls   . Horner's syndrome    left  . Hypertension   . Hypothyroidism   . Lacunar stroke (HCC)    on CT  . SVT (supraventricular tachycardia) (HCC)    transient   No past surgical history on file.  No Known Allergies    Medication List       Accurate as of 02/06/16  1:03 PM. Always use your most recent med list.          aspirin EC 81 MG tablet Take 162 mg by mouth daily.   atorvastatin 20 MG tablet Commonly known as:  LIPITOR Take 1 tablet (20 mg total) by mouth daily.   carvedilol 3.125 MG tablet Commonly known as:  COREG Take 3.125 mg by mouth 2 (two) times daily with a meal.   divalproex 250 MG DR tablet Commonly known as:  DEPAKOTE One tab at morning, 2 tabs at night   donepezil 10 MG tablet Commonly known as:  ARICEPT Take 1 tablet (10 mg total) by mouth at bedtime.   furosemide 20 MG tablet Commonly known as:  LASIX Take 1 tablet (20 mg total) by mouth daily.   levothyroxine 25 MCG tablet Commonly known as:  SYNTHROID, LEVOTHROID Take 1 tablet (25 mcg total) by mouth daily before breakfast.   nitroGLYCERIN 0.4 MG  SL tablet Commonly known as:  NITROSTAT Place 0.4 mg under the tongue every 5 (five) minutes as needed for chest pain (3 DOSES MAX).   risperiDONE 0.5 MG tablet Commonly known as:  RISPERDAL Take 1 tablet (0.5 mg total) by mouth 2 (two) times daily.   sertraline 25 MG tablet Commonly known as:  ZOLOFT Take 25 mg by mouth daily.   TYLENOL 500 MG tablet Generic drug:  acetaminophen Take 500 mg by mouth 2 (two) times daily.   valsartan 160 MG tablet Commonly known as:  DIOVAN Take 1 tablet (160 mg total) by mouth 2 (two) times daily.   Vitamin D (Ergocalciferol) 50000 units Caps capsule Commonly known as:  DRISDOL TAKE ONE CAPSULE BY MOUTH WEEKLY       Review of Systems  Unable  to perform ROS: Dementia    Immunization History  Administered Date(s) Administered  . Influenza Split 11/26/2011  . Influenza Whole 11/27/1998, 12/30/2006, 12/05/2007, 01/17/2009, 01/10/2010  . Influenza, High Dose Seasonal PF 03/22/2015  . Influenza,inj,Quad PF,36+ Mos 01/28/2013, 01/02/2016  . PPD Test 01/05/2016  . Pneumococcal Conjugate-13 03/22/2015  . Pneumococcal Polysaccharide-23 02/26/2005  . Td 02/26/2005   Pertinent  Health Maintenance Due  Topic Date Due  . DEXA SCAN  07/28/1987  . INFLUENZA VACCINE  Completed  . PNA vac Low Risk Adult  Completed   Fall Risk  03/22/2015 02/12/2014 01/28/2013 07/25/2012 03/26/2012  Falls in the past year? No Yes No Yes Yes  Number falls in past yr: - 2 or more - - -  Risk Factor Category  - High Fall Risk - - -  Risk for fall due to : - - Impaired balance/gait Impaired balance/gait;Impaired mobility Impaired balance/gait;Impaired mobility      Vitals:   02/06/16 1130  BP: 136/78  Pulse: 70  Resp: 18  Temp: 97.8 F (36.6 C)  SpO2: 96%  Weight: 117 lb (53.1 kg)  Height: 4\' 10"  (1.473 m)   Body mass index is 24.45 kg/m. Physical Exam  Constitutional: She appears well-developed and well-nourished. No distress.  HENT:  Head: Normocephalic.  Mouth/Throat: Oropharynx is clear and moist. No oropharyngeal exudate.  Eyes: Conjunctivae and EOM are normal. Pupils are equal, round, and reactive to light. Right eye exhibits no discharge. Left eye exhibits no discharge. No scleral icterus.  Neck: Neck supple. No JVD present. No thyromegaly present.  Cardiovascular: Normal rate, regular rhythm, normal heart sounds and intact distal pulses.  Exam reveals no gallop and no friction rub.   No murmur heard. Pulmonary/Chest: Effort normal. No respiratory distress. She has no wheezes.  Bilateral upper lobes rales noted on auscultation.   Abdominal: Soft. Bowel sounds are normal. She exhibits no distension. There is no tenderness. There is no  rebound and no guarding.  Musculoskeletal: She exhibits no deformity.  Moves x 4 extremities. Unsteady gait 2 + edema. Bilateral Ted hose in place.   Lymphadenopathy:    She has no cervical adenopathy.  Neurological: She is alert.  Skin: Skin is warm and dry. No rash noted. No erythema. No pallor.  Psychiatric: She has a normal mood and affect.    Labs reviewed:  Recent Labs  01/03/16 0320 01/04/16 0335 01/05/16 0327 01/12/16  NA 138 136 136 138  K 3.6 3.8 3.6 4.3  CL 104 100* 101  --   CO2 26 28 27   --   GLUCOSE 98 102* 101*  --   BUN 21* 19 17 41*  CREATININE 0.96 1.02* 1.06* 1.3*  CALCIUM 8.5* 8.5* 8.8*  --     Recent Labs  09/12/15 1019 01/01/16 1026  AST 17 58*  ALT 13 36  ALKPHOS 63 56  BILITOT 0.5 0.9  PROT 6.5 6.7  ALBUMIN 3.6 3.2*    Recent Labs  10/04/15 0008 10/12/15 1558 01/01/16 1026  01/03/16 0320 01/04/16 0335 01/05/16 0327 01/12/16  WBC 7.2 7.7 11.5*  < > 7.0 6.8 6.6 7.6  NEUTROABS 4.2 4.2 8.4*  --   --   --   --   --   HGB 9.8* 10.4* 12.2  < > 11.4* 11.1* 11.2* 9.4*  HCT 29.1* 30.1* 36.2  < > 33.1* 33.3* 33.2* 28*  MCV 92.1 91.0 93.3  < > 93.8 94.1 94.1  --   PLT 171 223.0 164  < > 156 167 165 133*  < > = values in this interval not displayed. Lab Results  Component Value Date   TSH 4.261 01/01/2016   Lab Results  Component Value Date   HGBA1C 5.2 01/01/2016   Lab Results  Component Value Date   CHOL 109 (L) 09/12/2015   HDL 83 09/12/2015   LDLCALC 13 09/12/2015   LDLDIRECT 36.0 03/22/2009   TRIG 67 09/12/2015   CHOLHDL 1.3 09/12/2015   Assessment/Plan   Cough Non-productive. Afebrile. Bilateral upper lobes rales noted. Will obtain stat portable CXR rule out pneumonia. Monitor Vital signs every shift X 1 week then resume previous. Continue Robitussin PRN. CBC/diff, BMP stat 02/06/2016.     Family/ staff Communication: Reviewed plan of care with facility Nurse and Nurse supervisor.   Labs/tests ordered:  stat portable CXR  rule out pneumonia.

## 2016-02-09 ENCOUNTER — Emergency Department (HOSPITAL_COMMUNITY): Payer: Medicare Other

## 2016-02-09 ENCOUNTER — Inpatient Hospital Stay (HOSPITAL_COMMUNITY)
Admission: EM | Admit: 2016-02-09 | Discharge: 2016-02-13 | DRG: 177 | Disposition: A | Discharge status: Skilled Nursing Facility | Payer: Medicare Other | Attending: Internal Medicine | Admitting: Internal Medicine

## 2016-02-09 ENCOUNTER — Telehealth: Payer: Self-pay | Admitting: *Deleted

## 2016-02-09 ENCOUNTER — Encounter (HOSPITAL_COMMUNITY): Payer: Self-pay | Admitting: Emergency Medicine

## 2016-02-09 ENCOUNTER — Non-Acute Institutional Stay (SKILLED_NURSING_FACILITY): Payer: Medicare Other | Admitting: Family

## 2016-02-09 ENCOUNTER — Inpatient Hospital Stay (HOSPITAL_COMMUNITY): Payer: Medicare Other

## 2016-02-09 DIAGNOSIS — R488 Other symbolic dysfunctions: Secondary | ICD-10-CM | POA: Diagnosis not present

## 2016-02-09 DIAGNOSIS — E871 Hypo-osmolality and hyponatremia: Secondary | ICD-10-CM | POA: Diagnosis not present

## 2016-02-09 DIAGNOSIS — R2990 Unspecified symptoms and signs involving the nervous system: Secondary | ICD-10-CM | POA: Insufficient documentation

## 2016-02-09 DIAGNOSIS — R509 Fever, unspecified: Secondary | ICD-10-CM

## 2016-02-09 DIAGNOSIS — R29810 Facial weakness: Secondary | ICD-10-CM | POA: Diagnosis present

## 2016-02-09 DIAGNOSIS — E039 Hypothyroidism, unspecified: Secondary | ICD-10-CM | POA: Diagnosis present

## 2016-02-09 DIAGNOSIS — Z9181 History of falling: Secondary | ICD-10-CM | POA: Diagnosis not present

## 2016-02-09 DIAGNOSIS — I2510 Atherosclerotic heart disease of native coronary artery without angina pectoris: Secondary | ICD-10-CM | POA: Diagnosis present

## 2016-02-09 DIAGNOSIS — R41841 Cognitive communication deficit: Secondary | ICD-10-CM | POA: Diagnosis not present

## 2016-02-09 DIAGNOSIS — Z7982 Long term (current) use of aspirin: Secondary | ICD-10-CM | POA: Diagnosis not present

## 2016-02-09 DIAGNOSIS — J690 Pneumonitis due to inhalation of food and vomit: Principal | ICD-10-CM

## 2016-02-09 DIAGNOSIS — I639 Cerebral infarction, unspecified: Secondary | ICD-10-CM

## 2016-02-09 DIAGNOSIS — G309 Alzheimer's disease, unspecified: Secondary | ICD-10-CM

## 2016-02-09 DIAGNOSIS — I130 Hypertensive heart and chronic kidney disease with heart failure and stage 1 through stage 4 chronic kidney disease, or unspecified chronic kidney disease: Secondary | ICD-10-CM | POA: Diagnosis present

## 2016-02-09 DIAGNOSIS — F0280 Dementia in other diseases classified elsewhere without behavioral disturbance: Secondary | ICD-10-CM

## 2016-02-09 DIAGNOSIS — Z87440 Personal history of urinary (tract) infections: Secondary | ICD-10-CM

## 2016-02-09 DIAGNOSIS — I69320 Aphasia following cerebral infarction: Secondary | ICD-10-CM | POA: Diagnosis not present

## 2016-02-09 DIAGNOSIS — Z87891 Personal history of nicotine dependence: Secondary | ICD-10-CM | POA: Diagnosis not present

## 2016-02-09 DIAGNOSIS — I5032 Chronic diastolic (congestive) heart failure: Secondary | ICD-10-CM | POA: Diagnosis not present

## 2016-02-09 DIAGNOSIS — D509 Iron deficiency anemia, unspecified: Secondary | ICD-10-CM

## 2016-02-09 DIAGNOSIS — Z79899 Other long term (current) drug therapy: Secondary | ICD-10-CM | POA: Diagnosis not present

## 2016-02-09 DIAGNOSIS — B9620 Unspecified Escherichia coli [E. coli] as the cause of diseases classified elsewhere: Secondary | ICD-10-CM | POA: Diagnosis present

## 2016-02-09 DIAGNOSIS — I252 Old myocardial infarction: Secondary | ICD-10-CM

## 2016-02-09 DIAGNOSIS — E876 Hypokalemia: Secondary | ICD-10-CM | POA: Diagnosis not present

## 2016-02-09 DIAGNOSIS — F0390 Unspecified dementia without behavioral disturbance: Secondary | ICD-10-CM | POA: Diagnosis present

## 2016-02-09 DIAGNOSIS — M6281 Muscle weakness (generalized): Secondary | ICD-10-CM | POA: Diagnosis not present

## 2016-02-09 DIAGNOSIS — I10 Essential (primary) hypertension: Secondary | ICD-10-CM | POA: Diagnosis not present

## 2016-02-09 DIAGNOSIS — R404 Transient alteration of awareness: Secondary | ICD-10-CM | POA: Diagnosis not present

## 2016-02-09 DIAGNOSIS — I6789 Other cerebrovascular disease: Secondary | ICD-10-CM | POA: Diagnosis not present

## 2016-02-09 DIAGNOSIS — R531 Weakness: Secondary | ICD-10-CM | POA: Diagnosis not present

## 2016-02-09 DIAGNOSIS — Z8249 Family history of ischemic heart disease and other diseases of the circulatory system: Secondary | ICD-10-CM

## 2016-02-09 DIAGNOSIS — R001 Bradycardia, unspecified: Secondary | ICD-10-CM | POA: Diagnosis not present

## 2016-02-09 DIAGNOSIS — G9340 Encephalopathy, unspecified: Secondary | ICD-10-CM | POA: Diagnosis not present

## 2016-02-09 DIAGNOSIS — N3000 Acute cystitis without hematuria: Secondary | ICD-10-CM | POA: Diagnosis not present

## 2016-02-09 DIAGNOSIS — Z66 Do not resuscitate: Secondary | ICD-10-CM | POA: Diagnosis present

## 2016-02-09 DIAGNOSIS — R4182 Altered mental status, unspecified: Secondary | ICD-10-CM | POA: Diagnosis not present

## 2016-02-09 DIAGNOSIS — Z8673 Personal history of transient ischemic attack (TIA), and cerebral infarction without residual deficits: Secondary | ICD-10-CM | POA: Diagnosis not present

## 2016-02-09 DIAGNOSIS — N183 Chronic kidney disease, stage 3 unspecified: Secondary | ICD-10-CM | POA: Insufficient documentation

## 2016-02-09 DIAGNOSIS — N390 Urinary tract infection, site not specified: Secondary | ICD-10-CM | POA: Diagnosis present

## 2016-02-09 DIAGNOSIS — F329 Major depressive disorder, single episode, unspecified: Secondary | ICD-10-CM | POA: Diagnosis present

## 2016-02-09 DIAGNOSIS — R278 Other lack of coordination: Secondary | ICD-10-CM | POA: Diagnosis not present

## 2016-02-09 DIAGNOSIS — R1312 Dysphagia, oropharyngeal phase: Secondary | ICD-10-CM | POA: Diagnosis not present

## 2016-02-09 DIAGNOSIS — R627 Adult failure to thrive: Secondary | ICD-10-CM | POA: Diagnosis present

## 2016-02-09 DIAGNOSIS — J9811 Atelectasis: Secondary | ICD-10-CM | POA: Diagnosis not present

## 2016-02-09 DIAGNOSIS — Z955 Presence of coronary angioplasty implant and graft: Secondary | ICD-10-CM | POA: Diagnosis not present

## 2016-02-09 DIAGNOSIS — R5383 Other fatigue: Secondary | ICD-10-CM

## 2016-02-09 DIAGNOSIS — R6520 Severe sepsis without septic shock: Secondary | ICD-10-CM | POA: Diagnosis not present

## 2016-02-09 LAB — URINALYSIS, ROUTINE W REFLEX MICROSCOPIC
Bilirubin Urine: NEGATIVE
Glucose, UA: NEGATIVE mg/dL
KETONES UR: NEGATIVE mg/dL
NITRITE: POSITIVE — AB
PROTEIN: 30 mg/dL — AB
Specific Gravity, Urine: 1.015 (ref 1.005–1.030)
pH: 6 (ref 5.0–8.0)

## 2016-02-09 LAB — CBC WITH DIFFERENTIAL/PLATELET
BASOS PCT: 0 %
Basophils Absolute: 0 10*3/uL (ref 0.0–0.1)
EOS PCT: 2 %
Eosinophils Absolute: 0.2 10*3/uL (ref 0.0–0.7)
HEMATOCRIT: 30.5 % — AB (ref 36.0–46.0)
Hemoglobin: 10.4 g/dL — ABNORMAL LOW (ref 12.0–15.0)
Lymphocytes Relative: 16 %
Lymphs Abs: 1.2 10*3/uL (ref 0.7–4.0)
MCH: 32.5 pg (ref 26.0–34.0)
MCHC: 34.1 g/dL (ref 30.0–36.0)
MCV: 95.3 fL (ref 78.0–100.0)
MONO ABS: 1.3 10*3/uL — AB (ref 0.1–1.0)
MONOS PCT: 17 %
NEUTROS ABS: 4.8 10*3/uL (ref 1.7–7.7)
Neutrophils Relative %: 65 %
PLATELETS: 142 10*3/uL — AB (ref 150–400)
RBC: 3.2 MIL/uL — ABNORMAL LOW (ref 3.87–5.11)
RDW: 14.8 % (ref 11.5–15.5)
WBC: 7.4 10*3/uL (ref 4.0–10.5)

## 2016-02-09 LAB — BASIC METABOLIC PANEL
Anion gap: 10 (ref 5–15)
BUN: 22 mg/dL — ABNORMAL HIGH (ref 6–20)
BUN: 23 mg/dL — AB (ref 4–21)
CALCIUM: 8.8 mg/dL — AB (ref 8.9–10.3)
CO2: 31 mmol/L (ref 22–32)
CREATININE: 1 mg/dL (ref 0.5–1.1)
CREATININE: 1.2 mg/dL — AB (ref 0.44–1.00)
Chloride: 98 mmol/L — ABNORMAL LOW (ref 101–111)
GFR calc non Af Amer: 38 mL/min — ABNORMAL LOW (ref >60)
GFR, EST AFRICAN AMERICAN: 44 mL/min — AB (ref >60)
GLUCOSE: 75 mg/dL (ref 65–99)
GLUCOSE: 98 mg/dL
POTASSIUM: 3.2 mmol/L — AB (ref 3.4–5.3)
Potassium: 2.9 mmol/L — ABNORMAL LOW (ref 3.5–5.1)
Sodium: 139 mmol/L (ref 135–145)
Sodium: 143 mmol/L (ref 137–147)

## 2016-02-09 LAB — I-STAT VENOUS BLOOD GAS, ED
Acid-Base Excess: 10 mmol/L — ABNORMAL HIGH (ref 0.0–2.0)
Bicarbonate: 34.5 mmol/L — ABNORMAL HIGH (ref 20.0–28.0)
O2 Saturation: 44 %
PH VEN: 7.481 — AB (ref 7.250–7.430)
TCO2: 36 mmol/L (ref 0–100)
pCO2, Ven: 46.2 mmHg (ref 44.0–60.0)
pO2, Ven: 23 mmHg — CL (ref 32.0–45.0)

## 2016-02-09 LAB — CBC AND DIFFERENTIAL
HCT: 30 % — AB (ref 36–46)
Hemoglobin: 10.1 g/dL — AB (ref 12.0–16.0)
Platelets: 127 10*3/uL — AB (ref 150–399)
WBC: 5.8 10*3/mL

## 2016-02-09 LAB — PROCALCITONIN: Procalcitonin: 1 ng/mL

## 2016-02-09 LAB — VALPROIC ACID LEVEL: VALPROIC ACID LVL: 50 ug/mL (ref 50.0–100.0)

## 2016-02-09 LAB — URINALYSIS, MICROSCOPIC (REFLEX)

## 2016-02-09 LAB — I-STAT CG4 LACTIC ACID, ED: Lactic Acid, Venous: 1.63 mmol/L (ref 0.5–1.9)

## 2016-02-09 MED ORDER — ONDANSETRON HCL 4 MG PO TABS: 4.0000 mg | ORAL_TABLET | Freq: Four times a day (QID) | ORAL | Status: DC | PRN

## 2016-02-09 MED ORDER — FUROSEMIDE 40 MG PO TABS: 40.0000 mg | ORAL_TABLET | Freq: Every day | ORAL | Status: DC | PRN

## 2016-02-09 MED ORDER — DEXTROSE-NACL 5-0.45 % IV SOLN: INTRAVENOUS | Status: DC

## 2016-02-09 MED ORDER — ATROPINE ORAL SOLUTION 0.08 MG/ML: 0.4000 mg | ORAL | Status: DC | PRN

## 2016-02-09 MED ORDER — DIVALPROEX SODIUM 250 MG PO DR TAB: 250.0000 mg | DELAYED_RELEASE_TABLET | ORAL | Status: DC

## 2016-02-09 MED ORDER — ONDANSETRON HCL 4 MG/2ML IJ SOLN: 4.0000 mg | Freq: Four times a day (QID) | INTRAMUSCULAR | Status: DC | PRN

## 2016-02-09 MED ORDER — LEVOTHYROXINE SODIUM 25 MCG PO TABS
25.0000 ug | ORAL_TABLET | Freq: Every day | ORAL | Status: DC
  Administered 2016-02-10 – 2016-02-13 (×4): 25 ug via ORAL
  Filled 2016-02-09 (×4): qty 1

## 2016-02-09 MED ORDER — HEPARIN SODIUM (PORCINE) 5000 UNIT/ML IJ SOLN
5000.0000 [IU] | Freq: Three times a day (TID) | INTRAMUSCULAR | Status: DC
  Administered 2016-02-09 – 2016-02-13 (×11): 5000 [IU] via SUBCUTANEOUS
  Filled 2016-02-09 (×11): qty 1

## 2016-02-09 MED ORDER — DEXTROSE 5 % IV SOLN
1.0000 g | Freq: Once | INTRAVENOUS | Status: AC
  Administered 2016-02-09: 1 g via INTRAVENOUS
  Filled 2016-02-09: qty 10

## 2016-02-09 MED ORDER — ASPIRIN EC 81 MG PO TBEC: 162.0000 mg | DELAYED_RELEASE_TABLET | Freq: Every day | ORAL | Status: DC

## 2016-02-09 MED ORDER — IRBESARTAN 150 MG PO TABS
150.0000 mg | ORAL_TABLET | Freq: Every day | ORAL | Status: DC
  Administered 2016-02-09: 150 mg via ORAL
  Filled 2016-02-09 (×2): qty 1

## 2016-02-09 MED ORDER — DIVALPROEX SODIUM 500 MG PO DR TAB
500.0000 mg | DELAYED_RELEASE_TABLET | Freq: Every day | ORAL | Status: DC
  Administered 2016-02-09 – 2016-02-10 (×2): 500 mg via ORAL
  Filled 2016-02-09 (×2): qty 1

## 2016-02-09 MED ORDER — GUAIFENESIN ER 600 MG PO TB12
1200.0000 mg | ORAL_TABLET | Freq: Two times a day (BID) | ORAL | Status: DC
  Filled 2016-02-09 (×2): qty 2

## 2016-02-09 MED ORDER — SODIUM CHLORIDE 0.9 % IV SOLN
3.0000 g | Freq: Two times a day (BID) | INTRAVENOUS | Status: DC
  Administered 2016-02-09 – 2016-02-12 (×6): 3 g via INTRAVENOUS
  Filled 2016-02-09 (×10): qty 3

## 2016-02-09 MED ORDER — DONEPEZIL HCL 5 MG PO TABS
10.0000 mg | ORAL_TABLET | Freq: Every day | ORAL | Status: DC
  Administered 2016-02-09 – 2016-02-12 (×4): 10 mg via ORAL
  Filled 2016-02-09 (×4): qty 2

## 2016-02-09 MED ORDER — CARVEDILOL 3.125 MG PO TABS
3.1250 mg | ORAL_TABLET | Freq: Two times a day (BID) | ORAL | Status: DC
  Administered 2016-02-09 – 2016-02-11 (×4): 3.125 mg via ORAL
  Filled 2016-02-09 (×4): qty 1

## 2016-02-09 MED ORDER — ATROPINE SULFATE 1 % OP SOLN
1.0000 [drp] | OPHTHALMIC | Status: DC | PRN
  Filled 2016-02-09: qty 2

## 2016-02-09 MED ORDER — SODIUM CHLORIDE 0.9% FLUSH
3.0000 mL | Freq: Two times a day (BID) | INTRAVENOUS | Status: DC
  Administered 2016-02-09 – 2016-02-12 (×3): 3 mL via INTRAVENOUS

## 2016-02-09 MED ORDER — DIVALPROEX SODIUM 250 MG PO DR TAB
250.0000 mg | DELAYED_RELEASE_TABLET | Freq: Every day | ORAL | Status: DC
  Filled 2016-02-09 (×2): qty 1

## 2016-02-09 MED ORDER — SODIUM CHLORIDE 0.9 % IV SOLN
30.0000 meq | Freq: Once | INTRAVENOUS | Status: AC
  Administered 2016-02-09: 30 meq via INTRAVENOUS
  Filled 2016-02-09: qty 15

## 2016-02-09 MED ORDER — RISPERIDONE 0.5 MG PO TABS
0.5000 mg | ORAL_TABLET | Freq: Two times a day (BID) | ORAL | Status: DC
  Administered 2016-02-09 – 2016-02-12 (×6): 0.5 mg via ORAL
  Filled 2016-02-09 (×8): qty 1

## 2016-02-09 MED ORDER — ATORVASTATIN CALCIUM 10 MG PO TABS
20.0000 mg | ORAL_TABLET | Freq: Every day | ORAL | Status: DC
  Administered 2016-02-10 – 2016-02-11 (×2): 20 mg via ORAL
  Filled 2016-02-09 (×2): qty 2

## 2016-02-09 MED ORDER — KCL IN DEXTROSE-NACL 40-5-0.9 MEQ/L-%-% IV SOLN
INTRAVENOUS | Status: DC
  Administered 2016-02-09: 22:00:00 via INTRAVENOUS
  Administered 2016-02-10: 1000 mL via INTRAVENOUS
  Administered 2016-02-11 – 2016-02-12 (×3): via INTRAVENOUS
  Filled 2016-02-09 (×7): qty 1000

## 2016-02-09 MED ORDER — ACETAMINOPHEN 500 MG PO TABS
500.0000 mg | ORAL_TABLET | Freq: Two times a day (BID) | ORAL | Status: DC
  Administered 2016-02-09 – 2016-02-13 (×7): 500 mg via ORAL
  Filled 2016-02-09 (×7): qty 1

## 2016-02-09 MED ORDER — SERTRALINE HCL 50 MG PO TABS
25.0000 mg | ORAL_TABLET | Freq: Every day | ORAL | Status: DC
  Administered 2016-02-11 – 2016-02-13 (×3): 25 mg via ORAL
  Filled 2016-02-09 (×3): qty 1

## 2016-02-09 NOTE — ED Notes (Signed)
Pt transported to CT ?

## 2016-02-09 NOTE — Progress Notes (Signed)
Pt admitted to unit at 1738 in stable sondition. Family at bedside. Pt to go to MRI.

## 2016-02-09 NOTE — ED Notes (Signed)
Education provided on secretions and moist breathing to son.

## 2016-02-09 NOTE — ED Provider Notes (Signed)
[facility]-EM[facility]ENCY DEPT Provider Note   CSN: [id number] Arrival date & time: 02/09/16  1133   History   Chief Complaint Chief Complaint  Patient presents with  . Failure To Thrive    HPI [name] is a 82 y.o. female.  Patient presents from [facility] with change in mental status and activity. Patient with h/o CVA, CHF, CAD, dementia and was in [facility] after a hospitalization in Nov for a UTI; patient had been progressing well, walking well with walker and interacting up until Sunday. Per patient's son, he was told that she had a decrease in activity on Sunday morning that worsened today and was associated with a new right sided facial droop this morning. Over the weekend patient had UA which was suggestive of UTI but had not been started on antibiotics yet as cultures were pending; CXR at the time per son did not show a pneumonia.   Discussed goals of care with son; patient is DRN and he affirms that patient and family would not want life-prolonging intervention and medication if patient was not to return to baseline or regain functionality.      Past Medical History:  Diagnosis Date  . CHF (congestive heart failure) (HCC)    Diastolic CHF. Echo (3/10) EF 45-50% with periapical akinesis. Mild LVH. Mild MR. Pseudonormal diastolic function. Echo (4/10): EF 65% with mild focal basal septal hypertrophy. Pseudonormal diastolic function. Normal wall motion. Moderate biatrial enlargement. PASP 61 mmHg. Echo (4/11): EF 55%, inferobasal hypokinesis, mild MR, PA systolic pressure 44 mmHg.  [name]. Coronary artery disease    NSTEMI 3/10. LHC showed 80% mLAD, 90% mCFX. She had PCI with Endeavor 2.5 x 12 to LAD and Endeavor 3.0 x 12 to CFX  . Dementia   . Expressive aphasia syndrome    More likely atypical migraine than TIA. Carotid dopplers (8/09) with 40-60% LICA stenosis.  . Frequent falls   . Horner's syndrome    left  . Hypertension   . Hypothyroidism   . Lacunar stroke (HCC)    on CT   . SVT (supraventricular tachycardia) (HCC)    transient   Patient Active Problem List   Diagnosis Date Noted  . Dementia without behavioral disturbance   . Troponin I above reference range 01/01/2016  . UTI (urinary tract infection) 01/01/2016  . General weakness 01/01/2016  . Abnormality of gait 02/07/2015  . Memory loss 07/17/2013  . Seizure disorder, focal motor (HCC) 03/26/2012  . Carotid stenosis 06/26/2011  . Depression 05/10/2010  . HYPERLIPIDEMIA-MIXED 11/05/2008  . DIASTOLIC HEART FAILURE, CHRONIC 09/17/2008  . UNSPECIFIED VENOUS INSUFFICIENCY 06/09/2008  . CORONARY ATHEROSCLEROSIS NATIVE CORONARY ARTERY 05/12/2008  . Hypothyroidism 12/05/2007  . CONSTIPATION, DRUG INDUCED 09/02/2007  . BREAST CYST 09/02/2007  . UNS ADVRS EFF UNS RX MEDICINAL&BIOLOGICAL SBSTNC 06/04/2007  . Essential hypertension 07/25/2006  . OSTEOPOROSIS 07/25/2006  . CEREBROVASCULAR ACCIDENT, HX OF 07/25/2006   History reviewed. No pertinent surgical history.  OB History    No data available     Home Medications    Prior to Admission medications   Medication Sig Start Date End Date Taking? Authorizing Provider  acetaminophen (TYLENOL) 500 MG tablet Take 500 mg by mouth 2 (two) times daily.     Historical Provider, MD  albuterol (PROVENTIL) (2.5 MG/3ML) 0.083% nebulizer solution Take 2.5 mg by nebulization every 6 (six) hours.    Historical Provider, MD  aspirin EC 81 MG tablet Take 162 mg by mouth daily.    Historical Provider,  MD  atorvastatin (LIPITOR) 20 MG tablet Take 1 tablet (20 mg total) by mouth daily. 09/19/15   Laurey Moralealton S McLean, MD  carvedilol (COREG) 3.125 MG tablet Take 3.125 mg by mouth 2 (two) times daily with a meal.    Historical Provider, MD  divalproex (DEPAKOTE) 250 MG DR tablet One tab at morning, 2 tabs at night 01/25/16   Levert FeinsteinYijun Yan, MD  donepezil (ARICEPT) 10 MG tablet Take 1 tablet (10 mg total) by mouth at bedtime. 07/21/15   Levert FeinsteinYijun Yan, MD  furosemide (LASIX) 20 MG tablet  Take 1 tablet (20 mg total) by mouth daily. Patient taking differently: Take 40 mg by mouth daily.  01/31/16   Dinah C Ngetich, NP  levothyroxine (SYNTHROID, LEVOTHROID) 25 MCG tablet Take 1 tablet (25 mcg total) by mouth daily before breakfast. 01/05/16   Merlene Laughtermair Latif Sheikh, DO  nitroGLYCERIN (NITROSTAT) 0.4 MG SL tablet Place 0.4 mg under the tongue every 5 (five) minutes as needed for chest pain (3 DOSES MAX).    Historical Provider, MD  risperiDONE (RISPERDAL) 0.5 MG tablet Take 1 tablet (0.5 mg total) by mouth 2 (two) times daily. 01/05/16   Omair Latif Sheikh, DO  sertraline (ZOLOFT) 25 MG tablet Take 25 mg by mouth daily.    Historical Provider, MD  valsartan (DIOVAN) 160 MG tablet Take 1 tablet (160 mg total) by mouth 2 (two) times daily. 10/24/15   Rosalio MacadamiaLori C Gerhardt, NP  Vitamin D, Ergocalciferol, (DRISDOL) 50000 units CAPS capsule TAKE ONE CAPSULE BY MOUTH WEEKLY 11/16/15   Shirline Freesory Nafziger, NP    Family History Family History  Problem Relation Age of Onset  . Hypertension Mother   . COPD Father   . Heart attack Neg Hx     Social History Social History  Substance Use Topics  . Smoking status: Former Smoker    Types: Cigarettes    Quit date: 05/25/1950  . Smokeless tobacco: Never Used  . Alcohol use No     Allergies   Patient has no known allergies.   Review of Systems Review of Systems  Unable to perform ROS: Mental status change   Physical Exam Updated Vital Signs BP 153/90   Pulse (!) 58   Temp 98 F (36.7 C) (Oral)   Resp 18   SpO2 96%   Physical Exam  Constitutional: She appears well-developed. No distress.  HENT:  Head: Normocephalic and atraumatic.  Eyes: Right eye exhibits no discharge. Left eye exhibits no discharge. No scleral icterus.  Constricted pupils bilaterally - R pupil appears reactive to light, L non reactive but hard to assess  Cardiovascular: Normal rate, regular rhythm, normal heart sounds and intact distal pulses.  Exam reveals no gallop and no  friction rub.   No murmur heard. Pulmonary/Chest: Effort normal. No accessory muscle usage. No respiratory distress. She has no wheezes. She has no rhonchi. She has no rales.  Unable to fully assess as patient only intermittently follows commands; CTA frontal and lateral lung sounds while supine.   Abdominal: Soft. Normal appearance and bowel sounds are normal. She exhibits no distension and no ascites. There is no tenderness.  Neurological: GCS eye subscore is 3. GCS verbal subscore is 2. GCS motor subscore is 6. She displays no Babinski's sign on the right side. She displays no Babinski's sign on the left side.  Patient responds to commands intermittently and is able to move all four extremities though very weakly. Right sided facial droop. Unable to assess complete neurological exam due  to altered mental status.   Skin: Skin is warm, dry and intact. Capillary refill takes 2 to 3 seconds. Bruising noted. No rash noted. She is not diaphoretic.    ED Treatments / Results  Labs (all labs ordered are listed, but only abnormal results are displayed) Labs Reviewed  CBC WITH DIFFERENTIAL/PLATELET - Abnormal; Notable for the following:       Result Value   RBC 3.20 (*)    Hemoglobin 10.4 (*)    HCT 30.5 (*)    Platelets 142 (*)    Monocytes Absolute 1.3 (*)    All other components within normal limits  BASIC METABOLIC PANEL - Abnormal; Notable for the following:    Potassium 2.9 (*)    Chloride 98 (*)    BUN 22 (*)    Creatinine, Ser 1.20 (*)    Calcium 8.8 (*)    GFR calc non Af Amer 38 (*)    GFR calc Af Amer 44 (*)    All other components within normal limits  URINALYSIS, ROUTINE W REFLEX MICROSCOPIC - Abnormal; Notable for the following:    APPearance CLOUDY (*)    Hgb urine dipstick TRACE (*)    Protein, ur 30 (*)    Nitrite POSITIVE (*)    Leukocytes, UA MODERATE (*)    All other components within normal limits  URINALYSIS, MICROSCOPIC (REFLEX) - Abnormal; Notable for the  following:    Bacteria, UA MANY (*)    Squamous Epithelial / LPF 0-5 (*)    All other components within normal limits  I-STAT VENOUS BLOOD GAS, ED - Abnormal; Notable for the following:    pH, Ven 7.481 (*)    pO2, Ven 23.0 (*)    Bicarbonate 34.5 (*)    Acid-Base Excess 10.0 (*)    All other components within normal limits  URINE CULTURE  I-STAT CG4 LACTIC ACID, ED  I-STAT CG4 LACTIC ACID, ED   EKG  EKG Interpretation None      Radiology Ct Head Wo Contrast  Result Date: 02/09/2016 CLINICAL DATA:  Altered mental status. EXAM: CT HEAD WITHOUT CONTRAST TECHNIQUE: Contiguous axial images were obtained from the base of the skull through the vertex without intravenous contrast. COMPARISON:  CT scan of January 01, 2016. FINDINGS: Brain: Mild chronic ischemic white matter disease is noted. Mild diffuse cortical atrophy is noted. No mass effect or midline shift is noted. Ventricular size is within normal limits. There is no evidence of mass lesion, hemorrhage or acute infarction. Vascular: Atherosclerosis of carotid siphons is noted. Skull: Normal. Negative for fracture or focal lesion. Sinuses/Orbits: Bilateral ethmoid and maxillary sinusitis is noted. Other: None. IMPRESSION: Mild chronic ischemic white matter disease. Mild diffuse cortical atrophy. No acute intracranial abnormality seen. Electronically Signed   By: Lupita Raider, M.D.   On: 02/09/2016 13:59   Dg Chest Port 1 View  Result Date: 02/09/2016 CLINICAL DATA:  Mental status change EXAM: PORTABLE CHEST 1 VIEW COMPARISON:  01/01/2016 FINDINGS: Mild bibasilar atelectasis. Negative for heart failure or edema. No pleural effusion. Right peritracheal density most likely vascular ectasia. Follow-up with two-view chest x-ray. Atherosclerotic aortic arch.  Heart size upper normal. IMPRESSION: Mild bibasilar atelectasis.  Negative for heart failure. Right peritracheal soft tissue density likely vascular ectasia. Followup two-view chest  x-ray suggested. Electronically Signed   By: Marlan Palau M.D.   On: 02/09/2016 13:19    Procedures Procedures (including critical care time)  Medications Ordered in ED Medications  atropine 1 %  ophthalmic solution 1 drop (not administered)  potassium chloride 30 mEq in sodium chloride 0.9 % 265 mL (KCL MULTIRUN) IVPB (not administered)  cefTRIAXone (ROCEPHIN) 1 g in dextrose 5 % 50 mL IVPB (1 g Intravenous New Bag/Given 02/09/16 1536)    Initial Impression / Assessment and Plan / ED Course  I have reviewed the triage vital signs and the nursing notes.  Pertinent labs & imaging results that were available during my care of the patient were reviewed by me and considered in my medical decision making (see chart for details).  Clinical Course    [name] is a 82yo female presenting from [facility] with change in mental status and right facial droop. Patient is unable to provide history due to altered mental status but does respond intermittently and follows some commands. On arrival she is afebrile, normotensive and saturating well on RA. CT head does not show acute abnormality, CXR is questionable for aspiration pneumonia vs soft tissue per radiology read; she does not have leukocytosis and CBC. BMP is remarkable for hypokalemia with stable renal function. Venous gas shows metabolic alkalosis consistent with contraction vs diuretic use. Lactic acid is negative. UA is consistent for UTI.   Patient started on Rocephin and given IV KCl. Patient given PO atropine drops to control secretions.   Discussed with son who is in agreement with admission. He endorses wish to discuss with palliative care providers once patient is inpatient.   Discussed patient with Dr. Konrad Dolores with [facility] who is in agreement with admission. Son had no further questions at this time.   Final Clinical Impressions(s) / ED Diagnoses   Final diagnoses:  Acute cystitis without hematuria  Hypokalemia  Encephalopathy  acute   New Prescriptions New Prescriptions   No medications on file     Nyra Market, MD 02/09/16 1625    Nyra Market, MD 02/09/16 1633    Shaune Pollack, MD 02/10/16 [phone]

## 2016-02-09 NOTE — ED Notes (Signed)
Placed patient on the monitor and into a gown waiting on provider 

## 2016-02-09 NOTE — ED Triage Notes (Signed)
Pt from [facility]. Staff state last night seem more confused between 7-3pm. Xray and UTI workup done but no results. Junky cough. Got up went to breakfast and was in wheelchair but became lethargic. DNR- son OTW. Patient sleeping now but arousable to verbal stimuli at some times. Patient appears comfortable and VSS. No known hospice involvement at this time.

## 2016-02-09 NOTE — Progress Notes (Signed)
Location:  [facility] Room Number: 1007  Place of Service:  SNF [phone]) Provider: Dinah Ngetich FNP-C   Shirline Frees, NP  Patient Care Team: Shirline Frees, NP as PCP - General (Family Medicine)  Extended Emergency Contact Information Primary Emergency Contact: Forde,Richard Address: [address]          [place] Home Phone: [phone] Relation: Son  Code Status:  DNR  Goals of care: Advanced Directive information Advanced Directives 01/09/2016  Does Patient Have a Medical Advance Directive? Yes  Type of Advance Directive Out of facility DNR (pink MOST or yellow form)  Does patient want to make changes to medical advance directive? No - Patient declined  Copy of Healthcare Power of Attorney in Chart? Yes  Would patient like information on creating a medical advance directive? -     Chief Complaint  Patient presents with  . Acute Visit    HPI:  Pt is a 82 y.o. female seen today for an acute visit for evaluation of change in condition. She has a significant medical history of HTN, CHF, Seizure disorder, Dementia among other conditions.She is seen in her room today per facility Nurse request.Facility staff states patient was up this morning talking and exercised with PT/OT. Facility staff noticed patient leaning towards her right side during breakfast and non-verbal. She was assisted back to bed. She continues to be alert but non-verbal with right side of the face drooping.Patient's POA contacted by Nurse supervisor. POA agrees for patient to be send to ER for further evaluation. Of note patient had a recent CXR PA/Lat done showed no acute abnormalities. Furosemide dose increased due to cough and congestion.U/A and C/S results pending. Lab results showed WBC 8.8, Hgb 9.5/HCT 27.4 ( previous Hgb 9.4) Na 133,BUN 28, CR 1.33, Glucose 122 ( 02/06/2016).    Past Medical History:  Diagnosis Date  . CHF (congestive  heart failure) (HCC)    Diastolic CHF. Echo (3/10) EF 45-50% with periapical akinesis. Mild LVH. Mild MR. Pseudonormal diastolic function. Echo (4/10): EF 65% with mild focal basal septal hypertrophy. Pseudonormal diastolic function. Normal wall motion. Moderate biatrial enlargement. PASP 61 mmHg. Echo (4/11): EF 55%, inferobasal hypokinesis, mild MR, PA systolic pressure 44 mmHg.  [name] Coronary artery disease    NSTEMI 3/10. LHC showed 80% mLAD, 90% mCFX. She had PCI with Endeavor 2.5 x 12 to LAD and Endeavor 3.0 x 12 to CFX  . Dementia   . Expressive aphasia syndrome    More likely atypical migraine than TIA. Carotid dopplers (8/09) with 40-60% LICA stenosis.  . Frequent falls   . Horner's syndrome    left  . Hypertension   . Hypothyroidism   . Lacunar stroke (HCC)    on CT  . SVT (supraventricular tachycardia) (HCC)    transient   No past surgical history on file.  No Known Allergies    Medication List       Accurate as of 02/09/16 10:44 AM. Always use your most recent med list.          albuterol (2.5 MG/3ML) 0.083% nebulizer solution Commonly known as:  PROVENTIL Take 2.5 mg by nebulization every 6 (six) hours.   aspirin EC 81 MG tablet Take 162 mg by mouth daily.   atorvastatin 20 MG tablet Commonly known as:  LIPITOR Take 1 tablet (20 mg total) by mouth daily.   carvedilol 3.125 MG tablet Commonly known as:  COREG Take 3.125  mg by mouth 2 (two) times daily with a meal.   divalproex 250 MG DR tablet Commonly known as:  DEPAKOTE One tab at morning, 2 tabs at night   donepezil 10 MG tablet Commonly known as:  ARICEPT Take 1 tablet (10 mg total) by mouth at bedtime.   furosemide 20 MG tablet Commonly known as:  LASIX Take 1 tablet (20 mg total) by mouth daily.   levothyroxine 25 MCG tablet Commonly known as:  SYNTHROID, LEVOTHROID Take 1 tablet (25 mcg total) by mouth daily before breakfast.   nitroGLYCERIN 0.4 MG SL tablet Commonly known as:   NITROSTAT Place 0.4 mg under the tongue every 5 (five) minutes as needed for chest pain (3 DOSES MAX).   risperiDONE 0.5 MG tablet Commonly known as:  RISPERDAL Take 1 tablet (0.5 mg total) by mouth 2 (two) times daily.   sertraline 25 MG tablet Commonly known as:  ZOLOFT Take 25 mg by mouth daily.   TYLENOL 500 MG tablet Generic drug:  acetaminophen Take 500 mg by mouth 2 (two) times daily.   valsartan 160 MG tablet Commonly known as:  DIOVAN Take 1 tablet (160 mg total) by mouth 2 (two) times daily.   Vitamin D (Ergocalciferol) 50000 units Caps capsule Commonly known as:  DRISDOL TAKE ONE CAPSULE BY MOUTH WEEKLY       Review of Systems  Unable to perform ROS: Dementia    Immunization History  Administered Date(s) Administered  . Influenza Split 11/26/2011  . Influenza Whole 11/27/1998, 12/30/2006, 12/05/2007, 01/17/2009, 01/10/2010  . Influenza, High Dose Seasonal PF 03/22/2015  . Influenza,inj,Quad PF,36+ Mos 01/28/2013, 01/02/2016  . PPD Test 01/05/2016  . Pneumococcal Conjugate-13 03/22/2015  . Pneumococcal Polysaccharide-23 02/26/2005  . Td 02/26/2005   Pertinent  Health Maintenance Due  Topic Date Due  . DEXA SCAN  07/28/1987  . INFLUENZA VACCINE  Completed  . PNA vac Low Risk Adult  Completed   Fall Risk  03/22/2015 02/12/2014 01/28/2013 07/25/2012 03/26/2012  Falls in the past year? No Yes No Yes Yes  Number falls in past yr: - 2 or more - - -  Risk Factor Category  - High Fall Risk - - -  Risk for fall due to : - - Impaired balance/gait Impaired balance/gait;Impaired mobility Impaired balance/gait;Impaired mobility      Vitals:   02/09/16 1020  BP: 134/68  Pulse: (!) 59  Resp: 18  Temp: 99.4 F (37.4 C)  SpO2: 95%  Weight: 117 lb (53.1 kg)  Height: 4\' 10"  (1.473 m)   Body mass index is 24.45 kg/m. Physical Exam  Constitutional: She appears well-developed and well-nourished. No distress.  Alert but non-verbal and does not follow commands.  Right side of face droop. Clear drainage noted from left nare.   HENT:  Head: Normocephalic.  Mouth/Throat: Oropharynx is clear and moist. No oropharyngeal exudate.  Eyes: Conjunctivae and EOM are normal. Pupils are equal, round, and reactive to light. Right eye exhibits no discharge. Left eye exhibits no discharge. No scleral icterus.  Neck: Neck supple. No JVD present. No thyromegaly present.  Cardiovascular: Normal rate, regular rhythm, normal heart sounds and intact distal pulses.  Exam reveals no gallop and no friction rub.   No murmur heard. Pulmonary/Chest: Effort normal. No respiratory distress. She has no wheezes.  Bilateral rales noted on auscultation.   Abdominal: Soft. Bowel sounds are normal. She exhibits no distension. There is no tenderness. There is no rebound and no guarding.  Musculoskeletal: She exhibits no  deformity.  Moves x 4 extremities. Unsteady gait. Bilateral Ted hose in place.   Lymphadenopathy:    She has no cervical adenopathy.  Neurological: She is alert.  Opens eyes but non-verbal with right side of face drooping.   Skin: Skin is warm and dry. No rash noted. No erythema. No pallor.  Psychiatric: She has a normal mood and affect.    Labs reviewed:  Recent Labs  01/03/16 0320 01/04/16 0335 01/05/16 0327 01/12/16  NA 138 136 136 138  K 3.6 3.8 3.6 4.3  CL 104 100* 101  --   CO2 26 28 27   --   GLUCOSE 98 102* 101*  --   BUN 21* 19 17 41*  CREATININE 0.96 1.02* 1.06* 1.3*  CALCIUM 8.5* 8.5* 8.8*  --     Recent Labs  09/12/15 1019 01/01/16 1026  AST 17 58*  ALT 13 36  ALKPHOS 63 56  BILITOT 0.5 0.9  PROT 6.5 6.7  ALBUMIN 3.6 3.2*    Recent Labs  10/04/15 0008 10/12/15 1558 01/01/16 1026  01/03/16 0320 01/04/16 0335 01/05/16 0327 01/12/16  WBC 7.2 7.7 11.5*  < > 7.0 6.8 6.6 7.6  NEUTROABS 4.2 4.2 8.4*  --   --   --   --   --   HGB 9.8* 10.4* 12.2  < > 11.4* 11.1* 11.2* 9.4*  HCT 29.1* 30.1* 36.2  < > 33.1* 33.3* 33.2* 28*  MCV 92.1  91.0 93.3  < > 93.8 94.1 94.1  --   PLT 171 223.0 164  < > 156 167 165 133*  < > = values in this interval not displayed. Lab Results  Component Value Date   TSH 4.261 01/01/2016   Lab Results  Component Value Date   HGBA1C 5.2 01/01/2016   Lab Results  Component Value Date   CHOL 109 (L) 09/12/2015   HDL 83 09/12/2015   LDLCALC 13 09/12/2015   LDLDIRECT 36.0 03/22/2009   TRIG 67 09/12/2015   CHOLHDL 1.3 09/12/2015   Assessment/Plan 1. Lethargy Alert but non-verbal with right side of face drooping. Unable to follow commands. Will send to ER for evaluation for possible stroke or seizure. Patient's POA notified by Facility Nurse supervisor and he's in agreement with plan.   2. CHF No recent abrupt weight gain although Rales noted to auscultation. Recent CXR PA/Lat results was negative for any abnormalities. Furosemide dose adjusted clinically.   3. Low grade fever Temp 99.4 this visit. Recent WBC 8.8 ( 02/06/2016).U/A and C/S results pending.   4. Anemia  Recent Hgb 9.5/HCT 27.4 ( 02/06/2016).Stable Previous Hgb 9.4. Continue to monitor.   5. Hyponatremia   Na 133 ( 02/06/2016).Send to ER for evaluation.    Family/ staff Communication: Reviewed plan of care with Facility Nurse supervisor. Patient's POA contacted by facility Nurse supervisor already.   Labs/tests ordered:  Deferred to ER   Spent > 40 minutes counseling and discussing plan of care with Facility Nurse supervisor; reviewing medical record; tests; labs; and developing plan of care.

## 2016-02-09 NOTE — Progress Notes (Signed)
Patient is from [facility]. CSW following for disposition and return to SNF when medically cleared and stable for discharge.     Enos FlingAshley Jylan Loeza, MSW, LCSW [facility]/69M Clinical Social Worker [phone]

## 2016-02-09 NOTE — Telephone Encounter (Signed)
I called [name] earlier at [facility] - states patient is unable to communicate, excessively drooling, right side of mouth is drooping.  Says symptoms started around 9:30am this morning.  They are sending her to the ED.  I also returned the call to [name] (son on HIPAA) who was tearful and [facility].

## 2016-02-09 NOTE — H&P (Signed)
History and Physical    Kathryn KidRuth M Mashaw ZHY:[id number] DOB: 07/13/1920 DOA: 02/09/2016  PCP: Shirline Freesory Nafziger, NP Patient coming from: SNF  Chief Complaint:  Facial droop[ and somnolence.   HPI: [name] is a 82 y.o. female with medical history significant of CHF, CAD/NSTEMI status post stent placement, advanced dementia, frequent falls, hypertension, hypothyroidism, stroke, transient SVT presenting with increasing somnolence and right sided facial droop and extremity weakness. Symptoms started on Monday. Remained constant until the last 24 hours at which point her symptoms got significantly worse. Nursing home staff called son to report that she was leaning to the right to the point of nearly falling out of her chair. Patient without any focal complaints such as fever, abdominal pain, nausea, vomiting, chest pain, palpitations, dizziness, vertigo, falls, dysuria, frequency. UA drawn 1 day ago which was concerning for UTI but no antibiotics were started in favor of pending urine culture. Son concerned that pt is aspirating during meals over last couple of days.   ED Course: IV fluids and ceftriaxone initiated  Review of Systems: As per HPI otherwise 10 point review of systems negative.   Ambulatory Status:minimal ambulation  Past Medical History:  Diagnosis Date  . CHF (congestive heart failure) (HCC)    Diastolic CHF. Echo (3/10) EF 45-50% with periapical akinesis. Mild LVH. Mild MR. Pseudonormal diastolic function. Echo (4/10): EF 65% with mild focal basal septal hypertrophy. Pseudonormal diastolic function. Normal wall motion. Moderate biatrial enlargement. PASP 61 mmHg. Echo (4/11): EF 55%, inferobasal hypokinesis, mild MR, PA systolic pressure 44 mmHg.  [name]. Coronary artery disease    NSTEMI 3/10. LHC showed 80% mLAD, 90% mCFX. She had PCI with Endeavor 2.5 x 12 to LAD and Endeavor 3.0 x 12 to CFX  . Dementia   . Expressive aphasia syndrome    More likely atypical migraine than TIA. Carotid  dopplers (8/09) with 40-60% LICA stenosis.  . Frequent falls   . Horner's syndrome    left  . Hypertension   . Hypothyroidism   . Lacunar stroke (HCC)    on CT  . SVT (supraventricular tachycardia) (HCC)    transient    History reviewed. No pertinent surgical history.  Social History   Social History  . Marital status: Widowed    Spouse name: N/A  . Number of children: N/A  . Years of education: N/A   Occupational History  . Not on file.   Social History Main Topics  . Smoking status: Former Smoker    Types: Cigarettes    Quit date: 05/25/1950  . Smokeless tobacco: Never Used  . Alcohol use No  . Drug use: No  . Sexual activity: Yes   Other Topics Concern  . Not on file   Social History Narrative  . No narrative on file    No Known Allergies  Family History  Problem Relation Age of Onset  . Hypertension Mother   . COPD Father   . Heart attack Neg Hx     Prior to Admission medications   Medication Sig Start Date End Date Taking? Authorizing Provider  acetaminophen (TYLENOL) 500 MG tablet Take 500 mg by mouth 2 (two) times daily.     Historical Provider, MD  albuterol (PROVENTIL) (2.5 MG/3ML) 0.083% nebulizer solution Take 2.5 mg by nebulization every 6 (six) hours.    Historical Provider, MD  aspirin EC 81 MG tablet Take 162 mg by mouth daily.    Historical Provider, MD  atorvastatin (LIPITOR) 20 MG tablet Take  1 tablet (20 mg total) by mouth daily. 09/19/15   Laurey Moralealton S McLean, MD  carvedilol (COREG) 3.125 MG tablet Take 3.125 mg by mouth 2 (two) times daily with a meal.    Historical Provider, MD  divalproex (DEPAKOTE) 250 MG DR tablet One tab at morning, 2 tabs at night 01/25/16   Levert FeinsteinYijun Yan, MD  donepezil (ARICEPT) 10 MG tablet Take 1 tablet (10 mg total) by mouth at bedtime. 07/21/15   Levert FeinsteinYijun Yan, MD  furosemide (LASIX) 20 MG tablet Take 1 tablet (20 mg total) by mouth daily. Patient taking differently: Take 40 mg by mouth daily.  01/31/16   Dinah C Ngetich, NP   levothyroxine (SYNTHROID, LEVOTHROID) 25 MCG tablet Take 1 tablet (25 mcg total) by mouth daily before breakfast. 01/05/16   Merlene Laughtermair Latif Sheikh, DO  nitroGLYCERIN (NITROSTAT) 0.4 MG SL tablet Place 0.4 mg under the tongue every 5 (five) minutes as needed for chest pain (3 DOSES MAX).    Historical Provider, MD  risperiDONE (RISPERDAL) 0.5 MG tablet Take 1 tablet (0.5 mg total) by mouth 2 (two) times daily. 01/05/16   Omair Latif Sheikh, DO  sertraline (ZOLOFT) 25 MG tablet Take 25 mg by mouth daily.    Historical Provider, MD  valsartan (DIOVAN) 160 MG tablet Take 1 tablet (160 mg total) by mouth 2 (two) times daily. 10/24/15   Rosalio MacadamiaLori C Gerhardt, NP  Vitamin D, Ergocalciferol, (DRISDOL) 50000 units CAPS capsule TAKE ONE CAPSULE BY MOUTH WEEKLY 11/16/15   Shirline Freesory Nafziger, NP    Physical Exam: Vitals:   02/09/16 1142 02/09/16 1145 02/09/16 1325 02/09/16 1538  BP: 141/56 153/90  179/66  Pulse: (!) 56 (!) 58 (!) 58 69  Resp: 16 (!) 29 18 16   Temp: 98 F (36.7 C)     TempSrc: Oral     SpO2: 94% 98% 96% 93%     General:  Appears calm and comfortable Eyes:  PERRL, EOMI, normal lids, iris ENT:  grossly normal hearing, lips & tongue, mmm Neck:  no LAD, masses or thyromegaly Cardiovascular:  RRR, no m/r/g. No LE edema.  Respiratory:  Upper airway congestion, nml effort Abdomen:  soft, ntnd, NABS Skin:  no rash or induration seen on limited exam Musculoskeletal: 3/5 LUE and LLE weakness and 2/5 extremity weakness. No bony abnormality.  Psychiatric: Sleepy but wakes easily when prompted. Follows simple commands. Pleasant.  Neurologic:  CN 2-12 grossly intact,  extremity weakness noted above. moves all extremities in coordinated fashion, sensation intact  Labs on Admission: I have personally reviewed following labs and imaging studies  CBC:  Recent Labs Lab 02/06/16 02/09/16 1311  WBC 8.8 7.4  NEUTROABS  --  4.8  HGB 9.5* 10.4*  HCT 27* 30.5*  MCV  --  95.3  PLT 143* 142*   Basic  Metabolic Panel:  Recent Labs Lab 02/06/16 02/09/16 1311  NA 133* 139  K 4.1 2.9*  CL  --  98*  CO2  --  31  GLUCOSE  --  75  BUN 28* 22*  CREATININE 1.3* 1.20*  CALCIUM  --  8.8*   GFR: Estimated Creatinine Clearance: 21.2 mL/min (by C-G formula based on SCr of 1.2 mg/dL (H)). Liver Function Tests: No results for input(s): AST, ALT, ALKPHOS, BILITOT, PROT, ALBUMIN in the last 168 hours. No results for input(s): LIPASE, AMYLASE in the last 168 hours. No results for input(s): AMMONIA in the last 168 hours. Coagulation Profile: No results for input(s): INR, PROTIME in the last 168  hours. Cardiac Enzymes: No results for input(s): CKTOTAL, CKMB, CKMBINDEX, TROPONINI in the last 168 hours. BNP (last 3 results) No results for input(s): PROBNP in the last 8760 hours. HbA1C: No results for input(s): HGBA1C in the last 72 hours. CBG: No results for input(s): GLUCAP in the last 168 hours. Lipid Profile: No results for input(s): CHOL, HDL, LDLCALC, TRIG, CHOLHDL, LDLDIRECT in the last 72 hours. Thyroid Function Tests: No results for input(s): TSH, T4TOTAL, FREET4, T3FREE, THYROIDAB in the last 72 hours. Anemia Panel: No results for input(s): VITAMINB12, FOLATE, FERRITIN, TIBC, IRON, RETICCTPCT in the last 72 hours. Urine analysis:    Component Value Date/Time   COLORURINE YELLOW 02/09/2016 1420   APPEARANCEUR CLOUDY (A) 02/09/2016 1420   LABSPEC 1.015 02/09/2016 1420   PHURINE 6.0 02/09/2016 1420   GLUCOSEU NEGATIVE 02/09/2016 1420   HGBUR TRACE (A) 02/09/2016 1420   BILIRUBINUR NEGATIVE 02/09/2016 1420   BILIRUBINUR Neg 12/29/2015 1523   KETONESUR NEGATIVE 02/09/2016 1420   PROTEINUR 30 (A) 02/09/2016 1420   UROBILINOGEN negative 12/29/2015 1523   UROBILINOGEN 0.2 04/27/2008 1300   NITRITE POSITIVE (A) 02/09/2016 1420   LEUKOCYTESUR MODERATE (A) 02/09/2016 1420    Creatinine Clearance: Estimated Creatinine Clearance: 21.2 mL/min (by C-G formula based on SCr of 1.2 mg/dL  (H)).  Sepsis Labs: @LABRCNTIP (procalcitonin:4,lacticidven:4) )No results found for this or any previous visit (from the past 240 hour(s)).   Radiological Exams on Admission: Ct Head Wo Contrast  Result Date: 02/09/2016 CLINICAL DATA:  Altered mental status. EXAM: CT HEAD WITHOUT CONTRAST TECHNIQUE: Contiguous axial images were obtained from the base of the skull through the vertex without intravenous contrast. COMPARISON:  CT scan of January 01, 2016. FINDINGS: Brain: Mild chronic ischemic white matter disease is noted. Mild diffuse cortical atrophy is noted. No mass effect or midline shift is noted. Ventricular size is within normal limits. There is no evidence of mass lesion, hemorrhage or acute infarction. Vascular: Atherosclerosis of carotid siphons is noted. Skull: Normal. Negative for fracture or focal lesion. Sinuses/Orbits: Bilateral ethmoid and maxillary sinusitis is noted. Other: None. IMPRESSION: Mild chronic ischemic white matter disease. Mild diffuse cortical atrophy. No acute intracranial abnormality seen. Electronically Signed   By: Lupita Raider, M.D.   On: 02/09/2016 13:59   Dg Chest Port 1 View  Result Date: 02/09/2016 CLINICAL DATA:  Mental status change EXAM: PORTABLE CHEST 1 VIEW COMPARISON:  01/01/2016 FINDINGS: Mild bibasilar atelectasis. Negative for heart failure or edema. No pleural effusion. Right peritracheal density most likely vascular ectasia. Follow-up with two-view chest x-ray. Atherosclerotic aortic arch.  Heart size upper normal. IMPRESSION: Mild bibasilar atelectasis.  Negative for heart failure. Right peritracheal soft tissue density likely vascular ectasia. Followup two-view chest x-ray suggested. Electronically Signed   By: Marlan Palau M.D.   On: 02/09/2016 13:19    EKG: Independently reviewed. sinus, PAC , no ACS  Assessment/Plan Active Problems:   * No active hospital problems. *    Strokelike symptoms: Stroke versus accommodation of advanced  dementia and UTI. CT head negative. Patient is not a candidate for aggressive intervention. Discussion had with son regarding patient's care as outlined below. -  MRI. The result of this will likely a the palliative care team and discussions with patient's family regarding goals of care and possible hospice.  - PT/OT  AMS: Decreased responsiveness over the last several days. UTI likely causative agent with poor mental reserve due to dementia  - Unasyn - Urine culture  - MRI as above  Cough: Concern for aspiration given multiple episodes of difficulty with swallowing since onset of facial droop. Chest x-ray equivocal. Afebrile. WBC normal. Lungs clear to auscultation. - SLP, nothing by mouth - Head of bed 45 - Mucinex - Unasyn  Hypokalemia: 2.9. Suspect, [place] of renal insufficiency and poor dietary intake - KCl - Magnesium  Hypothyroid: - continue synthroid  Dementia/Depression: - continue depakote, risperdol, aricept  H/o MI/stent: - continue ASA, lipitor  Chronic diastolic congestive heart failure: No evidence of decompensation - Continue Lasix PRN - continue bblocker, ARB  HTN: - continuie coreg, valsartan  Social: Patient's son, [name] [phone]) very reasonable with regards to goals of care and possibility of hospice. _  - Plate of care consult     DVT prophylaxis: Hep  Code Status: DNR  Family Communication: son  Disposition Plan: pending workup and palliative meetings  Consults called: palliative  Admission status: inpt    MERRELL, DAVID J MD [facility]  If 7PM-7AM, please contact night-coverage [email or web address] Password [email or web address]  02/09/2016, 3:51 PM

## 2016-02-09 NOTE — Progress Notes (Signed)
Pharmacy Antibiotic Note  [name] is a 82 y.o. female admitted on 02/09/2016 with aspiration PNA, UTI.  Pharmacy has been consulted for Unasyn dosing. Afebrile, WBC wnl, SCr 1.2 on admit, CrCl~21.  Ceftriaxone 1g given x1 dose at 1536 in the ED.  Plan: Unasyn 3g IV q12h Monitor clinical progress, c/s, renal function, abx plan/LOT     Temp (24hrs), Avg:98.7 F (37.1 C), Min:98 F (36.7 C), Max:99.4 F (37.4 C)   Recent Labs Lab 02/06/16 02/09/16 1311 02/09/16 1351  WBC 8.8 7.4  --   CREATININE 1.3* 1.20*  --   LATICACIDVEN  --   --  1.63    Estimated Creatinine Clearance: 21.2 mL/min (by C-G formula based on SCr of 1.2 mg/dL (H)).    No Known Allergies   [name], PharmD, BCPS Clinical Pharmacist 02/09/2016 4:50 PM

## 2016-02-09 NOTE — Telephone Encounter (Signed)
Son [name] called stating his [facility]. He stated the staff thinks she's "had another TIA and possible UTI". He stated the urine test is still pending. He stated patient is slanting to the right, drooling out of the right side of her mouth, not talking. He stated the staff is treating her as if she's had a stroke, and she is receiving therapy as if she had a stroke.  He is requesting Dr Zannie CoveYan's RN call him to discuss.

## 2016-02-10 ENCOUNTER — Telehealth: Payer: Self-pay | Admitting: Cardiology

## 2016-02-10 DIAGNOSIS — G9340 Encephalopathy, unspecified: Secondary | ICD-10-CM

## 2016-02-10 DIAGNOSIS — N3000 Acute cystitis without hematuria: Secondary | ICD-10-CM

## 2016-02-10 DIAGNOSIS — E876 Hypokalemia: Secondary | ICD-10-CM

## 2016-02-10 DIAGNOSIS — I5032 Chronic diastolic (congestive) heart failure: Secondary | ICD-10-CM

## 2016-02-10 LAB — CBC
HEMATOCRIT: 31.3 % — AB (ref 36.0–46.0)
HEMOGLOBIN: 10.5 g/dL — AB (ref 12.0–15.0)
MCH: 31.9 pg (ref 26.0–34.0)
MCHC: 33.5 g/dL (ref 30.0–36.0)
MCV: 95.1 fL (ref 78.0–100.0)
Platelets: 142 10*3/uL — ABNORMAL LOW (ref 150–400)
RBC: 3.29 MIL/uL — AB (ref 3.87–5.11)
RDW: 14.9 % (ref 11.5–15.5)
WBC: 7.2 10*3/uL (ref 4.0–10.5)

## 2016-02-10 LAB — COMPREHENSIVE METABOLIC PANEL
ALBUMIN: 2.3 g/dL — AB (ref 3.5–5.0)
ALT: 16 U/L (ref 14–54)
ANION GAP: 8 (ref 5–15)
AST: 22 U/L (ref 15–41)
Alkaline Phosphatase: 52 U/L (ref 38–126)
BILIRUBIN TOTAL: 0.2 mg/dL — AB (ref 0.3–1.2)
BUN: 17 mg/dL (ref 6–20)
CO2: 33 mmol/L — ABNORMAL HIGH (ref 22–32)
Calcium: 8.7 mg/dL — ABNORMAL LOW (ref 8.9–10.3)
Chloride: 98 mmol/L — ABNORMAL LOW (ref 101–111)
Creatinine, Ser: 1.2 mg/dL — ABNORMAL HIGH (ref 0.44–1.00)
GFR calc non Af Amer: 38 mL/min — ABNORMAL LOW (ref >60)
GFR, EST AFRICAN AMERICAN: 44 mL/min — AB (ref >60)
GLUCOSE: 105 mg/dL — AB (ref 65–99)
POTASSIUM: 3.3 mmol/L — AB (ref 3.5–5.1)
SODIUM: 139 mmol/L (ref 135–145)
TOTAL PROTEIN: 5.5 g/dL — AB (ref 6.5–8.1)

## 2016-02-10 LAB — LIPID PANEL
CHOL/HDL RATIO: 2.1 ratio
CHOLESTEROL: 110 mg/dL (ref 0–200)
HDL: 53 mg/dL (ref >40)
LDL Cholesterol: 43 mg/dL (ref 0–99)
Triglycerides: 70 mg/dL (ref <150)
VLDL: 14 mg/dL (ref 0–40)

## 2016-02-10 LAB — MRSA PCR SCREENING: MRSA by PCR: NEGATIVE

## 2016-02-10 MED ORDER — HYDRALAZINE HCL 20 MG/ML IJ SOLN
5.0000 mg | Freq: Four times a day (QID) | INTRAMUSCULAR | Status: DC | PRN
  Administered 2016-02-11 – 2016-02-13 (×3): 5 mg via INTRAVENOUS
  Filled 2016-02-10 (×3): qty 1

## 2016-02-10 NOTE — Evaluation (Signed)
Clinical/Bedside Swallow Evaluation Patient Details  Name: [name] MRN: [id number] Date of Birth: 03/22/1920  Today's Date: 02/10/2016 Time: SLP Start Time (ACUTE ONLY): 1150 SLP Stop Time (ACUTE ONLY): 1205 SLP Time Calculation (min) (ACUTE ONLY): 15 min  Past Medical History:  Past Medical History:  Diagnosis Date  . CHF (congestive heart failure) (HCC)    Diastolic CHF. Echo (3/10) EF 45-50% with periapical akinesis. Mild LVH. Mild MR. Pseudonormal diastolic function. Echo (4/10): EF 65% with mild focal basal septal hypertrophy. Pseudonormal diastolic function. Normal wall motion. Moderate biatrial enlargement. PASP 61 mmHg. Echo (4/11): EF 55%, inferobasal hypokinesis, mild MR, PA systolic pressure 44 mmHg.  [name]. Coronary artery disease    NSTEMI 3/10. LHC showed 80% mLAD, 90% mCFX. She had PCI with Endeavor 2.5 x 12 to LAD and Endeavor 3.0 x 12 to CFX  . Dementia   . Expressive aphasia syndrome    More likely atypical migraine than TIA. Carotid dopplers (8/09) with 40-60% LICA stenosis.  . Frequent falls   . Horner's syndrome    left  . Hypertension   . Hypothyroidism   . Lacunar stroke (HCC)    on CT  . SVT (supraventricular tachycardia) (HCC)    transient   Past Surgical History: History reviewed. No pertinent surgical history. HPI:  [name] a 82 y.o.femalewith medical history significant of CHF, CAD/NSTEMIstatus post stent placement, advanced dementia, frequent falls, hypertension, hypothyroidism, stroke, transient SVT presenting with increasing somnolence and right sided facial droop and extremity weakness. Son concerned that pt is aspirating during meals over last couple of days. Palliative consult 02/10/16; family plans to transition to residential hospice ifpatient is not able to be awake/alert, is not eating within the next day or two.   Assessment / Plan / Recommendation Clinical Impression  PO trials inappropriate at this time due to insufficient  alertness. Patient roused briefly with upright positioned and followed basic commands, responded to questions with weak vocalizations. In response to labial thermal stimulation, patient responded with minimal mastication. Presented ice chip via spoon; patient with lethargy and did not attempt to retrieve bolus. Did not continue assessment due to level of alertness. Continue NPO status at this time; recommend oral care QID. SLP to follow up for PO readiness. Son states his mother is usually alert later in the afternoon (after 4pm). Discussed plans for follow up and provided education regarding aspiration risk, and treatment options including alternative nutrition, comfort feeds.     Aspiration Risk  Severe aspiration risk;Risk for inadequate nutrition/hydration    Diet Recommendation NPO   Medication Administration: Via alternative means    Other  Recommendations Oral Care Recommendations: Oral care QID   Follow up Recommendations        Frequency and Duration            Prognosis Prognosis for Safe Diet Advancement: Guarded Barriers to Reach Goals: Cognitive deficits;Severity of deficits      Swallow Study   General Date of Onset: 02/09/16 HPI: [name] a 82 y.o.femalewith medical history significant of CHF, CAD/NSTEMIstatus post stent placement, advanced dementia, frequent falls, hypertension, hypothyroidism, stroke, transient SVT presenting with increasing somnolence and right sided facial droop and extremity weakness. Son concerned that pt is aspirating during meals over last couple of days. Palliative consult 02/10/16; family plans to transition to residential hospice ifpatient is not able to be awake/alert, is not eating within the next day or two. Type of Study: Bedside Swallow Evaluation Diet Prior to this  Study: NPO Temperature Spikes Noted: No Respiratory Status: Room air History of Recent Intubation: No Behavior/Cognition: Lethargic/Drowsy (briefly alert) Oral  Cavity Assessment: Dry Oral Care Completed by SLP: No Self-Feeding Abilities: Other (Comment) (reduced alertness; ceased assessment) Patient Positioning: Upright in bed Baseline Vocal Quality: Low vocal intensity Volitional Cough: Cognitively unable to elicit Volitional Swallow: Unable to elicit    Oral/Motor/Sensory Function Overall Oral Motor/Sensory Function: Generalized oral weakness (Limited assessment due to cognitive status) Facial ROM: Other (Comment) (reduced bilaterally) Lingual ROM: Other (Comment) (reduced bilaterally; minimal movement)   Ice Chips Ice chips: Impaired Presentation: Spoon Oral Phase Impairments: Poor awareness of bolus (did not retrieve bolus) Pharyngeal Phase Impairments: Other (comments) (not tested)   Thin Liquid Thin Liquid: Not tested    Nectar Thick     Honey Thick     Puree Puree: Not tested   Solid   GO   Solid: Not tested       Kathryn BatonMary Beth Cage Gupton, MS CF-SLP Speech-Language Pathologist   [name] 02/10/2016,12:30 PM

## 2016-02-10 NOTE — Evaluation (Signed)
Physical Therapy Evaluation Patient Details Name: Kathryn KidRuth M Dolce MRN: [id number] DOB: 09/14/1920 Today's Date: 02/10/2016   History of Present Illness  [name] is a 82 y.o. female with medical history significant of CHF, CAD/NSTEMI status post stent placement, advanced dementia, frequent falls, hypertension, hypothyroidism, stroke, transient SVT presenting with increasing somnolence and right sided facial droop and extremity weakness.  Clinical Impression  Plan is for pt to either [facility] (with shorter life expectancy) or return to SNF with hospice (with longer life expectancy), depending on pt's response to medications.  Will continue to assess while in acute care for continued appropriateness of PT.  Pt with congested cough and wanted to sit up in recliner today.  Pt fatigued very quickly and needed MAX of 2 to get to recliner.  Will check on pt next week if still in hospital.     Follow Up Recommendations Other (comment) (hospice)    Equipment Recommendations  None recommended by PT    Recommendations for Other Services       Precautions / Restrictions Precautions Precautions: Fall      Mobility  Bed Mobility Overal bed mobility: Needs Assistance Bed Mobility: Supine to Sit     Supine to sit: Max assist     General bed mobility comments: MAX A with use of bed pad to get hips over and to get trunk upright. Pt with congested cough.  Nods yes when asked if she wants to sit up in chair.  Transfers Overall transfer level: Needs assistance Equipment used: 2 person hand held assist Transfers: Stand Pivot Transfers   Stand pivot transfers: Max assist;+2 physical assistance       General transfer comment: Pt with short steps with SPT requiring MAX of 2 due to fatigue and weakness  Ambulation/Gait                Stairs            Wheelchair Mobility    Modified Rankin (Stroke Patients Only) Modified Rankin (Stroke Patients  Only) Pre-Morbid Rankin Score: Moderately severe disability Modified Rankin: Severe disability     Balance Overall balance assessment: Needs assistance   Sitting balance-Leahy Scale: Poor Sitting balance - Comments: Pt sat at EOB with min/guard and progressed to needing more A as she fatigued and became more flexed and bent over.     Standing balance-Leahy Scale: Zero                               Pertinent Vitals/Pain Pain Assessment: Faces Faces Pain Scale: Hurts a little bit Pain Location: unable to clarify, grimacing with re-positioning Pain Descriptors / Indicators: Grimacing Pain Intervention(s): Repositioned    Home Living Family/patient expects to be discharged to:: Hospice/Palliative care                 Additional Comments: Pt has been at SNF last month ambulating with RW 150' with MIN to MIN/guard A level.      Prior Function Level of Independence: Needs assistance   Gait / Transfers Assistance Needed: Amb with SNF PT 150' min to min/guard           Hand Dominance        Extremity/Trunk Assessment   Upper Extremity Assessment Upper Extremity Assessment: Defer to OT evaluation    Lower Extremity Assessment Lower Extremity Assessment: Generalized weakness    Cervical / Trunk Assessment Cervical /  Trunk Assessment: Kyphotic  Communication   Communication: HOH;Expressive difficulties  Cognition Arousal/Alertness: Awake/alert Behavior During Therapy: Flat affect Overall Cognitive Status: No family/caregiver present to determine baseline cognitive functioning                 General Comments: Pt able to follow simple commands    General Comments General comments (skin integrity, edema, etc.): congested cough    Exercises     Assessment/Plan    PT Assessment Patient needs continued PT services  PT Problem List Decreased strength;Decreased activity tolerance;Decreased balance;Decreased mobility          PT  Treatment Interventions Gait training;Functional mobility training;Therapeutic exercise;Therapeutic activities;DME instruction;Balance training;Patient/family education    PT Goals (Current goals can be found in the Care Plan section)  Acute Rehab PT Goals Patient Stated Goal: pt wanting to get up to chair PT Goal Formulation: Patient unable to participate in goal setting Time For Goal Achievement: 02/17/16 Potential to Achieve Goals: Fair    Frequency Min 2X/week   Barriers to discharge        Co-evaluation PT/OT/SLP Co-Evaluation/Treatment: Yes Reason for Co-Treatment: For patient/therapist safety PT goals addressed during session: Mobility/safety with mobility         End of Session Equipment Utilized During Treatment: Gait belt Activity Tolerance: Patient limited by fatigue Patient left: in chair;with call bell/phone within reach;with chair alarm set Nurse Communication: Mobility status         Time: [id number] PT Time Calculation (min) (ACUTE ONLY): 24 min   Charges:   PT Evaluation $PT Eval Low Complexity: 1 Procedure     PT G Codes:        Janyce Ellinger LUBECK 02/10/2016, 2:08 PM

## 2016-02-10 NOTE — Evaluation (Signed)
Occupational Therapy Evaluation Patient Details Name: [name] MRN: [id number] DOB: 12/05/1920 Today's Date: 02/10/2016    History of Present Illness Kathryn KidRuth M Robertson is a 82 y.o. female with medical history significant of CHF, CAD/NSTEMI status post stent placement, advanced dementia, frequent falls, hypertension, hypothyroidism, stroke, transient SVT presenting with increasing somnolence and right sided facial droop and extremity weakness.   Clinical Impression   Pt admitted with the above diagnoses and presents with below problem list. Pt will benefit from continued acute OT to address the below listed deficits and maximize independence with basic ADLs prior to d/c to TBD venue. PTA pt was  min A UB ADLs, mod-max A with LB ADLs, min guard toilet transfer with rw, max A for pericare, and completed household functional mobility with rw and min guard to min A. Pt currently needing +2 max A for SPT, +1 max A for bed mobility. Sat EOB to complete simple grooming tasks, fatigued quickly. D/c plan TBD depending on response to medications. Congested cough noted, nursing notified. Will follow acutely and update recommendations as appropriate.         Follow Up Recommendations  SNF;Supervision/Assistance - 24 hour;Other (comment) (vs. hospice?)    Equipment Recommendations  Other (comment) (TBD next venue)    Recommendations for Other Services       Precautions / Restrictions Precautions Precautions: Fall Restrictions Weight Bearing Restrictions: No      Mobility Bed Mobility Overal bed mobility: Needs Assistance Bed Mobility: Supine to Sit     Supine to sit: Max assist     General bed mobility comments: MAX A with use of bed pad to get hips over and to get trunk upright. Pt with congested cough.  Nods yes when asked if she wants to sit up in chair.  Transfers Overall transfer level: Needs assistance Equipment used: 2 person hand held assist Transfers: Stand Pivot Transfers   Stand pivot transfers: Max assist;+2 physical assistance       General transfer comment: Pt with short steps with SPT requiring MAX of 2 due to fatigue and weakness    Balance Overall balance assessment: Needs assistance Sitting-balance support: Feet supported;Single extremity supported Sitting balance-Leahy Scale: Poor Sitting balance - Comments: Pt sat at EOB with min/guard and progressed to needing more A as she fatigued and became more flexed and bent over.     Standing balance-Leahy Scale: Zero                              ADL Overall ADL's : Needs assistance/impaired Eating/Feeding: NPO   Grooming: Minimal assistance;Sitting;Wash/dry face;Brushing hair;Moderate assistance Grooming Details (indicate cue type and reason): washed face sitting EOB, able to comb hair, fatigues quickly in unsupported sitting Upper Body Bathing: Sitting;Maximal assistance   Lower Body Bathing: Maximal assistance;+2 for physical assistance;Sit to/from stand   Upper Body Dressing : Maximal assistance;Sitting   Lower Body Dressing: Maximal assistance;+2 for physical assistance;Sit to/from stand   Toilet Transfer: Maximal assistance;+2 for physical assistance;Stand-pivot;BSC Toilet Transfer Details (indicate cue type and reason): simulated with EOB>recliner Toileting- Clothing Manipulation and Hygiene: Maximal assistance;+2 for physical assistance;Sit to/from stand   Tub/ Shower Transfer: Maximal assistance;+2 for physical assistance;Stand-pivot;3 in 1     General ADL Comments: Pt completed bed mobility then sat EOB about 7-8 minutes to complete grooming tasks. Fatigues quickly in unsupported sitting with increased trunk flexion and UE support sitting EOB. +2 HHA max A to pivot  to recliner.      Vision     Perception     Praxis      Pertinent Vitals/Pain Pain Assessment: Faces Faces Pain Scale: Hurts a little bit Pain Location: unable to clarify, grimacing with  re-positioning Pain Descriptors / Indicators: Grimacing Pain Intervention(s): Monitored during session;Repositioned     Hand Dominance     Extremity/Trunk Assessment Upper Extremity Assessment Upper Extremity Assessment: Generalized weakness   Lower Extremity Assessment Lower Extremity Assessment: Defer to PT evaluation   Cervical / Trunk Assessment Cervical / Trunk Assessment: Kyphotic   Communication Communication Communication: HOH;Expressive difficulties   Cognition Arousal/Alertness: Awake/alert;Lethargic (more alert once sitting up in chair) Behavior During Therapy: Flat affect Overall Cognitive Status: History of cognitive impairments - at baseline                 General Comments: able to follow 1 step commands in functional context    General Comments    congested cough once sitting EOB. Nursing notified.    Exercises       Shoulder Instructions      Home Living Family/patient expects to be discharged to:: Unsure                                 Additional Comments: Pt has been at SNF last month ambulating with RW 150' with MIN to MIN/guard A level.        Prior Functioning/Environment Level of Independence: Needs assistance  Gait / Transfers Assistance Needed: Amb with SNF PT 50-100' min to min/guard last week ADL's / Homemaking Assistance Needed: min A UB ADLs, mod-max A with LB ADLs, minguard toilet transfer with rw, max A for pericare            OT Problem List: Decreased strength;Decreased activity tolerance;Impaired balance (sitting and/or standing);Decreased knowledge of use of DME or AE;Decreased knowledge of precautions   OT Treatment/Interventions: Self-care/ADL training;Energy conservation;DME and/or AE instruction;Therapeutic activities;Patient/family education;Balance training    OT Goals(Current goals can be found in the care plan section) Acute Rehab OT Goals Patient Stated Goal: pt wanting to get up to chair OT Goal  Formulation: With patient Time For Goal Achievement: 02/24/16 Potential to Achieve Goals: Fair ADL Goals Pt Will Perform Grooming: with min guard assist;sitting Pt Will Perform Upper Body Bathing: sitting;with min assist Pt Will Perform Lower Body Bathing: sit to/from stand;with max assist Pt Will Perform Upper Body Dressing: with mod assist;sitting Pt Will Perform Lower Body Dressing: with max assist;sit to/from stand Pt Will Transfer to Toilet: with mod assist;bedside commode;stand pivot transfer Pt Will Perform Toileting - Clothing Manipulation and hygiene: sit to/from stand;with max assist  OT Frequency: Min 2X/week   Barriers to D/C:            Co-evaluation PT/OT/SLP Co-Evaluation/Treatment: Yes Reason for Co-Treatment: For patient/therapist safety PT goals addressed during session: Mobility/safety with mobility OT goals addressed during session: ADL's and self-care      End of Session Equipment Utilized During Treatment: Gait belt Nurse Communication: Mobility status;Other (comment) (cough, up in chair with chair alarm on)  Activity Tolerance: Patient tolerated treatment well;Patient limited by fatigue Patient left: in chair;with call bell/phone within reach;with chair alarm set   Time: [id number] OT Time Calculation (min): 24 min Charges:  OT General Charges $OT Visit: 1 Procedure OT Evaluation $OT Eval Low Complexity: 1 Procedure G-Codes:    Pilar GrammesMathews, Tanina Barb H 02/10/2016, 2:34 PM

## 2016-02-10 NOTE — Telephone Encounter (Signed)
New message     Lorain ChildesFYI Son is calling to let Dr Shirlee LatchMcLean know pt is in the hosp and hospice will probable be taking over.  He said his mother thinks a lot of Dr Shirlee LatchMcLean and he wanted him to know about her condition.

## 2016-02-10 NOTE — Progress Notes (Signed)
[facility] PROGRESS NOTE  Dineen KidRuth M Krinke LKG:[id number] DOB: 03/21/1920 DOA: 02/09/2016  PCP: Shirline Freesory Nafziger, NP  Brief History/Interval Summary: 82 year old Caucasian female with a past medical history of congestive heart failure, likely diastolic, history of coronary artery disease, status post stent placement, advanced dementia, hypothyroidism, presented to the hospital with increasing somnolence and right-sided facial droop and extremity weakness. Patient resides at a skilled nursing facility. She was sent over to the hospital. There was concern for urinary tract infection as well. She was hospitalized for further management.  Reason for Visit: Acute encephalopathy. UTI. Possible aspiration pneumonia  Consultants: Palliative medicine  Procedures: None  Antibiotics: Unasyn  Subjective/Interval History: Patient opens her eyes when I call out her name. However, she does not respond any further. Unable to communicate. Her family is at the bedside including her son.  ROS: Unable to due to her mental status  Objective:  Vital Signs  Vitals:   02/09/16 1700 02/09/16 1754 02/09/16 2052 02/10/16 0539  BP:  (!) 194/64 (!) 163/55 (!) 157/47  Pulse:  62 62 (!) 53  Resp:  18 17 18   Temp:  98.1 F (36.7 C) 98.6 F (37 C) 97.8 F (36.6 C)  TempSrc:  Oral Oral Oral  SpO2:  94% 93% 92%  Weight: 52.3 kg (115 lb 4.8 oz)     Height: 5\' 1"  (1.549 m)       Intake/Output Summary (Last 24 hours) at 02/10/16 1222 Last data filed at 02/10/16 0900  Gross per 24 hour  Intake              930 ml  Output                4 ml  Net              926 ml   Filed Weights   02/09/16 1700  Weight: 52.3 kg (115 lb 4.8 oz)    General appearance: Not very responsive. Head: Normocephalic, without obvious abnormality, atraumatic Resp: Course breath sounds bilaterally. Few crackles at the bases. No wheezing. Cardio: regular rate and rhythm, S1, S2 normal, no murmur, click, rub or gallop GI:  soft, non-tender; bowel sounds normal; no masses,  no organomegaly Extremities: extremities normal, atraumatic, no cyanosis or edema Neurologic: Unresponsive for the most part  Lab Results:  Data Reviewed: I have personally reviewed following labs and imaging studies  CBC:  Recent Labs Lab 02/06/16 02/09/16 1311 02/10/16 0348  WBC 8.8 7.4 7.2  NEUTROABS  --  4.8  --   HGB 9.5* 10.4* 10.5*  HCT 27* 30.5* 31.3*  MCV  --  95.3 95.1  PLT 143* 142* 142*    Basic Metabolic Panel:  Recent Labs Lab 02/06/16 02/09/16 1311 02/10/16 0348  NA 133* 139 139  K 4.1 2.9* 3.3*  CL  --  98* 98*  CO2  --  31 33*  GLUCOSE  --  75 105*  BUN 28* 22* 17  CREATININE 1.3* 1.20* 1.20*  CALCIUM  --  8.8* 8.7*    GFR: Estimated Creatinine Clearance: 22.1 mL/min (by C-G formula based on SCr of 1.2 mg/dL (H)).  Liver Function Tests:  Recent Labs Lab 02/10/16 0348  AST 22  ALT 16  ALKPHOS 52  BILITOT 0.2*  PROT 5.5*  ALBUMIN 2.3*    Lipid Profile:  Recent Labs  02/10/16 0347  CHOL 110  HDL 53  LDLCALC 43  TRIG 70  CHOLHDL 2.1     Recent Results (  from the past 240 hour(s))  MRSA PCR Screening     Status: None   Collection Time: 02/09/16 10:11 PM  Result Value Ref Range Status   MRSA by PCR NEGATIVE NEGATIVE Final    Comment:        The GeneXpert MRSA Assay (FDA approved for NASAL specimens only), is one component of a comprehensive MRSA colonization surveillance program. It is not intended to diagnose MRSA infection nor to guide or monitor treatment for MRSA infections.       Radiology Studies: Ct Head Wo Contrast  Result Date: 02/09/2016 CLINICAL DATA:  Altered mental status. EXAM: CT HEAD WITHOUT CONTRAST TECHNIQUE: Contiguous axial images were obtained from the base of the skull through the vertex without intravenous contrast. COMPARISON:  CT scan of January 01, 2016. FINDINGS: Brain: Mild chronic ischemic white matter disease is noted. Mild diffuse  cortical atrophy is noted. No mass effect or midline shift is noted. Ventricular size is within normal limits. There is no evidence of mass lesion, hemorrhage or acute infarction. Vascular: Atherosclerosis of carotid siphons is noted. Skull: Normal. Negative for fracture or focal lesion. Sinuses/Orbits: Bilateral ethmoid and maxillary sinusitis is noted. Other: None. IMPRESSION: Mild chronic ischemic white matter disease. Mild diffuse cortical atrophy. No acute intracranial abnormality seen. Electronically Signed   By: Lupita Raider, M.D.   On: 02/09/2016 13:59   Mr Brain Wo Contrast  Result Date: 02/09/2016 CLINICAL DATA:  Increasing somnolence, RIGHT facial droop and extremity weakness for 3 days. Symptoms worsening for 1 day. History of dementia, frequent falls, hypertension, stroke. EXAM: MRI HEAD WITHOUT CONTRAST TECHNIQUE: Multiplanar, multiecho pulse sequences of the brain and surrounding structures were obtained without intravenous contrast. COMPARISON:  CT HEAD February 09, 2016 at 1330 hours an MRI head June 25, 2008 FINDINGS: Multiple sequences are mildly motion degraded. BRAIN: No reduced diffusion to suggest acute ischemia. No susceptibility artifact to suggest hemorrhage. Moderate to severe ventriculomegaly on the basis of global parenchymal brain volume loss. Patchy to confluent supratentorial white matter FLAIR T2 hyperintensities. Small LEFT temporal parietal cortical T2 hyperintensity. No suspicious parenchymal signal, masses or mass effect. No abnormal extra-axial fluid collections. No extra-axial masses though, contrast enhanced sequences would be more sensitive. VASCULAR: Normal major intracranial vascular flow voids present at skull base. SKULL AND UPPER CERVICAL SPINE: No abnormal sellar expansion. No suspicious calvarial bone marrow signal. Craniocervical junction maintained. SINUSES/ORBITS: Paranasal sinus mucosal thickening. Maxillary sinus atresia compatible chronic sinusitis.  Trace mastoid effusions. The included ocular globes and orbital contents are non-suspicious. OTHER: None. IMPRESSION: No acute intracranial process on this mildly motion degraded examination. Progressed moderate to severe global parenchymal brain volume loss and moderate to severe chronic small vessel ischemic disease. Old small LEFT temporal parietal/MCA territory infarcts. Electronically Signed   By: Awilda Metro M.D.   On: 02/09/2016 19:45   Dg Chest Port 1 View  Result Date: 02/09/2016 CLINICAL DATA:  Mental status change EXAM: PORTABLE CHEST 1 VIEW COMPARISON:  01/01/2016 FINDINGS: Mild bibasilar atelectasis. Negative for heart failure or edema. No pleural effusion. Right peritracheal density most likely vascular ectasia. Follow-up with two-view chest x-ray. Atherosclerotic aortic arch.  Heart size upper normal. IMPRESSION: Mild bibasilar atelectasis.  Negative for heart failure. Right peritracheal soft tissue density likely vascular ectasia. Followup two-view chest x-ray suggested. Electronically Signed   By: Marlan Palau M.D.   On: 02/09/2016 13:19     Medications:  Scheduled: . acetaminophen  500 mg Oral BID  . ampicillin-sulbactam (UNASYN) IV  3 g Intravenous Q12H  . aspirin EC  162 mg Oral Daily  . atorvastatin  20 mg Oral q1800  . carvedilol  3.125 mg Oral BID WC  . divalproex  250 mg Oral Daily  . divalproex  500 mg Oral QHS  . donepezil  10 mg Oral QHS  . guaiFENesin  1,200 mg Oral BID  . heparin  5,000 Units Subcutaneous Q8H  . levothyroxine  25 mcg Oral QAC breakfast  . risperiDONE  0.5 mg Oral BID  . sertraline  25 mg Oral Daily  . sodium chloride flush  3 mL Intravenous Q12H   Continuous: . dextrose 5 % and 0.9 % NaCl with KCl 40 mEq/L 75 mL/hr at 02/09/16 2138   VHQ:IONGEXBMPRN:atropine, ondansetron **OR** ondansetron (ZOFRAN) IV  Assessment/Plan:  Active Problems:   UTI (urinary tract infection)    Acute encephalopathy There was some concern for right-sided facial  droop and right-sided weakness. MRI brain was a poor study but did not show any acute stroke. I believe that her mental status changes are most likely due to combination of infection, as well as metabolic derangements as well as underlying dementia. Continue treating other conditions and wait for improvement. Palliative medicine been consulted. Appreciate their input.  Cough/possible aspiration pneumonia Could be due to aspiration. Patient is currently not very responsive. Leave nothing by mouth status for now. Swallow evaluation. Continue Unasyn due to concern for aspiration pneumonia.  Abnormal UA/UTI. Continue antibiotics. Follow up on urine cultures.  Hypokalemia Likely due to poor oral intake. Improved this morning. Continue potassium through IV fluids.  Hypothyroidism Continue synthroid  Dementia/Depression Continue depakote, risperdol, aricept, as and when able to take orally  H/o coronary artery disease status post and placement in the past continue ASA, lipitor , when able to take orally  Chronic diastolic congestive heart failure No evidence of decompensation. Continue gentle IV hydration for now due to no oral intake. Monitor volume status closely.  History of essential hypertension  Monitor blood pressures closely. Hydralazine as needed.  DVT Prophylaxis: Subcutaneous heparin    Code Status: DO NOT RESUSCITATE  Family Communication: Discussed with the patient's son  Disposition Plan: Management as outlined above. If the patient does not improve, then she may be a candidate for residential hospice.    LOS: 1 day   [facility] Children'S HospitalKRISHNAN,Airabella Barley  [facility] Pager [phone] 02/10/2016, 12:22 PM  If 7PM-7AM, please contact night-coverage at [email or web address], password [email or web address]

## 2016-02-10 NOTE — Telephone Encounter (Signed)
I will forward to Dr McLean. 

## 2016-02-10 NOTE — Consult Note (Signed)
Consultation Note Date: 02/10/2016   Patient Name: [name]  DOB: 16-Nov-1920  MRN: [id number]  Age / Sex: 82 y.o., female  PCP: Shirline Frees, NP Referring Physician: Osvaldo Shipper, MD  Reason for Consultation: Establishing goals of care  HPI/Patient Profile: 82 y.o. female     admitted on 02/09/2016     Clinical Assessment and Goals of Care:  82 year old lady with a past medical history significant for congestive heart failure, coronary artery disease/non-ST segment elevation MI status post stent placement, history of advanced dementia frequent falls, hypertension, hypothyroidism, history of multiple mini strokes. Patient was living independently with assistance from son and grandchildren up until November 2017. Subsequently, she required to be hospitalized and was placed at [facility] for rehabilitation. The patient was noted to have developed increasing somnolence, right-sided facial droop and right-sided extremity weakness. It is reported that the patient was noted to be leaning to the right and also had a facial droop. Hence, she was sent to the emergency department for evaluation. She was recently diagnosed with a urinary tract infection at the nursing home.  Patient was admitted to the hospital with working diagnosis of strokelike symptoms, possible TIA, advanced dementia and urinary tract infection. Patient was given IV fluids and IV antibiotics. MRI of the brain was done which showed extensive a trophy, old strokes but no acute cerebrovascular accident. Patient remains essentially unresponsive and not eating since admission. Hence, palliative care consultation for goals of care discussions.  Patient is a thin frail elderly lady resting in bed. She does not open eyes she does not follow commands she is nonverbal. Call placed and discussed with son [name] at [phone] who subsequently arrived at the  bedside. Family meeting ensued. Brief life review performed. Patient was living by herself up until November 2017 when she required hospitalization and was transitioned to [facility]. Family states that she has sundowning and sometimes in the evenings, she becomes hyper verbal. Introduce myself and palliative care as follows: Palliative medicine is specialized medical care for people living with serious illness. It focuses on providing relief from the symptoms and stress of a serious illness. The goal is to improve quality of life for both the patient and the family.  Patient's son [name] states that his main goal for the patient is to not suffer. Discussed about hospitalization course, discussed about disease trajectory in the setting of serious illness such as advanced dementia, recurrent transient ischemic attack events. Discussed about recurrent urinary tract infections and compounding the resultant encephalopathy. Introduced hospice as an extra layer of support. Questions pertaining to prognosis, disease trajectory discussed in detail. Concept of comfort feedings also discussed in detail. See recommendations below. We will continue to follow along. Thank you for the consult.  HCPOA  son [name] at [phone].   SUMMARY OF RECOMMENDATIONS    DNR DNI Monitor disease trajectory for the next 24 hours Residential hospice if patient is not able to be awake/alert, is not eating within the next day or two D/C back  to [facility] with hospice if the patient has some degree of clinical improvement in the next day or two.  Recommend speech therapy consult, recommend allowing comfort feeds with precautions, should the patient be reasonably more awake/alert.  Family is asking for gentle IVF hydration.   Code Status/Advance Care Planning:  DNR    Symptom Management:    continue current mode of care for now.   Palliative Prophylaxis:   Delirium Protocol  Psycho-social/Spiritual:   Desire for  further Chaplaincy support:no  Additional Recommendations: Education on Hospice  Prognosis:   < 6 months, could realistically be much shorter, even less than 2 weeks if patient continues to have ongoing decline.   Discharge Planning: Skilled Nursing Facility with Hospice versus residential hospice, based on trajectory.       Primary Diagnoses: Present on Admission: . UTI (urinary tract infection)   I have reviewed the medical record, interviewed the patient and family, and examined the patient. The following aspects are pertinent.  Past Medical History:  Diagnosis Date  . CHF (congestive heart failure) (HCC)    Diastolic CHF. Echo (3/10) EF 45-50% with periapical akinesis. Mild LVH. Mild MR. Pseudonormal diastolic function. Echo (4/10): EF 65% with mild focal basal septal hypertrophy. Pseudonormal diastolic function. Normal wall motion. Moderate biatrial enlargement. PASP 61 mmHg. Echo (4/11): EF 55%, inferobasal hypokinesis, mild MR, PA systolic pressure 44 mmHg.  [name]. Coronary artery disease    NSTEMI 3/10. LHC showed 80% mLAD, 90% mCFX. She had PCI with Endeavor 2.5 x 12 to LAD and Endeavor 3.0 x 12 to CFX  . Dementia   . Expressive aphasia syndrome    More likely atypical migraine than TIA. Carotid dopplers (8/09) with 40-60% LICA stenosis.  . Frequent falls   . Horner's syndrome    left  . Hypertension   . Hypothyroidism   . Lacunar stroke (HCC)    on CT  . SVT (supraventricular tachycardia) (HCC)    transient   Social History   Social History  . Marital status: Widowed    Spouse name: N/A  . Number of children: N/A  . Years of education: N/A   Social History Main Topics  . Smoking status: Former Smoker    Types: Cigarettes    Quit date: 05/25/1950  . Smokeless tobacco: Never Used  . Alcohol use No  . Drug use: No  . Sexual activity: Yes   Other Topics Concern  . None   Social History Narrative  . None   Family History  Problem Relation Age of Onset  .  Hypertension Mother   . COPD Father   . Heart attack Neg Hx    Scheduled Meds: . acetaminophen  500 mg Oral BID  . ampicillin-sulbactam (UNASYN) IV  3 g Intravenous Q12H  . aspirin EC  162 mg Oral Daily  . atorvastatin  20 mg Oral q1800  . carvedilol  3.125 mg Oral BID WC  . divalproex  250 mg Oral Daily  . divalproex  500 mg Oral QHS  . donepezil  10 mg Oral QHS  . guaiFENesin  1,200 mg Oral BID  . heparin  5,000 Units Subcutaneous Q8H  . irbesartan  150 mg Oral Daily  . levothyroxine  25 mcg Oral QAC breakfast  . risperiDONE  0.5 mg Oral BID  . sertraline  25 mg Oral Daily  . sodium chloride flush  3 mL Intravenous Q12H   Continuous Infusions: . dextrose 5 % and 0.9 % NaCl with KCl  40 mEq/L 75 mL/hr at 02/09/16 2138   PRN Meds:.atropine, furosemide, ondansetron **OR** ondansetron (ZOFRAN) IV Medications Prior to Admission:  Prior to Admission medications   Medication Sig Start Date End Date Taking? Authorizing Provider  acetaminophen (TYLENOL) 500 MG tablet Take 500 mg by mouth 2 (two) times daily.    Yes Historical Provider, MD  albuterol (PROVENTIL) (2.5 MG/3ML) 0.083% nebulizer solution Take 2.5 mg by nebulization every 6 (six) hours.   Yes Historical Provider, MD  aspirin EC 81 MG tablet Take 162 mg by mouth daily.   Yes Historical Provider, MD  atorvastatin (LIPITOR) 20 MG tablet Take 1 tablet (20 mg total) by mouth daily. 09/19/15  Yes Laurey Moralealton S McLean, MD  carvedilol (COREG) 3.125 MG tablet Take 3.125 mg by mouth 2 (two) times daily with a meal.   Yes Historical Provider, MD  divalproex (DEPAKOTE) 250 MG DR tablet One tab at morning, 2 tabs at night Patient taking differently: Take 250-500 mg by mouth See admin instructions. One tab at morning, 2 tabs at night 01/25/16  Yes Levert FeinsteinYijun Yan, MD  donepezil (ARICEPT) 10 MG tablet Take 1 tablet (10 mg total) by mouth at bedtime. 07/21/15  Yes Levert FeinsteinYijun Yan, MD  furosemide (LASIX) 20 MG tablet Take 1 tablet (20 mg total) by mouth  daily. Patient taking differently: Take 40 mg by mouth daily.  01/31/16  Yes Dinah C Ngetich, NP  levothyroxine (SYNTHROID, LEVOTHROID) 25 MCG tablet Take 1 tablet (25 mcg total) by mouth daily before breakfast. 01/05/16  Yes Omair Latif Sheikh, DO  nitroGLYCERIN (NITROSTAT) 0.4 MG SL tablet Place 0.4 mg under the tongue every 5 (five) minutes as needed for chest pain (3 DOSES MAX).   Yes Historical Provider, MD  risperiDONE (RISPERDAL) 0.5 MG tablet Take 1 tablet (0.5 mg total) by mouth 2 (two) times daily. 01/05/16  Yes Omair Latif Sheikh, DO  sertraline (ZOLOFT) 25 MG tablet Take 25 mg by mouth daily.   Yes Historical Provider, MD  valsartan (DIOVAN) 160 MG tablet Take 1 tablet (160 mg total) by mouth 2 (two) times daily. 10/24/15  Yes Rosalio MacadamiaLori C Gerhardt, NP  Vitamin D, Ergocalciferol, (DRISDOL) 50000 units CAPS capsule TAKE ONE CAPSULE BY MOUTH WEEKLY 11/16/15  Yes Shirline Freesory Nafziger, NP   No Known Allergies Review of Systems Non verbal, has gurgling respirations.   Physical Exam Weak frail elderly lady NAD Mild gurgling respirations Does not awaken, does not open eyes or follow commands S1 S2 Shallow clear breathing No edema Thin muscle wasting evident  Vital Signs: BP (!) 157/47 (BP Location: Right Arm)   Pulse (!) 53   Temp 97.8 F (36.6 C) (Oral)   Resp 18   Ht 5\' 1"  (1.549 m)   Wt 52.3 kg (115 lb 4.8 oz)   SpO2 92%   BMI 21.79 kg/m  Pain Assessment: PAINAD       SpO2: SpO2: 92 % O2 Device:SpO2: 92 % O2 Flow Rate: .   IO: Intake/output summary:  Intake/Output Summary (Last 24 hours) at 02/10/16 1039 Last data filed at 02/10/16 0742  Gross per 24 hour  Intake              930 ml  Output                4 ml  Net              926 ml    LBM: Last BM Date:  (PTA) Baseline Weight: Weight: 52.3 kg (115 lb  4.8 oz) Most recent weight: Weight: 52.3 kg (115 lb 4.8 oz)     Palliative Assessment/Data:   Flowsheet Rows   Flowsheet Row Most Recent Value  Intake Tab  Referral  Department  Hospitalist  Unit at Time of Referral  Med/Surg Unit  Palliative Care Primary Diagnosis  Neurology  Date Notified  02/09/16  Palliative Care Type  New Palliative care  Reason for referral  Clarify Goals of Care, Counsel Regarding Hospice  Date of Admission  02/09/16  Date first seen by Palliative Care  02/10/16  # of days IP prior to Palliative referral  0  Clinical Assessment  Palliative Performance Scale Score  20%  Pain Max last 24 hours  3  Pain Min Last 24 hours  2  Dyspnea Max Last 24 Hours  3  Dyspnea Min Last 24 hours  2  Nausea Max Last 24 Hours  3  Nausea Min Last 24 Hours  2  Anxiety Max Last 24 Hours  2  Psychosocial & Spiritual Assessment  Palliative Care Outcomes  Patient/Family meeting held?  Yes  Who was at the meeting?  patient's son [name], daughter in law, grand daughter.   Palliative Care Outcomes  Clarified goals of care      Time In:  9 Time Out: 10.30   Time Total:   90 min  Greater than 50%  of this time was spent counseling and coordinating care related to the above assessment and plan.  Signed by: Rosalin Hawking, MD  [phone]  Please contact Palliative Medicine Team phone at [phone] for questions and concerns.  For individual provider: See Loretha Stapler

## 2016-02-10 NOTE — Progress Notes (Signed)
Unable to administer 10 AM medicine due to patient's condition. Will continue to monitor.

## 2016-02-11 DIAGNOSIS — J690 Pneumonitis due to inhalation of food and vomit: Secondary | ICD-10-CM

## 2016-02-11 LAB — CBC
HEMATOCRIT: 33 % — AB (ref 36.0–46.0)
Hemoglobin: 11.1 g/dL — ABNORMAL LOW (ref 12.0–15.0)
MCH: 32.2 pg (ref 26.0–34.0)
MCHC: 33.6 g/dL (ref 30.0–36.0)
MCV: 95.7 fL (ref 78.0–100.0)
Platelets: 137 10*3/uL — ABNORMAL LOW (ref 150–400)
RBC: 3.45 MIL/uL — ABNORMAL LOW (ref 3.87–5.11)
RDW: 15.1 % (ref 11.5–15.5)
WBC: 6 10*3/uL (ref 4.0–10.5)

## 2016-02-11 LAB — BASIC METABOLIC PANEL
Anion gap: 11 (ref 5–15)
BUN: 13 mg/dL (ref 6–20)
CALCIUM: 9 mg/dL (ref 8.9–10.3)
CHLORIDE: 103 mmol/L (ref 101–111)
CO2: 26 mmol/L (ref 22–32)
CREATININE: 1 mg/dL (ref 0.44–1.00)
GFR calc Af Amer: 55 mL/min — ABNORMAL LOW (ref >60)
GFR calc non Af Amer: 47 mL/min — ABNORMAL LOW (ref >60)
GLUCOSE: 84 mg/dL (ref 65–99)
Potassium: 3.3 mmol/L — ABNORMAL LOW (ref 3.5–5.1)
Sodium: 140 mmol/L (ref 135–145)

## 2016-02-11 LAB — MAGNESIUM: Magnesium: 1.3 mg/dL — ABNORMAL LOW (ref 1.7–2.4)

## 2016-02-11 LAB — PROCALCITONIN: Procalcitonin: 0.49 ng/mL

## 2016-02-11 LAB — HEMOGLOBIN A1C
Hgb A1c MFr Bld: 5 % (ref 4.8–5.6)
Mean Plasma Glucose: 97 mg/dL

## 2016-02-11 MED ORDER — GUAIFENESIN 100 MG/5ML PO SOLN
30.0000 mL | Freq: Four times a day (QID) | ORAL | Status: DC
  Administered 2016-02-11 – 2016-02-13 (×7): 600 mg via ORAL
  Filled 2016-02-11 (×12): qty 30

## 2016-02-11 MED ORDER — DIVALPROEX SODIUM 125 MG PO CSDR
250.0000 mg | DELAYED_RELEASE_CAPSULE | Freq: Three times a day (TID) | ORAL | Status: DC
  Administered 2016-02-11 – 2016-02-12 (×5): 250 mg via ORAL
  Filled 2016-02-11 (×6): qty 2

## 2016-02-11 MED ORDER — ASPIRIN 81 MG PO CHEW
162.0000 mg | CHEWABLE_TABLET | Freq: Every day | ORAL | Status: DC
  Administered 2016-02-11 – 2016-02-13 (×3): 162 mg via ORAL
  Filled 2016-02-11 (×3): qty 2

## 2016-02-11 MED ORDER — RESOURCE THICKENUP CLEAR PO POWD
ORAL | Status: DC | PRN
  Filled 2016-02-11: qty 125

## 2016-02-11 NOTE — Progress Notes (Signed)
Daily Progress Note   Patient Name: [name]       Date: 02/11/2016 DOB: 03/08/1920  Age: 82 y.o. MRN#: [id number] Attending Physician: Osvaldo ShipperGokul Krishnan, MD Primary Care Physician: Shirline Freesory Nafziger, NP Admit Date: 02/09/2016  Reason for Consultation/Follow-up: Establishing goals of care  Subjective:  patient is much more awake alert She tracks me in the room Appears in no distress  Speech therapy present in the room, appreciate their input   Length of Stay: 2  Current Medications: Scheduled Meds:  . acetaminophen  500 mg Oral BID  . ampicillin-sulbactam (UNASYN) IV  3 g Intravenous Q12H  . aspirin  162 mg Oral Daily  . atorvastatin  20 mg Oral q1800  . carvedilol  3.125 mg Oral BID WC  . divalproex  250 mg Oral TID  . donepezil  10 mg Oral QHS  . guaiFENesin  30 mL Oral QID  . heparin  5,000 Units Subcutaneous Q8H  . levothyroxine  25 mcg Oral QAC breakfast  . risperiDONE  0.5 mg Oral BID  . sertraline  25 mg Oral Daily  . sodium chloride flush  3 mL Intravenous Q12H    Continuous Infusions: . dextrose 5 % and 0.9 % NaCl with KCl 40 mEq/L 75 mL/hr at 02/11/16 0548    PRN Meds: atropine, hydrALAZINE, ondansetron **OR** ondansetron (ZOFRAN) IV  Physical Exam         NAD Does not appear to have increased work of breathing S1 S2 Abdomen non distended No edema  Vital Signs: BP (!) 159/50   Pulse 63   Temp 97.9 F (36.6 C) (Oral)   Resp 17   Ht 5\' 1"  (1.549 m)   Wt 52.3 kg (115 lb 4.8 oz)   SpO2 95%   BMI 21.79 kg/m  SpO2: SpO2: 95 % O2 Device: O2 Device: Not Delivered O2 Flow Rate:    Intake/output summary:   Intake/Output Summary (Last 24 hours) at 02/11/16 1104 Last data filed at 02/11/16 0659  Gross per 24 hour  Intake          2028.75 ml  Output                 6 ml  Net          2022.75 ml   LBM: Last BM Date:  02/10/16 Baseline Weight: Weight: 52.3 kg (115 lb 4.8 oz) Most recent weight: Weight: 52.3 kg (115 lb 4.8 oz)       Palliative Assessment/Data:    Flowsheet Rows   Flowsheet Row Most Recent Value  Intake Tab  Referral Department  Hospitalist  Unit at Time of Referral  Med/Surg Unit  Palliative Care Primary Diagnosis  Neurology  Date Notified  02/09/16  Palliative Care Type  Return patient Palliative Care  Reason for referral  Clarify Goals of Care, Counsel Regarding Hospice  Date of Admission  02/09/16  Date first seen by Palliative Care  02/10/16  # of days IP prior to Palliative referral  0  Clinical Assessment  Palliative Performance Scale Score  30%  Pain Max last 24 hours  3  Pain Min Last 24 hours  2  Dyspnea Max Last 24 Hours  3  Dyspnea Min Last 24 hours  2  Nausea Max Last 24 Hours  3  Nausea Min Last 24 Hours  2  Anxiety Max Last 24 Hours  2  Psychosocial & Spiritual Assessment  Palliative Care Outcomes  Patient/Family meeting held?  Yes  Who was at the meeting?  patient's son [name], daughter in law, grand daughter.   Palliative Care Outcomes  Clarified goals of care  Palliative Care follow-up planned  Yes, Facility      Patient Active Problem List   Diagnosis Date Noted  . Hypokalemia   . Acute cystitis without hematuria   . Encephalopathy acute   . Stroke-like symptoms   . CKD (chronic kidney disease) stage 3, GFR 30-59 ml/min   . Alzheimer's dementia without behavioral disturbance   . Dementia without behavioral disturbance   . Troponin I above reference range 01/01/2016  . UTI (urinary tract infection) 01/01/2016  . General weakness 01/01/2016  . Abnormality of gait 02/07/2015  . Memory loss 07/17/2013  . Seizure disorder, focal motor (HCC) 03/26/2012  . Carotid stenosis 06/26/2011  . Depression 05/10/2010  . HYPERLIPIDEMIA-MIXED 11/05/2008  . Chronic diastolic CHF (congestive heart  failure) (HCC) 09/17/2008  . UNSPECIFIED VENOUS INSUFFICIENCY 06/09/2008  . CORONARY ATHEROSCLEROSIS NATIVE CORONARY ARTERY 05/12/2008  . Hypothyroidism 12/05/2007  . CONSTIPATION, DRUG INDUCED 09/02/2007  . BREAST CYST 09/02/2007  . UNS ADVRS EFF UNS RX MEDICINAL&BIOLOGICAL SBSTNC 06/04/2007  . Essential hypertension 07/25/2006  . OSTEOPOROSIS 07/25/2006  . CEREBROVASCULAR ACCIDENT, HX OF 07/25/2006    Palliative Care Assessment & Plan   Patient Profile:  82 year old Caucasian female with a past medical history of congestive heart failure, likely diastolic, history of coronary artery disease, status post stent placement, advanced dementia, hypothyroidism, presented to the hospital with increasing somnolence and right-sided facial droop and extremity weakness. Patient resides at a skilled nursing facility. She was sent over to the hospital. There was concern for urinary tract infection as well. She was hospitalized for further management.  Assessment:    Recommendations/Plan:     Code Status:    Code Status Orders        Start     Ordered   02/09/16 1646  Do not attempt resuscitation (DNR)  Continuous    Question Answer Comment  In the event of cardiac or respiratory ARREST Do not call a "code blue"   In the event of cardiac or respiratory ARREST Do not perform Intubation, CPR, defibrillation or ACLS   In the event of cardiac or respiratory ARREST Use medication by any route, position, wound care, and other measures to relive pain and suffering.  May use oxygen, suction and manual treatment of airway obstruction as needed for comfort.      02/09/16 1645    Code Status History    Date Active Date Inactive Code Status Order ID Comments User Context   02/09/2016  4:43 PM 02/09/2016  4:45 PM Full Code [id number]  Ozella Rocksavid J Merrell, MD ED   01/01/2016  2:03 PM 01/05/2016  5:53 PM DNR [id number]  Clydia LlanoMutaz Elmahi, MD Inpatient       Prognosis:   guarded, likely not more than weeks to  months.   Discharge Planning:  Skilled Nursing Facility with Hospice  Care plan was discussed with speech therapy present in the room, discussed about family's goals for comfort feeds no mechanical means such as PEG etc.    Thank you for allowing the Palliative Medicine Team to assist in the care of this patient.   Time In: 10.30 Time Out: 10.55 Total Time 25 Prolonged Time Billed  no       Greater than 50%  of this time was spent counseling and coordinating care related to the above assessment and plan.  Rosalin HawkingZeba Glendia Olshefski, MD [phone]  Please contact Palliative Medicine Team phone at [phone] for questions and concerns.

## 2016-02-11 NOTE — [place] FL2 (Signed)
[place] MEDICAID FL2 LEVEL OF CARE SCREENING TOOL     IDENTIFICATION  Patient Name: [name] Birthdate: 08/23/1920 Sex: female Admission Date (Current Location): 02/09/2016  [facility] [place]   Facility and Address:  [facility] [place] [facility], [address], [place], [place] [address]      Provider Number: [id number]  Attending Physician Name and Address:  Osvaldo ShipperGokul Krishnan, MD  Relative Name and Phone Number:  Maureen ChattersRichard Vaden, [phone]    Current Level of Care: Hospital Recommended Level of Care: Skilled Nursing Facility Prior Approval Number:    Date Approved/Denied:   PASRR Number: [id number] [id number]  Discharge Plan: SNF    Current Diagnoses: Patient Active Problem List   Diagnosis Date Noted  . Aspiration pneumonia (HCC)   . Hypokalemia   . Acute cystitis without hematuria   . Encephalopathy acute   . Stroke-like symptoms   . CKD (chronic kidney disease) stage 3, GFR 30-59 ml/min   . Alzheimer's dementia without behavioral disturbance   . Dementia without behavioral disturbance   . Troponin I above reference range 01/01/2016  . UTI (urinary tract infection) 01/01/2016  . General weakness 01/01/2016  . Abnormality of gait 02/07/2015  . Memory loss 07/17/2013  . Seizure disorder, focal motor (HCC) 03/26/2012  . Carotid stenosis 06/26/2011  . Depression 05/10/2010  . HYPERLIPIDEMIA-MIXED 11/05/2008  . Chronic diastolic CHF (congestive heart failure) (HCC) 09/17/2008  . UNSPECIFIED VENOUS INSUFFICIENCY 06/09/2008  . CORONARY ATHEROSCLEROSIS NATIVE CORONARY ARTERY 05/12/2008  . Hypothyroidism 12/05/2007  . CONSTIPATION, DRUG INDUCED 09/02/2007  . BREAST CYST 09/02/2007  . UNS ADVRS EFF UNS RX MEDICINAL&BIOLOGICAL SBSTNC 06/04/2007  . Essential hypertension 07/25/2006  . OSTEOPOROSIS 07/25/2006  . CEREBROVASCULAR ACCIDENT, HX OF 07/25/2006    Orientation RESPIRATION BLADDER Height & Weight     Self  Normal Incontinent  Weight: 115 lb 4.8 oz (52.3 kg) Height:  5\' 1"  (154.9 cm)  BEHAVIORAL SYMPTOMS/MOOD NEUROLOGICAL BOWEL NUTRITION STATUS      Incontinent Diet (DSY1 Nectar Thick)  AMBULATORY STATUS COMMUNICATION OF NEEDS Skin   Extensive Assist Verbally Normal                       Personal Care Assistance Level of Assistance  Bathing, Feeding, Dressing Bathing Assistance: Limited assistance Feeding assistance: Limited assistance Dressing Assistance: Limited assistance     Functional Limitations Info  Sight, Hearing, Speech Sight Info: Impaired Hearing Info: Impaired Speech Info: Impaired    SPECIAL CARE FACTORS FREQUENCY  PT (By licensed PT), OT (By licensed OT)     PT Frequency: 5x week OT Frequency: 5x week            Contractures Contractures Info: Not present    Additional Factors Info  Code Status Code Status Info: DNR             Current Medications (02/11/2016):  This is the current hospital active medication list Current Facility-Administered Medications  Medication Dose Route Frequency Provider Last Rate Last Dose  . acetaminophen (TYLENOL) tablet 500 mg  500 mg Oral BID Ozella Rocksavid J Merrell, MD   500 mg at 02/11/16 0956  . Ampicillin-Sulbactam (UNASYN) 3 g in sodium chloride 0.9 % 100 mL IVPB  3 g Intravenous Q12H Almon HerculesHaley P Baird, RPH   3 g at 02/11/16 0548  . aspirin chewable tablet 162 mg  162 mg Oral Daily Hillis Rangemily A Stewart, RPH   162 mg at 02/11/16 0957  . atorvastatin (LIPITOR) tablet  20 mg  20 mg Oral q1800 Ozella Rocksavid J Merrell, MD   20 mg at 02/10/16 1700  . atropine 1 % ophthalmic solution 1 drop  1 drop Sublingual Q4H PRN Shaune Pollackameron Isaacs, MD      . dextrose 5 % and 0.9 % NaCl with KCl 40 mEq/L infusion   Intravenous Continuous Ozella Rocksavid J Merrell, MD 75 mL/hr at 02/11/16 [id number]    . divalproex (DEPAKOTE SPRINKLE) capsule 250 mg  250 mg Oral TID Hillis RangeEmily A Stewart, RPH   250 mg at 02/11/16 1003  . donepezil (ARICEPT) tablet 10 mg  10 mg Oral QHS Ozella Rocksavid J Merrell, MD   10 mg at  02/10/16 2222  . guaiFENesin (ROBITUSSIN) 100 MG/5ML solution 600 mg  30 mL Oral QID Hillis RangeEmily A Stewart, RPH   600 mg at 02/11/16 1443  . heparin injection 5,000 Units  5,000 Units Subcutaneous Q8H Ozella Rocksavid J Merrell, MD   5,000 Units at 02/11/16 1314  . hydrALAZINE (APRESOLINE) injection 5 mg  5 mg Intravenous Q6H PRN Osvaldo ShipperGokul Krishnan, MD   5 mg at 02/11/16 0601  . levothyroxine (SYNTHROID, LEVOTHROID) tablet 25 mcg  25 mcg Oral QAC breakfast Osvaldo ShipperGokul Krishnan, MD   25 mcg at 02/11/16 0741  . ondansetron (ZOFRAN) tablet 4 mg  4 mg Oral Q6H PRN Ozella Rocksavid J Merrell, MD       Or  . ondansetron [facility](ZOFRAN) injection 4 mg  4 mg Intravenous Q6H PRN Ozella Rocksavid J Merrell, MD      . risperiDONE (RISPERDAL) tablet 0.5 mg  0.5 mg Oral BID Ozella Rocksavid J Merrell, MD   0.5 mg at 02/11/16 [id number]  . sertraline (ZOLOFT) tablet 25 mg  25 mg Oral Daily Ozella Rocksavid J Merrell, MD   25 mg at 02/11/16 [id number]  . sodium chloride flush (NS) 0.9 % injection 3 mL  3 mL Intravenous Q12H Ozella Rocksavid J Merrell, MD   3 mL at 02/09/16 2144     Discharge Medications: Please see discharge summary for a list of discharge medications.  Relevant Imaging Results:  Relevant Lab Results:   Additional Information SS#[id number]  Althea CharonAshley C Amzie Sillas, LCSW

## 2016-02-11 NOTE — Progress Notes (Signed)
Central tele notified writer that patient's HR dropped into the 40s and then recovered to the 60s. Patient asymptomatic. Dr. Rito EhrlichKrishnan notified at the bedside. Will continue to monitor. Lenord CarboAubrey  Dezi Brauner

## 2016-02-11 NOTE — Clinical Social Work Note (Signed)
Clinical Social Work Assessment  Patient Details  Name: [name] MRN: [id number] Date of Birth: 1920/04/18  Date of referral:  02/11/16               Reason for consult:  Discharge Planning                Permission sought to share information with:  Family Supports Permission granted to share information::  Yes, Verbal Permission Granted  Name::     Mecca Guitron  Agency::     Relationship::  son  Contact Information:  [phone]  Housing/Transportation Living arrangements for the past 2 months:  [place] of Information:  Adult Children Patient Interpreter Needed:  None Criminal Activity/Legal Involvement Pertinent to Current Situation/Hospitalization:  No - Comment as needed Significant Relationships:  Adult Children Lives with:  Facility Resident Do you feel safe going back to the place where you live?  Yes Need for family participation in patient care:  No (Coment)  Care giving concerns: No family/friends at bedside. Patients son is supportive  Facilities manager / plan:  Holiday representative met patient at the bedside to offer support and discuss patients needs at discharge. Patient unable to do assessment at this time. CSW spoke to patients son Delfino Lovett and discussed discharge plan with him. CSW spoke to Dr. Rowe Pavy via phone about recommendation to SNF placement with hospice care. CSW made Dr.Anwar aware that patient insurance will not cover SNF stay with hospice but will cover it with palliative. Dr.Anwar stated she will reassess patient tomorrow 12/17 and make final decision. CSW made patient's son aware of conversation with Dr. Rowe Pavy regarding insurance coverage. Richard stated that if insurance will cover palliative he would like patient to discharge back to [facility] with palliative care.   Employment status:  Retired Forensic scientist:  Medicare PT Recommendations:  [place] / Referral to community  resources:  [place]  Patient/Family's Response to care:  Patient verbalized appreciation and understanding for CSW role and involvement in care. Patient family agreeable with current discharge plan to SNF following doctor's final assessment   Patient/Family's Understanding of and Emotional Response to Diagnosis, Current Treatment, and Prognosis:  Patient's family with good understanding of current medical state and limitations around most recent hospitalization. Patient agreeable with SNF placement .   Emotional Assessment Appearance:  Appears stated age Attitude/Demeanor/Rapport:  Unable to Assess Affect (typically observed):  Unable to Assess Orientation:  Oriented to Self Alcohol / Substance use:  Not Applicable Psych involvement (Current and /or in the community):  No (Comment)  Discharge Needs  Concerns to be addressed:  No discharge needs identified Readmission within the last 30 days:  No Current discharge risk:  None Barriers to Discharge:  No Barriers Identified   Rhea Pink, MSW,  [place]

## 2016-02-11 NOTE — Progress Notes (Signed)
Speech Language Pathology Treatment: Dysphagia  Patient Details Name: Dineen KidRuth M Coberly MRN: [id number] DOB: 06/20/1920 Today's Date: 02/11/2016 Time: [id number] SLP Time Calculation (min) (ACUTE ONLY): 35 min  Assessment / Plan / Recommendation Clinical Impression  Patient was seen to address dysphagia goals and determine readiness for PO.'s. Patient is much more alert able to follow commands to open mouth and say 'ah' as compared to recent notes in chart. Patient exhibited swallow initiation delays, impaired mastication of Dys 2/3 textures, delayed coughing with thin liquids via cup sips. She tolerated puree solids with trace oral residuals post swallow and did not exhibit any overt s/s of aspiration with cup sips of nectar thick liquids.She did demonstrate productive cough and ability to move phlegm from pharynx anteriorly in oral cavity and then SLP would suction out.   HPI HPI: [name] a 82 y.o.femalewith medical history significant of CHF, CAD/NSTEMIstatus post stent placement, advanced dementia, frequent falls, hypertension, hypothyroidism, stroke, transient SVT presenting with increasing somnolence and right sided facial droop and extremity weakness. Son concerned that pt is aspirating during meals over last couple of days. Palliative consult 02/10/16; family plans to transition to residential hospice ifpatient is not able to be awake/alert, is not eating within the next day or two.      SLP Plan  Continue with current plan of care     Recommendations  Diet recommendations: Nectar-thick liquid Liquids provided via: Cup Medication Administration: Crushed with puree Supervision: Full supervision/cueing for compensatory strategies;Trained caregiver to feed patient Compensations: Slow rate;Small sips/bites;Minimize environmental distractions;Lingual sweep for clearance of pocketing Postural Changes and/or Swallow Maneuvers: Seated upright 90 degrees                Oral Care  Recommendations: Oral care QID Follow up Recommendations:  (TBD) Plan: Continue with current plan of care       GO              Angela NevinJohn T. Preston, MA, CCC-SLP 02/11/16 4:30 PM

## 2016-02-11 NOTE — Progress Notes (Signed)
[facility] PROGRESS NOTE  [name] WUJ:[id number] DOB: 01/18/1921 DOA: 02/09/2016  PCP: Shirline Freesory Nafziger, NP  Brief History/Interval Summary: 82 year old Caucasian female with a past medical history of congestive heart failure, likely diastolic, history of coronary artery disease, status post stent placement, advanced dementia, hypothyroidism, presented to the hospital with increasing somnolence and right-sided facial droop and extremity weakness. Patient resides at a skilled nursing facility. She was sent over to the hospital. There was concern for urinary tract infection as well. She was hospitalized for further management.  Reason for Visit: Acute encephalopathy. UTI. Possible aspiration pneumonia  Consultants: Palliative medicine  Procedures: None  Antibiotics: Unasyn  Subjective/Interval History: Patient is awake and alert today. Continues to have a cough. She remains confused, however.   ROS: Unable to due to her mental status  Objective:  Vital Signs  Vitals:   02/10/16 1439 02/10/16 2127 02/11/16 0554 02/11/16 0741  BP: (!) 155/59 (!) 196/59 (!) 209/58 (!) 159/50  Pulse: (!) 59 65 61 63  Resp: 18 17 17    Temp: 98.1 F (36.7 C) 98.1 F (36.7 C) 97.9 F (36.6 C)   TempSrc: Oral Oral Oral   SpO2: 91% 94% 95%   Weight:      Height:        Intake/Output Summary (Last 24 hours) at 02/11/16 0858 Last data filed at 02/11/16 0659  Gross per 24 hour  Intake          2028.75 ml  Output                8 ml  Net          2020.75 ml   Filed Weights   02/09/16 1700  Weight: 52.3 kg (115 lb 4.8 oz)    General appearance: Awake and alert. Does answer some of my questions, but remains confused and distracted.[name]. Resp: Course breath sounds bilaterally. crackles at the bases. No wheezing. Cardio: regular rate and rhythm, S1, S2 normal, no murmur, click, rub or gallop GI: soft, non-tender; bowel sounds normal; no masses,  no organomegaly Neurologic: Awake, alert  this morning, but distracted. No obvious deficits noted.  Lab Results:  Data Reviewed: I have personally reviewed following labs and imaging studies  CBC:  Recent Labs Lab 02/06/16 02/09/16 1311 02/10/16 0348 02/11/16 0457  WBC 8.8 7.4 7.2 6.0  NEUTROABS  --  4.8  --   --   HGB 9.5* 10.4* 10.5* 11.1*  HCT 27* 30.5* 31.3* 33.0*  MCV  --  95.3 95.1 95.7  PLT 143* 142* 142* 137*    Basic Metabolic Panel:  Recent Labs Lab 02/06/16 02/09/16 1311 02/10/16 0348 02/11/16 0457  NA 133* 139 139 140  K 4.1 2.9* 3.3* 3.3*  CL  --  98* 98* 103  CO2  --  31 33* 26  GLUCOSE  --  75 105* 84  BUN 28* 22* 17 13  CREATININE 1.3* 1.20* 1.20* 1.00  CALCIUM  --  8.8* 8.7* 9.0    GFR: Estimated Creatinine Clearance: 26.5 mL/min (by C-G formula based on SCr of 1 mg/dL).  Liver Function Tests:  Recent Labs Lab 02/10/16 0348  AST 22  ALT 16  ALKPHOS 52  BILITOT 0.2*  PROT 5.5*  ALBUMIN 2.3*    Lipid Profile:  Recent Labs  02/10/16 0347  CHOL 110  HDL 53  LDLCALC 43  TRIG 70  CHOLHDL 2.1     Recent Results (from the past 240 hour(s))  MRSA PCR  Screening     Status: None   Collection Time: 02/09/16 10:11 PM  Result Value Ref Range Status   MRSA by PCR NEGATIVE NEGATIVE Final    Comment:        The GeneXpert MRSA Assay (FDA approved for NASAL specimens only), is one component of a comprehensive MRSA colonization surveillance program. It is not intended to diagnose MRSA infection nor to guide or monitor treatment for MRSA infections.       Radiology Studies: Ct Head Wo Contrast  Result Date: 02/09/2016 CLINICAL DATA:  Altered mental status. EXAM: CT HEAD WITHOUT CONTRAST TECHNIQUE: Contiguous axial images were obtained from the base of the skull through the vertex without intravenous contrast. COMPARISON:  CT scan of January 01, 2016. FINDINGS: Brain: Mild chronic ischemic white matter disease is noted. Mild diffuse cortical atrophy is noted. No mass effect  or midline shift is noted. Ventricular size is within normal limits. There is no evidence of mass lesion, hemorrhage or acute infarction. Vascular: Atherosclerosis of carotid siphons is noted. Skull: Normal. Negative for fracture or focal lesion. Sinuses/Orbits: Bilateral ethmoid and maxillary sinusitis is noted. Other: None. IMPRESSION: Mild chronic ischemic white matter disease. Mild diffuse cortical atrophy. No acute intracranial abnormality seen. Electronically Signed   By: Lupita RaiderJames  Green Jr, M.D.   On: 02/09/2016 13:59   Mr Brain Wo Contrast  Result Date: 02/09/2016 CLINICAL DATA:  Increasing somnolence, RIGHT facial droop and extremity weakness for 3 days. Symptoms worsening for 1 day. History of dementia, frequent falls, hypertension, stroke. EXAM: MRI HEAD WITHOUT CONTRAST TECHNIQUE: Multiplanar, multiecho pulse sequences of the brain and surrounding structures were obtained without intravenous contrast. COMPARISON:  CT HEAD February 09, 2016 at 1330 hours an MRI head June 25, 2008 FINDINGS: Multiple sequences are mildly motion degraded. BRAIN: No reduced diffusion to suggest acute ischemia. No susceptibility artifact to suggest hemorrhage. Moderate to severe ventriculomegaly on the basis of global parenchymal brain volume loss. Patchy to confluent supratentorial white matter FLAIR T2 hyperintensities. Small LEFT temporal parietal cortical T2 hyperintensity. No suspicious parenchymal signal, masses or mass effect. No abnormal extra-axial fluid collections. No extra-axial masses though, contrast enhanced sequences would be more sensitive. VASCULAR: Normal major intracranial vascular flow voids present at skull base. SKULL AND UPPER CERVICAL SPINE: No abnormal sellar expansion. No suspicious calvarial bone marrow signal. Craniocervical junction maintained. SINUSES/ORBITS: Paranasal sinus mucosal thickening. Maxillary sinus atresia compatible chronic sinusitis. Trace mastoid effusions. The included ocular  globes and orbital contents are non-suspicious. OTHER: None. IMPRESSION: No acute intracranial process on this mildly motion degraded examination. Progressed moderate to severe global parenchymal brain volume loss and moderate to severe chronic small vessel ischemic disease. Old small LEFT temporal parietal/MCA territory infarcts. Electronically Signed   By: Awilda Metroourtnay  Bloomer M.D.   On: 02/09/2016 19:45   Dg Chest Port 1 View  Result Date: 02/09/2016 CLINICAL DATA:  Mental status change EXAM: PORTABLE CHEST 1 VIEW COMPARISON:  01/01/2016 FINDINGS: Mild bibasilar atelectasis. Negative for heart failure or edema. No pleural effusion. Right peritracheal density most likely vascular ectasia. Follow-up with two-view chest x-ray. Atherosclerotic aortic arch.  Heart size upper normal. IMPRESSION: Mild bibasilar atelectasis.  Negative for heart failure. Right peritracheal soft tissue density likely vascular ectasia. Followup two-view chest x-ray suggested. Electronically Signed   By: Marlan Palauharles  Clark M.D.   On: 02/09/2016 13:19     Medications:  Scheduled: . acetaminophen  500 mg Oral BID  . ampicillin-sulbactam (UNASYN) IV  3 g Intravenous Q12H  . aspirin  162 mg Oral Daily  . atorvastatin  20 mg Oral q1800  . carvedilol  3.125 mg Oral BID WC  . divalproex  250 mg Oral TID  . donepezil  10 mg Oral QHS  . guaiFENesin  30 mL Oral QID  . heparin  5,000 Units Subcutaneous Q8H  . levothyroxine  25 mcg Oral QAC breakfast  . risperiDONE  0.5 mg Oral BID  . sertraline  25 mg Oral Daily  . sodium chloride flush  3 mL Intravenous Q12H   Continuous: . dextrose 5 % and 0.9 % NaCl with KCl 40 mEq/L 75 mL/hr at 02/11/16 0548   MWN:UUVOZDGU, hydrALAZINE, ondansetron **OR** ondansetron (ZOFRAN) IV  Assessment/Plan:  Active Problems:   UTI (urinary tract infection)   Hypokalemia    Acute encephalopathy There was some concern for right-sided facial droop and right-sided weakness. MRI brain was a poor  study but did not show any acute stroke. Her mental status changes are most likely due to combination of infection, as well as metabolic derangements as well as underlying dementia. Mental status has improved this morning. Continue to treat as below. Despite improvement in mental status, prognosis remains poor. Appreciate palliative medicine input.  Cough/possible aspiration pneumonia Could be due to aspiration. Leave nothing by mouth status for now. Swallow evaluation. Continue Unasyn due to concern for aspiration pneumonia.  Abnormal UA/UTI. Continue antibiotics. Gram-negative rods noted on urine culture. Await final identification and sensitivities..  Hypokalemia Likely due to poor oral intake. Continue potassium through IV fluids.  Hypothyroidism Continue synthroid  Dementia/Depression Continue depakote, risperdol, aricept, as and when able to take orally  H/o coronary artery disease status post and placement in the past continue ASA, lipitor , when able to take orally  Chronic diastolic congestive heart failure No evidence of decompensation. Continue gentle IV hydration for now due to no oral intake. Monitor volume status closely. Noted to be bradycardic. Heart rate occasionally drops into the 30s and 40s. We will stop her beta blocker.  History of essential hypertension  Monitor blood pressures closely. Hydralazine as needed.  DVT Prophylaxis: Subcutaneous heparin    Code Status: DO NOT RESUSCITATE  Family Communication: Discussed with the patient's son  Disposition Plan: Management as outlined above. Patient has improved some. Continue to monitor for now. Disposition per palliative medicine. But it appears that she may end up going to skilled nursing facility with hospice.    LOS: 2 days   [facility]  [facility] Pager [phone] 02/11/2016, 8:58 AM  If 7PM-7AM, please contact night-coverage at [email or web address], password [facility]

## 2016-02-11 NOTE — Clinical Social Work Placement (Signed)
   CLINICAL SOCIAL WORK PLACEMENT  NOTE  Date:  02/11/2016  Patient Details  Name: [name] MRN: [id number] Date of Birth: 04/04/1920  Clinical Social Work is seeking post-discharge placement for this patient at the Skilled  Nursing Facility level of care (*CSW will initial, date and re-position this form in  chart as items are completed):  Yes   Patient/family provided with [facility] Clinical Social Work Department's list of facilities offering this level of care within the geographic area requested by the patient (or if unable, by the patient's family).  Yes   Patient/family informed of their freedom to choose among providers that offer the needed level of care, that participate in Medicare, Medicaid or managed care program needed by the patient, have an available bed and are willing to accept the patient.  Yes   Patient/family informed of [facility]'s ownership interest in [facility] and [facility], as well as of the fact that they are under no obligation to receive care at these facilities.  PASRR submitted to EDS on       PASRR number received on       Existing PASRR number confirmed on 02/11/16     FL2 transmitted to all facilities in geographic area requested by pt/family on       FL2 transmitted to all facilities within larger geographic area on       Patient informed that his/her managed care company has contracts with or will negotiate with certain facilities, including the following:            Patient/family informed of bed offers received.  Patient chooses bed at       Physician recommends and patient chooses bed at      Patient to be transferred to   on  .  Patient to be transferred to facility by       Patient family notified on   of transfer.  Name of family member notified:        PHYSICIAN Please sign FL2     Additional Comment:    _______________________________________________ Althea CharonAshley C Xane Amsden, LCSW 02/11/2016, 3:23 PM

## 2016-02-12 LAB — BASIC METABOLIC PANEL
ANION GAP: 9 (ref 5–15)
BUN: 17 mg/dL (ref 6–20)
CHLORIDE: 109 mmol/L (ref 101–111)
CO2: 21 mmol/L — AB (ref 22–32)
Calcium: 8.8 mg/dL — ABNORMAL LOW (ref 8.9–10.3)
Creatinine, Ser: 1.11 mg/dL — ABNORMAL HIGH (ref 0.44–1.00)
GFR calc Af Amer: 48 mL/min — ABNORMAL LOW (ref >60)
GFR calc non Af Amer: 41 mL/min — ABNORMAL LOW (ref >60)
GLUCOSE: 122 mg/dL — AB (ref 65–99)
POTASSIUM: 4.5 mmol/L (ref 3.5–5.1)
Sodium: 139 mmol/L (ref 135–145)

## 2016-02-12 LAB — URINE CULTURE

## 2016-02-12 MED ORDER — MAGNESIUM SULFATE 2 GM/50ML IV SOLN
2.0000 g | Freq: Once | INTRAVENOUS | Status: AC
  Administered 2016-02-12: 2 g via INTRAVENOUS
  Filled 2016-02-12: qty 50

## 2016-02-12 MED ORDER — AMOXICILLIN-POT CLAVULANATE 875-125 MG PO TABS: 1.0000 | ORAL_TABLET | Freq: Two times a day (BID) | ORAL | Status: DC

## 2016-02-12 MED ORDER — AMOXICILLIN-POT CLAVULANATE 500-125 MG PO TABS
1.0000 | ORAL_TABLET | Freq: Two times a day (BID) | ORAL | Status: DC
  Administered 2016-02-12 – 2016-02-13 (×2): 500 mg via ORAL
  Filled 2016-02-12 (×2): qty 1

## 2016-02-12 NOTE — Progress Notes (Signed)
Daily Progress Note   Patient Name: [name]       Date: 02/12/2016 DOB: 01/03/1921  Age: 82 y.o. MRN#: [id number] Attending Physician: Osvaldo ShipperGokul Krishnan, MD Primary Care Physician: Shirline Freesory Nafziger, NP Admit Date: 02/09/2016  Reason for Consultation/Follow-up: Establishing goals of care  Subjective:  patient is much more awake alert She tracks me in the room Appears in no distress  Speech therapy present in the room, appreciate their input   Length of Stay: 3  Current Medications: Scheduled Meds:  . acetaminophen  500 mg Oral BID  . ampicillin-sulbactam (UNASYN) IV  3 g Intravenous Q12H  . aspirin  162 mg Oral Daily  . atorvastatin  20 mg Oral q1800  . divalproex  250 mg Oral TID  . donepezil  10 mg Oral QHS  . guaiFENesin  30 mL Oral QID  . heparin  5,000 Units Subcutaneous Q8H  . levothyroxine  25 mcg Oral QAC breakfast  . magnesium sulfate 1 - 4 g bolus IVPB  2 g Intravenous Once  . risperiDONE  0.5 mg Oral BID  . sertraline  25 mg Oral Daily  . sodium chloride flush  3 mL Intravenous Q12H    Continuous Infusions: . dextrose 5 % and 0.9 % NaCl with KCl 40 mEq/L 75 mL/hr at 02/12/16 1004    PRN Meds: atropine, hydrALAZINE, ondansetron **OR** ondansetron (ZOFRAN) IV, RESOURCE THICKENUP CLEAR  Physical Exam         NAD Does not appear to have increased work of breathing S1 S2 Abdomen non distended No edema  Vital Signs: BP (!) 164/66 (BP Location: Right Arm)   Pulse 76   Temp 97.7 F (36.5 C) (Oral)   Resp 17   Ht 5\' 1"  (1.549 m)   Wt 52.3 kg (115 lb 4.8 oz)   SpO2 100%   BMI 21.79 kg/m  SpO2: SpO2: 100 % O2 Device: O2 Device: Not Delivered O2 Flow Rate:    Intake/output summary:   Intake/Output Summary (Last 24 hours) at 02/12/16 1054 Last data filed  at 02/12/16 0656  Gross per 24 hour  Intake          2266.25 ml  Output                0 ml  Net  2266.25 ml   LBM: Last BM Date: 02/10/16 Baseline Weight: Weight: 52.3 kg (115 lb 4.8 oz) Most recent weight: Weight: 52.3 kg (115 lb 4.8 oz)       Palliative Assessment/Data:    Flowsheet Rows   Flowsheet Row Most Recent Value  Intake Tab  Referral Department  Hospitalist  Unit at Time of Referral  Med/Surg Unit  Palliative Care Primary Diagnosis  Neurology  Date Notified  02/09/16  Palliative Care Type  Return patient Palliative Care  Reason for referral  Clarify Goals of Care, Counsel Regarding Hospice  Date of Admission  02/09/16  Date first seen by Palliative Care  02/10/16  # of days IP prior to Palliative referral  0  Clinical Assessment  Palliative Performance Scale Score  30%  Pain Max last 24 hours  3  Pain Min Last 24 hours  2  Dyspnea Max Last 24 Hours  3  Dyspnea Min Last 24 hours  2  Nausea Max Last 24 Hours  3  Nausea Min Last 24 Hours  2  Anxiety Max Last 24 Hours  2  Psychosocial & Spiritual Assessment  Palliative Care Outcomes  Patient/Family meeting held?  Yes  Who was at the meeting?  patient's son [name], daughter in law, grand daughter.   Palliative Care Outcomes  Clarified goals of care  Palliative Care follow-up planned  Yes, Facility      Patient Active Problem List   Diagnosis Date Noted  . Aspiration pneumonia (HCC)   . Hypokalemia   . Acute cystitis without hematuria   . Encephalopathy acute   . Stroke-like symptoms   . CKD (chronic kidney disease) stage 3, GFR 30-59 ml/min   . Alzheimer's dementia without behavioral disturbance   . Dementia without behavioral disturbance   . Troponin I above reference range 01/01/2016  . UTI (urinary tract infection) 01/01/2016  . General weakness 01/01/2016  . Abnormality of gait 02/07/2015  . Memory loss 07/17/2013  . Seizure disorder, focal motor (HCC) 03/26/2012  . Carotid stenosis  06/26/2011  . Depression 05/10/2010  . HYPERLIPIDEMIA-MIXED 11/05/2008  . Chronic diastolic CHF (congestive heart failure) (HCC) 09/17/2008  . UNSPECIFIED VENOUS INSUFFICIENCY 06/09/2008  . CORONARY ATHEROSCLEROSIS NATIVE CORONARY ARTERY 05/12/2008  . Hypothyroidism 12/05/2007  . CONSTIPATION, DRUG INDUCED 09/02/2007  . BREAST CYST 09/02/2007  . UNS ADVRS EFF UNS RX MEDICINAL&BIOLOGICAL SBSTNC 06/04/2007  . Essential hypertension 07/25/2006  . OSTEOPOROSIS 07/25/2006  . CEREBROVASCULAR ACCIDENT, HX OF 07/25/2006    Palliative Care Assessment & Plan   Patient Profile:  82 year old Caucasian female with a past medical history of congestive heart failure, likely diastolic, history of coronary artery disease, status post stent placement, advanced dementia, hypothyroidism, presented to the hospital with increasing somnolence and right-sided facial droop and extremity weakness. Patient resides at a skilled nursing facility. She was sent over to the hospital. There was concern for urinary tract infection as well. She was hospitalized for further management.  Assessment:  acute encephalopathy UTI Possible aspiration PNA Chronic d CHF  Recommendations/Plan:   Family meeting with son [name]: continue current mode of care, await culture results.   Long talk with son regarding dementia trajectory and goals of care discussions  Recommend SNF rehab with palliative care on d/c  Appreciate speech recs  Family asking for repeat Pt eval   Code Status:    Code Status Orders        Start     Ordered   02/09/16 1646  Do not attempt  resuscitation (DNR)  Continuous    Question Answer Comment  In the event of cardiac or respiratory ARREST Do not call a "code blue"   In the event of cardiac or respiratory ARREST Do not perform Intubation, CPR, defibrillation or ACLS   In the event of cardiac or respiratory ARREST Use medication by any route, position, wound care, and other measures to relive  pain and suffering. May use oxygen, suction and manual treatment of airway obstruction as needed for comfort.      02/09/16 1645    Code Status History    Date Active Date Inactive Code Status Order ID Comments User Context   02/09/2016  4:43 PM 02/09/2016  4:45 PM Full Code [id number]  Ozella Rocksavid J Merrell, MD ED   01/01/2016  2:03 PM 01/05/2016  5:53 PM DNR [id number]  Clydia LlanoMutaz Elmahi, MD Inpatient       Prognosis:   guarded, likely not more than weeks to months.   Discharge Planning:  Skilled Nursing Faci lity continue rehab with palliative care    Thank you for allowing the Palliative Medicine Team to assist in the care of this patient. Discussed with son and daughter in law, also discussed with Dr Rito EhrlichKrishnan.   Time In: 9.30 Time Out: 9.55 Total Time 25 Prolonged Time Billed  no       Greater than 50%  of this time was spent counseling and coordinating care related to the above assessment and plan.  Rosalin HawkingZeba Jeffre Enriques, MD [phone]  Please contact Palliative Medicine Team phone at [phone] for questions and concerns.

## 2016-02-12 NOTE — Progress Notes (Signed)
Patient has become increasingly lethargic since 2pm. Family aware. Patient can be woken up and responds to commands appropriately but immediately falls back to sleep. Son says he does not think it is safe for her to swallow pills at this time and emphasized to writer that she is a DNR and "if it's her time to go let her go." Dr. Rito EhrlichKrishnan notified via page. He stated that meds can be held if she is lethargic except for her augmentin due at 10pm. Will hold meds now and pass this information on to night shift Rn.  Lenord CarboAubrey  Kristy Schomburg

## 2016-02-12 NOTE — Progress Notes (Signed)
[facility] PROGRESS NOTE  Kathryn KidRuth M Robertson ZOX:[id number] DOB: 05/08/1920 DOA: 02/09/2016  PCP: Shirline Freesory Nafziger, NP  Brief History/Interval Summary: 82 year old Caucasian female with a past medical history of congestive heart failure, likely diastolic, history of coronary artery disease, status post stent placement, advanced dementia, hypothyroidism, presented from her skilled nursing facility to the hospital with increasing somnolence and right-sided facial droop and extremity weakness. She was sent over to the hospital. There was concern for urinary tract infection as well. She was hospitalized for further management.  Reason for Visit: Acute encephalopathy. UTI. Possible aspiration pneumonia  Consultants: Palliative medicine  Procedures: None  Antibiotics: Unasyn  Subjective/Interval History: Patient does not offer any complaints. She remains confused.   ROS: Unable to due to her mental status  Objective:  Vital Signs  Vitals:   02/11/16 2121 02/11/16 2231 02/11/16 2319 02/12/16 0520  BP: (!) 204/61 (!) 180/65 133/62 (!) 164/66  Pulse: 70   76  Resp:    17  Temp:    97.7 F (36.5 C)  TempSrc:    Oral  SpO2: 94%   100%  Weight:      Height:        Intake/Output Summary (Last 24 hours) at 02/12/16 0923 Last data filed at 02/12/16 0656  Gross per 24 hour  Intake          2266.25 ml  Output                0 ml  Net          2266.25 ml   Filed Weights   02/09/16 1700  Weight: 52.3 kg (115 lb 4.8 oz)    General appearance: Awake and alert. confused and distracted.[name]. Resp: Course breath sounds bilaterally. crackles at the bases. No wheezing. Cardio: regular rate and rhythm, S1, S2 normal, no murmur, click, rub or gallop GI: soft, non-tender; bowel sounds normal; no masses,  no organomegaly Neurologic: Awake, alert this morning, but distracted. No obvious deficits noted.  Lab Results:  Data Reviewed: I have personally reviewed following labs and imaging  studies  CBC:  Recent Labs Lab 02/06/16 02/09/16 1311 02/10/16 0348 02/11/16 0457  WBC 8.8 7.4 7.2 6.0  NEUTROABS  --  4.8  --   --   HGB 9.5* 10.4* 10.5* 11.1*  HCT 27* 30.5* 31.3* 33.0*  MCV  --  95.3 95.1 95.7  PLT 143* 142* 142* 137*    Basic Metabolic Panel:  Recent Labs Lab 02/06/16 02/09/16 1311 02/10/16 0348 02/11/16 0457 02/12/16 0443  NA 133* 139 139 140 139  K 4.1 2.9* 3.3* 3.3* 4.5  CL  --  98* 98* 103 109  CO2  --  31 33* 26 21*  GLUCOSE  --  75 105* 84 122*  BUN 28* 22* 17 13 17   CREATININE 1.3* 1.20* 1.20* 1.00 1.11*  CALCIUM  --  8.8* 8.7* 9.0 8.8*  MG  --   --   --  1.3*  --     GFR: Estimated Creatinine Clearance: 23.9 mL/min (by C-G formula based on SCr of 1.11 mg/dL (H)).  Liver Function Tests:  Recent Labs Lab 02/10/16 0348  AST 22  ALT 16  ALKPHOS 52  BILITOT 0.2*  PROT 5.5*  ALBUMIN 2.3*    Lipid Profile:  Recent Labs  02/10/16 0347  CHOL 110  HDL 53  LDLCALC 43  TRIG 70  CHOLHDL 2.1     Recent Results (from the past 240 hour(s))  Urine culture     Status: Abnormal (Preliminary result)   Collection Time: 02/09/16  2:20 PM  Result Value Ref Range Status   Specimen Description URINE, CATHETERIZED  Final   Special Requests NONE  Final   Culture >=100,000 COLONIES/mL GRAM NEGATIVE RODS (A)  Final   Report Status PENDING  Incomplete  MRSA PCR Screening     Status: None   Collection Time: 02/09/16 10:11 PM  Result Value Ref Range Status   MRSA by PCR NEGATIVE NEGATIVE Final    Comment:        The GeneXpert MRSA Assay (FDA approved for NASAL specimens only), is one component of a comprehensive MRSA colonization surveillance program. It is not intended to diagnose MRSA infection nor to guide or monitor treatment for MRSA infections.       Radiology Studies: No results found.   Medications:  Scheduled: . acetaminophen  500 mg Oral BID  . ampicillin-sulbactam (UNASYN) IV  3 g Intravenous Q12H  . aspirin   162 mg Oral Daily  . atorvastatin  20 mg Oral q1800  . divalproex  250 mg Oral TID  . donepezil  10 mg Oral QHS  . guaiFENesin  30 mL Oral QID  . heparin  5,000 Units Subcutaneous Q8H  . levothyroxine  25 mcg Oral QAC breakfast  . magnesium sulfate 1 - 4 g bolus IVPB  2 g Intravenous Once  . risperiDONE  0.5 mg Oral BID  . sertraline  25 mg Oral Daily  . sodium chloride flush  3 mL Intravenous Q12H   Continuous: . dextrose 5 % and 0.9 % NaCl with KCl 40 mEq/L 75 mL/hr at 02/11/16 2017   WUJ:WJXBJYNWPRN:atropine, hydrALAZINE, ondansetron **OR** ondansetron (ZOFRAN) IV, RESOURCE THICKENUP CLEAR  Assessment/Plan:  Active Problems:   UTI (urinary tract infection)   Hypokalemia   Aspiration pneumonia (HCC)    Acute encephalopathy There was some concern for right-sided facial droop and right-sided weakness. MRI brain was a poor study but did not show any acute stroke. Her mental status changes are most likely due to combination of infection, as well as metabolic derangements as well as underlying dementia. Mental status is improved. Continue to treat as below.   Cough/possible aspiration pneumonia Could be due to aspiration. Seen by speech therapy. On dysphagia 1 diet. Continue Unasyn due to concern for aspiration pneumonia.  Abnormal UA/UTI. Continue antibiotics. Gram-negative rods noted on urine culture. Await final identification and sensitivities for changing over to oral antibiotics.  Hypokalemia Improved.   Hypothyroidism Continue synthroid  Dementia/Depression Patient noted to be on Depakote, Risperdal and Aricept at home.  H/o coronary artery disease status post and placement in the past continue ASA, lipitor , and discharge.  Chronic diastolic congestive heart failure No evidence of decompensation. Noted to be bradycardic. Heart rate occasionally drops into the 30s and 40s. Beta blocker was discontinued.   History of essential hypertension  Monitor blood pressures  closely. Hydralazine as needed.  Goals of care. Discussed with Dr. Linna DarnerAnwar with palliative medicine. She has discussed with family. Plan is for discharge back to SNF with palliative medicine when she is ready for discharge.  DVT Prophylaxis: Subcutaneous heparin    Code Status: DO NOT RESUSCITATE  Family Communication: Discussed with the patient's son  Disposition Plan: Management as outlined above. Once final urine culture report is available, transition to oral antibiotics and discharge to skilled nursing facility with palliative medicine.    LOS: 3 days   [facility] CampusKRISHNAN,Kharter Sestak  [facility]  Pager [phone] 02/12/2016, 9:23 AM  If 7PM-7AM, please contact night-coverage at [email or web address], password [facility]

## 2016-02-13 DIAGNOSIS — I2510 Atherosclerotic heart disease of native coronary artery without angina pectoris: Secondary | ICD-10-CM | POA: Diagnosis not present

## 2016-02-13 DIAGNOSIS — D638 Anemia in other chronic diseases classified elsewhere: Secondary | ICD-10-CM | POA: Diagnosis not present

## 2016-02-13 DIAGNOSIS — F0390 Unspecified dementia without behavioral disturbance: Secondary | ICD-10-CM | POA: Diagnosis not present

## 2016-02-13 DIAGNOSIS — R278 Other lack of coordination: Secondary | ICD-10-CM | POA: Diagnosis not present

## 2016-02-13 DIAGNOSIS — R627 Adult failure to thrive: Secondary | ICD-10-CM | POA: Diagnosis not present

## 2016-02-13 DIAGNOSIS — B9620 Unspecified Escherichia coli [E. coli] as the cause of diseases classified elsewhere: Secondary | ICD-10-CM | POA: Diagnosis not present

## 2016-02-13 DIAGNOSIS — R531 Weakness: Secondary | ICD-10-CM | POA: Diagnosis not present

## 2016-02-13 DIAGNOSIS — D696 Thrombocytopenia, unspecified: Secondary | ICD-10-CM | POA: Diagnosis not present

## 2016-02-13 DIAGNOSIS — R404 Transient alteration of awareness: Secondary | ICD-10-CM | POA: Diagnosis not present

## 2016-02-13 DIAGNOSIS — F0280 Dementia in other diseases classified elsewhere without behavioral disturbance: Secondary | ICD-10-CM | POA: Diagnosis not present

## 2016-02-13 DIAGNOSIS — I5032 Chronic diastolic (congestive) heart failure: Secondary | ICD-10-CM | POA: Diagnosis not present

## 2016-02-13 DIAGNOSIS — N183 Chronic kidney disease, stage 3 (moderate): Secondary | ICD-10-CM | POA: Diagnosis not present

## 2016-02-13 DIAGNOSIS — E440 Moderate protein-calorie malnutrition: Secondary | ICD-10-CM | POA: Diagnosis not present

## 2016-02-13 DIAGNOSIS — N390 Urinary tract infection, site not specified: Secondary | ICD-10-CM | POA: Diagnosis not present

## 2016-02-13 DIAGNOSIS — F329 Major depressive disorder, single episode, unspecified: Secondary | ICD-10-CM | POA: Diagnosis not present

## 2016-02-13 DIAGNOSIS — R1312 Dysphagia, oropharyngeal phase: Secondary | ICD-10-CM | POA: Diagnosis not present

## 2016-02-13 DIAGNOSIS — R6520 Severe sepsis without septic shock: Secondary | ICD-10-CM | POA: Diagnosis not present

## 2016-02-13 DIAGNOSIS — M6281 Muscle weakness (generalized): Secondary | ICD-10-CM | POA: Diagnosis not present

## 2016-02-13 DIAGNOSIS — I10 Essential (primary) hypertension: Secondary | ICD-10-CM | POA: Diagnosis not present

## 2016-02-13 DIAGNOSIS — E039 Hypothyroidism, unspecified: Secondary | ICD-10-CM | POA: Diagnosis not present

## 2016-02-13 DIAGNOSIS — R41841 Cognitive communication deficit: Secondary | ICD-10-CM | POA: Diagnosis not present

## 2016-02-13 DIAGNOSIS — G309 Alzheimer's disease, unspecified: Secondary | ICD-10-CM | POA: Diagnosis not present

## 2016-02-13 DIAGNOSIS — Z9181 History of falling: Secondary | ICD-10-CM | POA: Diagnosis not present

## 2016-02-13 DIAGNOSIS — J690 Pneumonitis due to inhalation of food and vomit: Secondary | ICD-10-CM | POA: Diagnosis not present

## 2016-02-13 DIAGNOSIS — I69320 Aphasia following cerebral infarction: Secondary | ICD-10-CM | POA: Diagnosis not present

## 2016-02-13 DIAGNOSIS — G40109 Localization-related (focal) (partial) symptomatic epilepsy and epileptic syndromes with simple partial seizures, not intractable, without status epilepticus: Secondary | ICD-10-CM | POA: Diagnosis not present

## 2016-02-13 DIAGNOSIS — R488 Other symbolic dysfunctions: Secondary | ICD-10-CM | POA: Diagnosis not present

## 2016-02-13 LAB — BASIC METABOLIC PANEL
Anion gap: 10 (ref 5–15)
BUN: 19 mg/dL (ref 6–20)
CO2: 24 mmol/L (ref 22–32)
Calcium: 9 mg/dL (ref 8.9–10.3)
Chloride: 106 mmol/L (ref 101–111)
Creatinine, Ser: 1.04 mg/dL — ABNORMAL HIGH (ref 0.44–1.00)
GFR calc Af Amer: 52 mL/min — ABNORMAL LOW (ref >60)
GFR, EST NON AFRICAN AMERICAN: 45 mL/min — AB (ref >60)
Glucose, Bld: 90 mg/dL (ref 65–99)
POTASSIUM: 4.3 mmol/L (ref 3.5–5.1)
SODIUM: 140 mmol/L (ref 135–145)

## 2016-02-13 LAB — PROCALCITONIN: PROCALCITONIN: 0.24 ng/mL

## 2016-02-13 MED ORDER — VALSARTAN 40 MG PO TABS: 40.0000 mg | ORAL_TABLET | Freq: Two times a day (BID) | ORAL | 0 refills | Status: AC

## 2016-02-13 MED ORDER — DIVALPROEX SODIUM 125 MG PO CSDR: 250.0000 mg | DELAYED_RELEASE_CAPSULE | Freq: Every day | ORAL | Status: DC

## 2016-02-13 MED ORDER — RISPERIDONE 0.5 MG PO TABS: 0.5000 mg | ORAL_TABLET | Freq: Every day | ORAL | Status: DC

## 2016-02-13 MED ORDER — DIVALPROEX SODIUM 250 MG PO DR TAB
250.0000 mg | DELAYED_RELEASE_TABLET | Freq: Every day | ORAL | 1 refills | Status: DC
Start: 2016-02-13 — End: 2016-02-16

## 2016-02-13 MED ORDER — RISPERIDONE 0.5 MG PO TABS: 0.2500 mg | ORAL_TABLET | Freq: Every day | ORAL | 0 refills | Status: DC

## 2016-02-13 MED ORDER — AMOXICILLIN-POT CLAVULANATE 500-125 MG PO TABS: 1.0000 | ORAL_TABLET | Freq: Two times a day (BID) | ORAL | 0 refills | Status: AC

## 2016-02-13 NOTE — Progress Notes (Addendum)
[facility] Anticipated discharge date: 12/18 Family notified: Raiford Nobleick  Transportation by Sharin MonsPTAR- scheduled for 1:15pm Report #: [phone]  CSW signing off.  Burna SisJenna H. Raylee Strehl, LCSW Clinical Social Worker [phone]

## 2016-02-13 NOTE — Progress Notes (Signed)
Nsg Discharge Note  Admit Date:  02/09/2016 Discharge date: 02/13/2016   Dineen KidRuth M [facility]wo[facility]  AVS completed.  Copy for chart, and copy for patient signed, and dated. Patient/caregiver able to verbalize understanding.  Discharge Medication: Allergies as of 02/13/2016   No Known Allergies     Medication List    STOP taking these medications   carvedilol 3.125 MG tablet Commonly known as:  COREG   furosemide 20 MG tablet Commonly known as:  LASIX   Vitamin D (Ergocalciferol) 50000 units Caps capsule Commonly known as:  DRISDOL     TAKE these medications   albuterol (2.5 MG/3ML) 0.083% nebulizer solution Commonly known as:  PROVENTIL Take 2.5 mg by nebulization every 6 (six) hours.   amoxicillin-clavulanate 500-125 MG tablet Commonly known as:  AUGMENTIN Take 1 tablet (500 mg total) by mouth every 12 (twelve) hours.   aspirin EC 81 MG tablet Take 162 mg by mouth daily.   atorvastatin 20 MG tablet Commonly known as:  LIPITOR Take 1 tablet (20 mg total) by mouth daily.   divalproex 250 MG DR tablet Commonly known as:  DEPAKOTE Take 1 tablet (250 mg total) by mouth at bedtime. One tab at morning, 2 tabs at night What changed:  how much to take  how to take this  when to take this   donepezil 10 MG tablet Commonly known as:  ARICEPT Take 1 tablet (10 mg total) by mouth at bedtime.   levothyroxine 25 MCG tablet Commonly known as:  SYNTHROID, LEVOTHROID Take 1 tablet (25 mcg total) by mouth daily before breakfast.   nitroGLYCERIN 0.4 MG SL tablet Commonly known as:  NITROSTAT Place 0.4 mg under the tongue every 5 (five) minutes as needed for chest pain (3 DOSES MAX).   risperiDONE 0.5 MG tablet Commonly known as:  RISPERDAL Take 0.5 tablets (0.25 mg total) by mouth at bedtime. What changed:  how much to take  when to take this   sertraline 25 MG tablet Commonly known as:  ZOLOFT Take 25 mg by mouth daily.   TYLENOL  500 MG tablet Generic drug:  acetaminophen Take 500 mg by mouth 2 (two) times daily.   valsartan 40 MG tablet Commonly known as:  DIOVAN Take 1 tablet (40 mg total) by mouth 2 (two) times daily. What changed:  medication strength  how much to take       Discharge Assessment: Vitals:   02/13/16 0528 02/13/16 1318  BP: (!) 159/39 (!) 103/51  Pulse: (!) 46 82  Resp: (!) 8 16  Temp: 97.9 F (36.6 C) 98.2 F (36.8 C)   Skin clean, dry and intact without evidence of skin break down, no evidence of skin tears noted. IV catheter discontinued intact. Site without signs and symptoms of complications - no redness or edema noted at insertion site, patient denies c/o pain - only slight tenderness at site.  Dressing with slight pressure applied.  D/c Instructions-Education: Discharge instructions given to patient/family with verbalized understanding. D/c education completed with patient/family including follow up instructions, medication list, d/c activities limitations if indicated, with other d/c instructions as indicated by MD - patient able to verbalize understanding, all questions fully answered. Patient instructed to return to ED, call 911, or call MD for any changes in condition.  PTAR given AVS and prescriptions. Patient escorted via stretcher with PTAR.  Lenord CarboAubrey  Breahna Boylen, RN 02/13/2016 2:33 PM

## 2016-02-13 NOTE — Progress Notes (Signed)
Report given to Consuella LoseElaine, Charity fundraiserN at [facility].

## 2016-02-13 NOTE — Care Management Important Message (Signed)
Important Message  Patient Details  Name: [name] MRN: [id number] Date of Birth: 03/13/1920   Medicare Important Message Given:  Yes    Leelynn Whetsel Abena 02/13/2016, 10:58 AM

## 2016-02-13 NOTE — Discharge Summary (Signed)
[facility]  Physician Discharge Summary   Patient ID: [name] MRN: [id number] DOB/AGE: December 30, 1920 82 y.o.  Admit date: 02/09/2016 Discharge date: 02/13/2016  PCP: Shirline Frees, NP  DISCHARGE DIAGNOSES:  Active Problems:   UTI (urinary tract infection)   Hypokalemia   Aspiration pneumonia (HCC)   RECOMMENDATIONS FOR OUTPATIENT FOLLOW UP: 1. Palliative medicine to follow at skilled nursing facility. 2. Could consider suppressive antibiotics after current course of antibiotics to prevent future UTIs mainly for comfort purposes.  DISCHARGE CONDITION: fair  Diet recommendation: Dysphagia 1 diet with nectar thick liquids  Filed Weights   02/09/16 1700  Weight: 52.3 kg (115 lb 4.8 oz)    INITIAL HISTORY: 82 year old Caucasian female with a past medical history of congestive heart failure, likely diastolic, history of coronary artery disease, status post stent placement, advanced dementia, hypothyroidism, presented from her skilled nursing facility to the hospital with increasing somnolence and right-sided facial droop and extremity weakness. She was sent over to the hospital. There was concern for urinary tract infection as well. She was hospitalized for further management.  Consultations:  Palliative medicine  Procedures: None  HOSPITAL COURSE:   Acute encephalopathy Patient was hospitalized due to altered mental status. There was some concern for right-sided facial droop and right-sided weakness. MRI brain was a poor study but did not show any acute stroke. Her mental status changes are most likely due to combination of infection, as well as metabolic derangements and underlying dementia. Patient was started on antibiotics. Her mental status has improved, though she does have periods of somnolence.    Cough/possible aspiration pneumonia Could be due to aspiration. Seen by speech therapy. On dysphagia 1 diet. Patient was initially placed on Unasyn. She has  improved. Change to oral antibiotics. Continue for 5 more days.   UTI with E coli Sensitivities reviewed. Augmentin should cover. Continue with antibiotics for now. Son reports that patient develops frequent UTIs. He tells me this morning that if she developed another urinary tract infection. He may choose not to treat it as this has been an ongoing issue for her. Alternatively, a suppressive antibiotic can be considered for mainly comfort purposes.   Hypokalemia Improved.   Hypothyroidism Continue synthroid  Dementia/Depression Patient noted to be on Depakote, Risperdal and Aricept at home. These medications may also be causing increased somnolence. Dose has been reduced.  H/o coronary artery disease status post and placement in the past Continue home medications. Hold beta blocker due to bradycardia.  Chronic diastolic congestive heart failure No evidence of decompensation. Noted to be bradycardic. Heart rate occasionally drops into the 30s and 40s. Beta blocker was discontinued.   History of essential hypertension  Blood pressure is noted to be elevated. Resume ARB, but at a lower dose.  Likely chronic kidney disease stage III Renal function at baseline.  Goals of care. Discussed with Dr. Linna Darner with palliative medicine. She has discussed with family. Plan is for discharge back to SNF with palliative medicine when she is ready for discharge. If patient worsens future plan will be to avoid hospitalization and perhaps transition towards comfort.  Overall, stable. Okay for discharge to skilled nursing facility with palliative care.    PERTINENT LABS:  The results of significant diagnostics from this hospitalization (including imaging, microbiology, ancillary and laboratory) are listed below for reference.    Microbiology: Recent Results (from the past 240 hour(s))  Urine culture     Status: Abnormal   Collection Time: 02/09/16  2:20 PM  Result  Value Ref Range Status    Specimen Description URINE, CATHETERIZED  Final   Special Requests NONE  Final   Culture >=100,000 COLONIES/mL ESCHERICHIA COLI (A)  Final   Report Status 02/12/2016 FINAL  Final   Organism ID, Bacteria ESCHERICHIA COLI (A)  Final      Susceptibility   Escherichia coli - MIC*    AMPICILLIN 4 SENSITIVE Sensitive     CEFAZOLIN <=4 SENSITIVE Sensitive     CEFTRIAXONE <=1 SENSITIVE Sensitive     CIPROFLOXACIN <=0.25 SENSITIVE Sensitive     GENTAMICIN <=1 SENSITIVE Sensitive     IMIPENEM <=0.25 SENSITIVE Sensitive     NITROFURANTOIN <=16 SENSITIVE Sensitive     TRIMETH/SULFA <=20 SENSITIVE Sensitive     AMPICILLIN/SULBACTAM <=2 SENSITIVE Sensitive     PIP/TAZO <=4 SENSITIVE Sensitive     Extended ESBL NEGATIVE Sensitive     * >=100,000 COLONIES/mL ESCHERICHIA COLI  MRSA PCR Screening     Status: None   Collection Time: 02/09/16 10:11 PM  Result Value Ref Range Status   MRSA by PCR NEGATIVE NEGATIVE Final    Comment:        The GeneXpert MRSA Assay (FDA approved for NASAL specimens only), is one component of a comprehensive MRSA colonization surveillance program. It is not intended to diagnose MRSA infection nor to guide or monitor treatment for MRSA infections.      Labs: Basic Metabolic Panel:  Recent Labs Lab 02/09/16 1311 02/10/16 0348 02/11/16 0457 02/12/16 0443 02/13/16 0328  NA 139 139 140 139 140  K 2.9* 3.3* 3.3* 4.5 4.3  CL 98* 98* 103 109 106  CO2 31 33* 26 21* 24  GLUCOSE 75 105* 84 122* 90  BUN 22* 17 13 17 19   CREATININE 1.20* 1.20* 1.00 1.11* 1.04*  CALCIUM 8.8* 8.7* 9.0 8.8* 9.0  MG  --   --  1.3*  --   --    Liver Function Tests:  Recent Labs Lab 02/10/16 0348  AST 22  ALT 16  ALKPHOS 52  BILITOT 0.2*  PROT 5.5*  ALBUMIN 2.3*   CBC:  Recent Labs Lab 02/09/16 1311 02/10/16 0348 02/11/16 0457  WBC 7.4 7.2 6.0  NEUTROABS 4.8  --   --   HGB 10.4* 10.5* 11.1*  HCT 30.5* 31.3* 33.0*  MCV 95.3 95.1 95.7  PLT 142* 142* 137*      IMAGING STUDIES Ct Head Wo Contrast  Result Date: 02/09/2016 CLINICAL DATA:  Altered mental status. EXAM: CT HEAD WITHOUT CONTRAST TECHNIQUE: Contiguous axial images were obtained from the base of the skull through the vertex without intravenous contrast. COMPARISON:  CT scan of January 01, 2016. FINDINGS: Brain: Mild chronic ischemic white matter disease is noted. Mild diffuse cortical atrophy is noted. No mass effect or midline shift is noted. Ventricular size is within normal limits. There is no evidence of mass lesion, hemorrhage or acute infarction. Vascular: Atherosclerosis of carotid siphons is noted. Skull: Normal. Negative for fracture or focal lesion. Sinuses/Orbits: Bilateral ethmoid and maxillary sinusitis is noted. Other: None. IMPRESSION: Mild chronic ischemic white matter disease. Mild diffuse cortical atrophy. No acute intracranial abnormality seen. Electronically Signed   By: Lupita RaiderJames  Green Jr, M.D.   On: 02/09/2016 13:59   Mr Brain Wo Contrast  Result Date: 02/09/2016 CLINICAL DATA:  Increasing somnolence, RIGHT facial droop and extremity weakness for 3 days. Symptoms worsening for 1 day. History of dementia, frequent falls, hypertension, stroke. EXAM: MRI HEAD WITHOUT CONTRAST TECHNIQUE: Multiplanar, multiecho pulse sequences  of the brain and surrounding structures were obtained without intravenous contrast. COMPARISON:  CT HEAD February 09, 2016 at 1330 hours an MRI head June 25, 2008 FINDINGS: Multiple sequences are mildly motion degraded. BRAIN: No reduced diffusion to suggest acute ischemia. No susceptibility artifact to suggest hemorrhage. Moderate to severe ventriculomegaly on the basis of global parenchymal brain volume loss. Patchy to confluent supratentorial white matter FLAIR T2 hyperintensities. Small LEFT temporal parietal cortical T2 hyperintensity. No suspicious parenchymal signal, masses or mass effect. No abnormal extra-axial fluid collections. No extra-axial masses  though, contrast enhanced sequences would be more sensitive. VASCULAR: Normal major intracranial vascular flow voids present at skull base. SKULL AND UPPER CERVICAL SPINE: No abnormal sellar expansion. No suspicious calvarial bone marrow signal. Craniocervical junction maintained. SINUSES/ORBITS: Paranasal sinus mucosal thickening. Maxillary sinus atresia compatible chronic sinusitis. Trace mastoid effusions. The included ocular globes and orbital contents are non-suspicious. OTHER: None. IMPRESSION: No acute intracranial process on this mildly motion degraded examination. Progressed moderate to severe global parenchymal brain volume loss and moderate to severe chronic small vessel ischemic disease. Old small LEFT temporal parietal/MCA territory infarcts. Electronically Signed   By: Awilda Metro M.D.   On: 02/09/2016 19:45   Dg Chest Port 1 View  Result Date: 02/09/2016 CLINICAL DATA:  Mental status change EXAM: PORTABLE CHEST 1 VIEW COMPARISON:  01/01/2016 FINDINGS: Mild bibasilar atelectasis. Negative for heart failure or edema. No pleural effusion. Right peritracheal density most likely vascular ectasia. Follow-up with two-view chest x-ray. Atherosclerotic aortic arch.  Heart size upper normal. IMPRESSION: Mild bibasilar atelectasis.  Negative for heart failure. Right peritracheal soft tissue density likely vascular ectasia. Followup two-view chest x-ray suggested. Electronically Signed   By: Marlan Palau M.D.   On: 02/09/2016 13:19    DISCHARGE EXAMINATION: Vitals:   02/12/16 1522 02/12/16 2117 02/13/16 0345 02/13/16 0528  BP: (!) 155/47 (!) 186/60 (!) 176/76 (!) 159/39  Pulse: 69 86 78 (!) 46  Resp: 16 17  (!) 8  Temp: 97.6 F (36.4 C) 98.1 F (36.7 C)  97.9 F (36.6 C)  TempSrc: Oral Oral    SpO2: 98% 99%  96%  Weight:      Height:       General appearance: alert, distracted. Pleasantly confused. Resp: clear to auscultation bilaterally Cardio: regular rate and rhythm, S1, S2  normal, no murmur, click, rub or gallop GI: soft, non-tender; bowel sounds normal; no masses,  no organomegaly Extremities: extremities normal, atraumatic, no cyanosis or edema  DISPOSITION: SNF  Discharge Instructions    Discharge instructions    Complete by:  As directed    See discharge summary for instructions.  You were cared for by a hospitalist during your hospital stay. If you have any questions about your discharge medications or the care you received while you were in the hospital after you are discharged, you can call the unit and asked to speak with the hospitalist on call if the hospitalist that took care of you is not available. Once you are discharged, your primary care physician will handle any further medical issues. Please note that NO REFILLS for any discharge medications will be authorized once you are discharged, as it is imperative that you return to your primary care physician (or establish a relationship with a primary care physician if you do not have one) for your aftercare needs so that they can reassess your need for medications and monitor your lab values. If you do not have a primary care physician, you can  call [phone] for a physician referral.   Increase activity slowly    Complete by:  As directed       ALLERGIES: No Known Allergies   Current Discharge Medication List    START taking these medications   Details  amoxicillin-clavulanate (AUGMENTIN) 500-125 MG tablet Take 1 tablet (500 mg total) by mouth every 12 (twelve) hours. Qty: 10 tablet, Refills: 0      CONTINUE these medications which have CHANGED   Details  divalproex (DEPAKOTE) 250 MG DR tablet Take 1 tablet (250 mg total) by mouth at bedtime. One tab at morning, 2 tabs at night Qty: 30 tablet, Refills: 1    risperiDONE (RISPERDAL) 0.5 MG tablet Take 0.5 tablets (0.25 mg total) by mouth at bedtime. Qty: 10 tablet, Refills: 0    valsartan (DIOVAN) 40 MG tablet Take 1 tablet (40 mg total) by  mouth 2 (two) times daily. Qty: 60 tablet, Refills: 0      CONTINUE these medications which have NOT CHANGED   Details  acetaminophen (TYLENOL) 500 MG tablet Take 500 mg by mouth 2 (two) times daily.     albuterol (PROVENTIL) (2.5 MG/3ML) 0.083% nebulizer solution Take 2.5 mg by nebulization every 6 (six) hours.    aspirin EC 81 MG tablet Take 162 mg by mouth daily.    atorvastatin (LIPITOR) 20 MG tablet Take 1 tablet (20 mg total) by mouth daily. Qty: 90 tablet, Refills: 1    donepezil (ARICEPT) 10 MG tablet Take 1 tablet (10 mg total) by mouth at bedtime. Qty: 30 tablet, Refills: 11    levothyroxine (SYNTHROID, LEVOTHROID) 25 MCG tablet Take 1 tablet (25 mcg total) by mouth daily before breakfast. Qty: 30 tablet, Refills: 0    nitroGLYCERIN (NITROSTAT) 0.4 MG SL tablet Place 0.4 mg under the tongue every 5 (five) minutes as needed for chest pain (3 DOSES MAX).    sertraline (ZOLOFT) 25 MG tablet Take 25 mg by mouth daily.      STOP taking these medications     carvedilol (COREG) 3.125 MG tablet      furosemide (LASIX) 20 MG tablet      Vitamin D, Ergocalciferol, (DRISDOL) 50000 units CAPS capsule           TOTAL DISCHARGE TIME: 35 minutes  Friends HospitalKRISHNAN,[place]  [facility] Pager [phone]  02/13/2016, 11:32 AM

## 2016-02-13 NOTE — Care Management Note (Signed)
Case Management Note  Patient Details  Name: [name] MRN: [id number] Date of Birth: 10/05/1920  Subjective/Objective:                 [facility]. Palliative consult during admission. CSW following.   Action/Plan:  Anticipate DC to SNF ([facility] with Palliative. CM spoke with MD and CSW during progression about including MOST form with DC to SNF.  Expected Discharge Date:                  Expected Discharge Plan:  Skilled Nursing Facility  In-House Referral:  Clinical Social Work  Discharge planning Services  CM Consult  Post Acute Care Choice:    Choice offered to:     DME Arranged:    DME Agency:     HH Arranged:    HH Agency:     Status of Service:  Completed, signed off  If discussed at [employer] Meetings, dates discussed:    Additional Comments:  Lawerance SabalDebbie Izekiel Flegel, RN 02/13/2016, 11:53 AM

## 2016-02-13 NOTE — Discharge Instructions (Signed)
Palliative Care ° °Palliative care is the care of your body, mind, and spirit. Palliative care services are offered to people dealing with serious and life-threatening illnesses, often in the hospital or a long-term care setting. Palliative care requires a team of people who will help to ensure: °· Pain and symptoms are controlled. °· Family support. °· Spiritual support. °· Emotional and social support. °· Comfort. °Palliative care is a way to bring comfort and peace of mind to a person and his or her family. It can also have a positive impact on the course of illness. °Who can receive palliative care services? °Palliative care is offered to children and adults when they are seriously ill and may not be responding well to treatment options. The person may be undergoing active treatment, such as chemotherapy, or may have stopped active treatment. Some people may have just been diagnosed with advanced disease or life-limiting illness. A health care provider will usually recommend palliative care services when more support would be helpful. °What types of services are included in palliative care? °Palliative care includes a team of health care providers and supporters who come together to help a person facing serious and life-threatening illness. The person's existing doctors are included in the care team, and supporters are added. Family members and friends may also receive palliative care services to cope with stress and other concerns. Services are different for each person and are based on the person's needs and preferences. °The following people make up a palliative care team: °· The person receiving care and his or her family. °· Physicians, including primary health care providers and specialists. °· Nurses. °· A psychosocial worker. °Other people on the palliative care team may include: °· A pain specialist and sometimes a hospice specialist. °· A financial or insurance consultant. °· Community resources, such  as school and spiritual organizations. °· Religious leaders. °· A care coordinator or case manager. °· A bereavement coordinator. °The team will talk with the person and his or her family about: °· The role of the pain specialist and the hospice specialist if this applies. °· The person's active physical symptoms, such as pain, nausea, vomiting, and shortness of breath. °· Stress, depression, and anxiety symptoms. °· Function and mobility issues and how to stay as active as possible. °· Treatment options and how they may affect life. °· Spiritual wishes, such as rituals and prayer. °· Legacy and memory making activities. °· Life and death as a normal process. °· Advance directives or living wills, health care proxies, and end-of-life care. °· Any other concerns or issues. °The palliative care team will make it okay to talk about difficult issues and topics. Preferences and sensitive spiritual and emotional concerns are of high importance. °Palliative care and hospice are somewhat different °Palliative care and hospice care have similar goals of managing symptoms, promoting comfort, and maintaining dignity. However, hospice care is an option for people during the last 6 months of life expectancy. The palliative care team will coordinate the many support services provided during any phase of serious illness. °This information is not intended to replace advice given to you by your health care provider. Make sure you discuss any questions you have with your health care provider. °Document Released: 02/17/2013 Document Revised: 07/21/2015 Document Reviewed: 12/30/2012 °Elsevier Interactive Patient Education © 2017 Elsevier Inc. ° °

## 2016-02-13 NOTE — Progress Notes (Signed)
Physical Therapy Treatment Patient Details Name: Kathryn KidRuth M Lince MRN: [id number] DOB: 07/07/1920 Today's Date: 02/13/2016    History of Present Illness [name] is a 82 y.o. female with medical history significant of CHF, CAD/NSTEMI status post stent placement, advanced dementia, frequent falls, hypertension, hypothyroidism, stroke, transient SVT presenting with increasing somnolence and right sided facial droop and extremity weakness.    PT Comments    Noted that pt's family asking for a PT re-eval to assess mobility again. During session, pt minimally verbal, and mostly nodded her head in response to therapist's questions. Pt continues to require +2 max assist for basic transfers. At this time, feel that pt continues to be appropriate for continued therapy at the SNF level for as long as she is willing to participate. Will continue to follow acutely and progress as able per POC.   Follow Up Recommendations        Equipment Recommendations  None recommended by PT    Recommendations for Other Services       Precautions / Restrictions Precautions Precautions: Fall Restrictions Weight Bearing Restrictions: No    Mobility  Bed Mobility Overal bed mobility: Needs Assistance Bed Mobility: Supine to Sit     Supine to sit: Max assist     General bed mobility comments: MAX A with use of bed pad to get hips over and to get trunk upright. Increased time for pt to achieve upright posture.  Transfers Overall transfer level: Needs assistance Equipment used: 2 person hand held assist Transfers: Stand Pivot Transfers;Sit to/from Stand Sit to Stand: Max assist;+2 physical assistance Stand pivot transfers: Max assist;+2 physical assistance       General transfer comment: Pt with short steps with SPT requiring MAX of 2 due to fatigue and weakness  Ambulation/Gait                 Stairs            Wheelchair Mobility    Modified Rankin (Stroke Patients  Only) Modified Rankin (Stroke Patients Only) Pre-Morbid Rankin Score: Moderately severe disability Modified Rankin: Severe disability     Balance Overall balance assessment: Needs assistance Sitting-balance support: Feet supported;Single extremity supported Sitting balance-Leahy Scale: Poor Sitting balance - Comments: Pt sat at EOB with min/guard and progressed to needing more A as she fatigued and became more flexed and bent over.   Standing balance support: Bilateral upper extremity supported Standing balance-Leahy Scale: Zero                      Cognition Arousal/Alertness: Awake/alert Behavior During Therapy: Flat affect Overall Cognitive Status: History of cognitive impairments - at baseline                 General Comments: able to follow 1 step commands in functional context     Exercises      General Comments        Pertinent Vitals/Pain Pain Assessment: No/denies pain    Home Living                      Prior Function            PT Goals (current goals can now be found in the care plan section) Acute Rehab PT Goals Patient Stated Goal: pt wanting to get up to chair PT Goal Formulation: Patient unable to participate in goal setting Time For Goal Achievement: 02/17/16 Potential to Achieve Goals: Fair Progress towards PT  goals: Progressing toward goals    Frequency    Min 2X/week      PT Plan Current plan remains appropriate    Co-evaluation PT/OT/SLP Co-Evaluation/Treatment: Yes           End of Session Equipment Utilized During Treatment: Gait belt Activity Tolerance: Patient limited by fatigue Patient left: in chair;with call bell/phone within reach;with chair alarm set     Time: 0953-1010 PT Time Calculation (min) (ACUTE ONLY): 17 min  Charges:  $Gait Training: 8-22 mins                    G Codes:      Marylynn PearsonLaura D Celicia Minahan 02/13/2016, 12:34 PM   Conni SlipperLaura Catriona Dillenbeck, PT, DPT Acute Rehabilitation Services Pager:  [phone]

## 2016-02-14 ENCOUNTER — Telehealth: Payer: Self-pay | Admitting: Neurology

## 2016-02-14 NOTE — Telephone Encounter (Signed)
PT SON RICHARD CX 2/6 APPT. STATES PT GIVEN ONLY A FEW MONTHS TO LIVE. [facility] & AFTER 3 WEEKS SHE WILL RETURN TO [name].

## 2016-02-14 NOTE — Telephone Encounter (Signed)
I talked with her son [name], she had UTI, was sent to ED, non responsive, MRI brain showed generalized atrophy, periventricular white matter disease.   She is hospice care.

## 2016-02-15 ENCOUNTER — Ambulatory Visit: Payer: Medicare Other | Admitting: Nurse Practitioner

## 2016-02-16 ENCOUNTER — Encounter: Payer: Self-pay | Admitting: Internal Medicine

## 2016-02-16 ENCOUNTER — Non-Acute Institutional Stay (SKILLED_NURSING_FACILITY): Payer: Medicare Other | Admitting: Internal Medicine

## 2016-02-16 DIAGNOSIS — E440 Moderate protein-calorie malnutrition: Secondary | ICD-10-CM | POA: Diagnosis not present

## 2016-02-16 DIAGNOSIS — B9620 Unspecified Escherichia coli [E. coli] as the cause of diseases classified elsewhere: Secondary | ICD-10-CM

## 2016-02-16 DIAGNOSIS — R531 Weakness: Secondary | ICD-10-CM

## 2016-02-16 DIAGNOSIS — N390 Urinary tract infection, site not specified: Secondary | ICD-10-CM

## 2016-02-16 DIAGNOSIS — E039 Hypothyroidism, unspecified: Secondary | ICD-10-CM

## 2016-02-16 DIAGNOSIS — F03C0 Unspecified dementia, severe, without behavioral disturbance, psychotic disturbance, mood disturbance, and anxiety: Secondary | ICD-10-CM

## 2016-02-16 DIAGNOSIS — J690 Pneumonitis due to inhalation of food and vomit: Secondary | ICD-10-CM

## 2016-02-16 DIAGNOSIS — N183 Chronic kidney disease, stage 3 unspecified: Secondary | ICD-10-CM

## 2016-02-16 DIAGNOSIS — F329 Major depressive disorder, single episode, unspecified: Secondary | ICD-10-CM

## 2016-02-16 DIAGNOSIS — I5032 Chronic diastolic (congestive) heart failure: Secondary | ICD-10-CM | POA: Diagnosis not present

## 2016-02-16 DIAGNOSIS — F32A Depression, unspecified: Secondary | ICD-10-CM

## 2016-02-16 DIAGNOSIS — I2510 Atherosclerotic heart disease of native coronary artery without angina pectoris: Secondary | ICD-10-CM

## 2016-02-16 DIAGNOSIS — D638 Anemia in other chronic diseases classified elsewhere: Secondary | ICD-10-CM

## 2016-02-16 DIAGNOSIS — F0390 Unspecified dementia without behavioral disturbance: Secondary | ICD-10-CM

## 2016-02-16 DIAGNOSIS — D696 Thrombocytopenia, unspecified: Secondary | ICD-10-CM

## 2016-02-16 NOTE — Progress Notes (Signed)
[facility]  PCP: Shirline Freesory Nafziger, NP   Code Status: DNR  Goals of care: Advanced Directive information Advanced Directives 02/16/2016  Does Patient Have a Medical Advance Directive? Yes  Type of Advance Directive Out of facility DNR (pink MOST or yellow form)  Does patient want to make changes to medical advance directive? No - Patient declined  Copy of Healthcare Power of Attorney in Chart? -  Would patient like information on creating a medical advance directive? -       Extended Emergency Contact Information Primary Emergency Contact: Righi,Richard Address: [address]          [place] Home Phone: [phone] Relation: Son   No Known Allergies  Chief Complaint  Patient presents with  . Readmit To SNF    Readmission Visit      HPI:  Patient is a 82 y.o. female seen today for short term rehabilitation post hospital re-admission from 02/09/2016-02/13/2016 with worsening weakness and right facial droop. She had acute encephalopathy. She was diagnosed with aspiration pneumonia and Escherichia coli UTI. She was started on antibiotics. She was seen by speech therapist in the hospital and started on dysphagia 1 diet with nectar thick liquids. She is seen in her room today. She has medical history of congestive heart failure, coronary artery disease status post stent placement, advanced dementia, hypothyroidism among others and she was undergoing rehabilitation at this facility prior to this readmission.  Review of Systems:  Constitutional: Negative for fever, chills, diaphoresis. Energy level is slowly coming back.   HENT: Negative for headache, congestion, nasal discharge. Eyes: Negative for double vision and discharge. Wears glasses.   Respiratory: Negative for shortness of breath and wheezing.   positive for cough. Cardiovascular: Negative for chest pain, palpitations, leg swelling.  Gastrointestinal: Negative for  heartburn, nausea, vomiting, abdominal pain. Last bowel movement was this morning.   Genitourinary: Negative for dysuria.  Musculoskeletal: Negative for back pain, fall in the facility.  Skin: Negative for itching, rash.  Neurological: Negative for dizziness. Psychiatric/Behavioral: Negative for depression. has memory loss.    Past Medical History:  Diagnosis Date  . CHF (congestive heart failure) (HCC)    Diastolic CHF. Echo (3/10) EF 45-50% with periapical akinesis. Mild LVH. Mild MR. Pseudonormal diastolic function. Echo (4/10): EF 65% with mild focal basal septal hypertrophy. Pseudonormal diastolic function. Normal wall motion. Moderate biatrial enlargement. PASP 61 mmHg. Echo (4/11): EF 55%, inferobasal hypokinesis, mild MR, PA systolic pressure 44 mmHg.  [name]. Coronary artery disease    NSTEMI 3/10. LHC showed 80% mLAD, 90% mCFX. She had PCI with Endeavor 2.5 x 12 to LAD and Endeavor 3.0 x 12 to CFX  . Dementia   . Expressive aphasia syndrome    More likely atypical migraine than TIA. Carotid dopplers (8/09) with 40-60% LICA stenosis.  . Frequent falls   . Horner's syndrome    left  . Hypertension   . Hypothyroidism   . Lacunar stroke (HCC)    on CT  . SVT (supraventricular tachycardia) (HCC)    transient   No past surgical history on file. Social History:   reports that she quit smoking about 65 years ago. Her smoking use included Cigarettes. She has never used smokeless tobacco. She reports that she does not drink alcohol or use drugs.  Family History  Problem Relation Age of Onset  . Hypertension Mother   . COPD Father   . Heart attack Neg Hx  Medications: Allergies as of 02/16/2016   No Known Allergies     Medication List       Accurate as of 02/16/16 12:53 PM. Always use your most recent med list.          albuterol (2.5 MG/3ML) 0.083% nebulizer solution Commonly known as:  PROVENTIL Take 2.5 mg by nebulization every 6 (six) hours.     amoxicillin-clavulanate 500-125 MG tablet Commonly known as:  AUGMENTIN Take 1 tablet (500 mg total) by mouth every 12 (twelve) hours.   aspirin EC 81 MG tablet Take 81 mg by mouth daily.   atorvastatin 20 MG tablet Commonly known as:  LIPITOR Take 1 tablet (20 mg total) by mouth daily.   donepezil 10 MG tablet Commonly known as:  ARICEPT Take 1 tablet (10 mg total) by mouth at bedtime.   levothyroxine 25 MCG tablet Commonly known as:  SYNTHROID, LEVOTHROID Take 1 tablet (25 mcg total) by mouth daily before breakfast.   nitroGLYCERIN 0.4 MG SL tablet Commonly known as:  NITROSTAT Place 0.4 mg under the tongue every 5 (five) minutes as needed for chest pain (3 DOSES MAX).   risperiDONE 0.5 MG tablet Commonly known as:  RISPERDAL Take 0.5 tablets (0.25 mg total) by mouth at bedtime.   sertraline 25 MG tablet Commonly known as:  ZOLOFT Take 25 mg by mouth daily.   TYLENOL 500 MG tablet Generic drug:  acetaminophen Take 500 mg by mouth 2 (two) times daily.   valsartan 40 MG tablet Commonly known as:  DIOVAN Take 1 tablet (40 mg total) by mouth 2 (two) times daily.       Immunizations: Immunization History  Administered Date(s) Administered  . Influenza Split 11/26/2011  . Influenza Whole 11/27/1998, 12/30/2006, 12/05/2007, 01/17/2009, 01/10/2010  . Influenza, High Dose Seasonal PF 03/22/2015  . Influenza,inj,Quad PF,36+ Mos 01/28/2013, 01/02/2016  . PPD Test 01/05/2016  . Pneumococcal Conjugate-13 03/22/2015  . Pneumococcal Polysaccharide-23 02/26/2005  . Td 02/26/2005     Physical Exam: Vitals:   02/16/16 1239  BP: (!) 121/47  Pulse: 72  Resp: 18  Temp: 97.7 F (36.5 C)  TempSrc: Oral  SpO2: 96%  Weight: 109 lb 9.6 oz (49.7 kg)  Height: 4\' 10"  (1.473 m)   Body mass index is 22.91 kg/m.  General- elderly female, frail and thin built, in no acute distress Head- normocephalic, atraumatic Throat- moist mucus membrane, missing teeth Eyes- no  pallor, no icterus, no discharge, normal conjunctiva, normal sclera Neck- no cervical lymphadenopathy Cardiovascular- normal s1,s2, + murmur, trace leg edema Respiratory- bilateral decreased air entry at the bases, no wheezing, no crackles, no rhonchi, no use of accessory muscles. Abdomen- bowel sounds present, soft, non tender, no guarding or rigidity, no CVA tenderness Musculoskeletal- able to move all 4 extremities, generalized weakness, on wheelchair, kyphosis present, arthritis changes to her fingers Neurological- alert and oriented to self only Skin- warm and dry, easy bruising Psychiatry- calm with normal affect this visit, poor insight     Labs reviewed: Basic Metabolic Panel:  Recent Labs  40/98/11 0457 02/12/16 0443 02/13/16 0328  NA 140 139 140  K 3.3* 4.5 4.3  CL 103 109 106  CO2 26 21* 24  GLUCOSE 84 122* 90  BUN 13 17 19   CREATININE 1.00 1.11* 1.04*  CALCIUM 9.0 8.8* 9.0  MG 1.3*  --   --    Liver Function Tests:  Recent Labs  09/12/15 1019 01/01/16 1026 02/10/16 0348  AST 17 58* 22  ALT  13 36 16  ALKPHOS 63 56 52  BILITOT 0.5 0.9 0.2*  PROT 6.5 6.7 5.5*  ALBUMIN 3.6 3.2* 2.3*   No results for input(s): LIPASE, AMYLASE in the last 8760 hours. No results for input(s): AMMONIA in the last 8760 hours. CBC:  Recent Labs  10/12/15 1558 01/01/16 1026  02/09/16 1311 02/10/16 0348 02/11/16 0457  WBC 7.7 11.5*  < > 7.4 7.2 6.0  NEUTROABS 4.2 8.4*  --  4.8  --   --   HGB 10.4* 12.2  < > 10.4* 10.5* 11.1*  HCT 30.1* 36.2  < > 30.5* 31.3* 33.0*  MCV 91.0 93.3  < > 95.3 95.1 95.7  PLT 223.0 164  < > 142* 142* 137*  < > = values in this interval not displayed. Cardiac Enzymes:  Recent Labs  01/01/16 1026 01/01/16 1421 01/01/16 1959  CKTOTAL 658*  --   --   TROPONINI 0.15* 0.12* 0.10*   BNP: Invalid input(s): POCBNP CBG:  Recent Labs  09/12/15 1244  GLUCAP 111*    Radiological Exams: Ct Head Wo Contrast  Result Date:  02/09/2016 CLINICAL DATA:  Altered mental status. EXAM: CT HEAD WITHOUT CONTRAST TECHNIQUE: Contiguous axial images were obtained from the base of the skull through the vertex without intravenous contrast. COMPARISON:  CT scan of January 01, 2016. FINDINGS: Brain: Mild chronic ischemic white matter disease is noted. Mild diffuse cortical atrophy is noted. No mass effect or midline shift is noted. Ventricular size is within normal limits. There is no evidence of mass lesion, hemorrhage or acute infarction. Vascular: Atherosclerosis of carotid siphons is noted. Skull: Normal. Negative for fracture or focal lesion. Sinuses/Orbits: Bilateral ethmoid and maxillary sinusitis is noted. Other: None. IMPRESSION: Mild chronic ischemic white matter disease. Mild diffuse cortical atrophy. No acute intracranial abnormality seen. Electronically Signed   By: Lupita Raider, M.D.   On: 02/09/2016 13:59   Mr Brain Wo Contrast  Result Date: 02/09/2016 CLINICAL DATA:  Increasing somnolence, RIGHT facial droop and extremity weakness for 3 days. Symptoms worsening for 1 day. History of dementia, frequent falls, hypertension, stroke. EXAM: MRI HEAD WITHOUT CONTRAST TECHNIQUE: Multiplanar, multiecho pulse sequences of the brain and surrounding structures were obtained without intravenous contrast. COMPARISON:  CT HEAD February 09, 2016 at 1330 hours an MRI head June 25, 2008 FINDINGS: Multiple sequences are mildly motion degraded. BRAIN: No reduced diffusion to suggest acute ischemia. No susceptibility artifact to suggest hemorrhage. Moderate to severe ventriculomegaly on the basis of global parenchymal brain volume loss. Patchy to confluent supratentorial white matter FLAIR T2 hyperintensities. Small LEFT temporal parietal cortical T2 hyperintensity. No suspicious parenchymal signal, masses or mass effect. No abnormal extra-axial fluid collections. No extra-axial masses though, contrast enhanced sequences would be more sensitive.  VASCULAR: Normal major intracranial vascular flow voids present at skull base. SKULL AND UPPER CERVICAL SPINE: No abnormal sellar expansion. No suspicious calvarial bone marrow signal. Craniocervical junction maintained. SINUSES/ORBITS: Paranasal sinus mucosal thickening. Maxillary sinus atresia compatible chronic sinusitis. Trace mastoid effusions. The included ocular globes and orbital contents are non-suspicious. OTHER: None. IMPRESSION: No acute intracranial process on this mildly motion degraded examination. Progressed moderate to severe global parenchymal brain volume loss and moderate to severe chronic small vessel ischemic disease. Old small LEFT temporal parietal/MCA territory infarcts. Electronically Signed   By: Awilda Metro M.D.   On: 02/09/2016 19:45   Dg Chest Port 1 View  Result Date: 02/09/2016 CLINICAL DATA:  Mental status change EXAM: PORTABLE CHEST 1 VIEW  COMPARISON:  01/01/2016 FINDINGS: Mild bibasilar atelectasis. Negative for heart failure or edema. No pleural effusion. Right peritracheal density most likely vascular ectasia. Follow-up with two-view chest x-ray. Atherosclerotic aortic arch.  Heart size upper normal. IMPRESSION: Mild bibasilar atelectasis.  Negative for heart failure. Right peritracheal soft tissue density likely vascular ectasia. Followup two-view chest x-ray suggested. Electronically Signed   By: Marlan Palauharles  Clark M.D.   On: 02/09/2016 13:19    Assessment/Plan  Generalized weakness From physical deconditioning with infection. Will have her work with physical therapy and occupational therapy team. Fall precautions in place. Per discussion in the hospital the palliative care team, goal is for comfort care and to avoid further hospitalization. Will get palliative care consult at the facility to review goals of care further.  Aspiration pneumonia Aspiration precautions to be taken. Continue dysphagia diet for now and to be followed by speech therapist. Continue and  complete course of Augmentin on 02/18/2016. To provide assistance with feeding.  Escherichia coli UTI Continue and complete Augmentin every 12 hours on 02/18/2016. Hydration to be maintained. Perineal hygiene to be maintained.  Protein calorie malnutrition RD to follow. Monitor weight. Encourage oral intake. Decline Anticipated with advanced dementia.  Advanced dementia with behavioral disturbance Provide supportive care. Fall precautions and pressure ulcer prophylaxis to be taking. Continue Aricept along with a Depakote and Risperdal.  Depression Monitor clinically. Continue her sertraline with Depakote and Risperdal.  Hypothyroidism Continue Synthroid  Chronic diastolic congestive heart failure Appears euvolemic. Beta blocker was discontinued in the hospital because of her bradycardia. Continue valsartan 40 mg twice a day. Monitor clinically. Lasix held in the hospital because of her impaired kidney function. Monitor BMP.  Anemia of chronic disease Monitor CBC  Chronic kidney disease Monitor BMP  Thrombocytopenia Has easy bruising. No active plate reported. Monitor platelet count.  Coronary artery disease Has history of stent placement. Continue aspirin 162 mg daily. Continue atorvastatin and valsartan. Of beta blocker with bradycardia. Monitor blood pressure and heart rate. Chest pain-free at present. Continue when necessary nitroglycerin.    Goals of care: short term rehabilitation   Labs/tests ordered: CBC, BMP 02/21/16   Family/ staff Communication: reviewed care plan with patient and nursing supervisor    Oneal GroutMAHIMA Birda Didonato, MD Internal Medicine [facility] [address] [place], [place] [address] Cell Phone (Monday-Friday 8 am - 5 pm): [phone] On Call: [phone] and follow prompts after 5 pm and on weekends Office Phone: [phone] Office Fax: [phone]

## 2016-02-21 LAB — BASIC METABOLIC PANEL
BUN: 14 mg/dL (ref 4–21)
Creatinine: 0.9 mg/dL (ref 0.5–1.1)
Glucose: 135 mg/dL
Potassium: 4.2 mmol/L (ref 3.4–5.3)
SODIUM: 138 mmol/L (ref 137–147)

## 2016-02-21 LAB — CBC AND DIFFERENTIAL
HEMATOCRIT: 42 % (ref 36–46)
HEMOGLOBIN: 13.2 g/dL (ref 12.0–16.0)
NEUTROS ABS: 4 /uL
PLATELETS: 203 10*3/uL (ref 150–399)
WBC: 6.6 10^3/mL

## 2016-02-22 DIAGNOSIS — R627 Adult failure to thrive: Secondary | ICD-10-CM | POA: Diagnosis not present

## 2016-02-29 DIAGNOSIS — R404 Transient alteration of awareness: Secondary | ICD-10-CM | POA: Diagnosis not present

## 2016-03-12 ENCOUNTER — Non-Acute Institutional Stay (SKILLED_NURSING_FACILITY): Payer: Medicare Other | Admitting: Family

## 2016-03-12 DIAGNOSIS — I10 Essential (primary) hypertension: Secondary | ICD-10-CM

## 2016-03-12 DIAGNOSIS — F32A Depression, unspecified: Secondary | ICD-10-CM

## 2016-03-12 DIAGNOSIS — F0280 Dementia in other diseases classified elsewhere without behavioral disturbance: Secondary | ICD-10-CM

## 2016-03-12 DIAGNOSIS — G309 Alzheimer's disease, unspecified: Secondary | ICD-10-CM | POA: Diagnosis not present

## 2016-03-12 DIAGNOSIS — G40109 Localization-related (focal) (partial) symptomatic epilepsy and epileptic syndromes with simple partial seizures, not intractable, without status epilepticus: Secondary | ICD-10-CM | POA: Diagnosis not present

## 2016-03-12 DIAGNOSIS — F329 Major depressive disorder, single episode, unspecified: Secondary | ICD-10-CM | POA: Diagnosis not present

## 2016-03-12 NOTE — Progress Notes (Signed)
Location:  [facility] Room Number: 1007  Place of Service:  SNF (31) Provider:Millissa Deese FNP-C   Shirline Freesory Nafziger, NP  Patient Care Team: Shirline Freesory Nafziger, NP as PCP - General (Family Medicine)  Extended Emergency Contact Information Primary Emergency Contact: Kouba,Richard Address: [address]          [place], [place] Home Phone: [phone] Relation: Son  Code Status: DNR  Goals of care: Advanced Directive information Advanced Directives 02/16/2016  Does Patient Have a Medical Advance Directive? Yes  Type of Advance Directive Out of facility DNR (pink MOST or yellow form)  Does patient want to make changes to medical advance directive? No - Patient declined  Copy of Healthcare Power of Attorney in Chart? -  Would patient like information on creating a medical advance directive? -     Chief Complaint  Patient presents with  . Medical Management of Chronic Issues    HPI:  Pt is a 83 y.o. female [facility] for medical management of chronic diseases.She has a medical history of Alzheimer's dementia without behavioral disturbance, HTN, seizures, CHF, CAD, hypothyroidism, CKD stage 3 among other conditions. She is seen in her room today.Her recent UTI symptoms resolved with treatment.Patient's Son Avon GullyHCPOA requested no more treatment for with antibiotics for UTI infections. He request pyridium 100 mg daily x 5 days.Facility Nurse states patient doing much better. She has been getting OOB daily.    Past Medical History:  Diagnosis Date  . CHF (congestive heart failure) (HCC)    Diastolic CHF. Echo (3/10) EF 45-50% with periapical akinesis. Mild LVH. Mild MR. Pseudonormal diastolic function. Echo (4/10): EF 65% with mild focal basal septal hypertrophy. Pseudonormal diastolic function. Normal wall motion. Moderate biatrial enlargement. PASP 61 mmHg. Echo (4/11): EF 55%, inferobasal hypokinesis,  mild MR, PA systolic pressure 44 mmHg.  [name]. Coronary artery disease    NSTEMI 3/10. LHC showed 80% mLAD, 90% mCFX. She had PCI with Endeavor 2.5 x 12 to LAD and Endeavor 3.0 x 12 to CFX  . Dementia   . Expressive aphasia syndrome    More likely atypical migraine than TIA. Carotid dopplers (8/09) with 40-60% LICA stenosis.  . Frequent falls   . Horner's syndrome    left  . Hypertension   . Hypothyroidism   . Lacunar stroke (HCC)    on CT  . SVT (supraventricular tachycardia) (HCC)    transient   No past surgical history on file.  No Known Allergies  Allergies as of 03/12/2016   No Known Allergies     Medication List       Accurate as of 03/12/16  5:39 PM. Always use your most recent med list.          aspirin EC 81 MG tablet Take 81 mg by mouth daily.   divalproex 250 MG DR tablet Commonly known as:  DEPAKOTE Take 250 mg by mouth daily. Two tablets by mouth at bedtime.   levothyroxine 25 MCG tablet Commonly known as:  SYNTHROID, LEVOTHROID Take 1 tablet (25 mcg total) by mouth daily before breakfast.   LORazepam 0.5 MG tablet Commonly known as:  ATIVAN Take 0.25 mg by mouth at bedtime as needed for anxiety.   nitroGLYCERIN 0.4 MG SL tablet Commonly known as:  NITROSTAT Place 0.4 mg under the tongue every 5 (five) minutes as needed for chest pain (3 DOSES MAX).   sertraline 25 MG tablet Commonly  known as:  ZOLOFT Take 25 mg by mouth daily.   TYLENOL 500 MG tablet Generic drug:  acetaminophen Take 500 mg by mouth 2 (two) times daily.   valsartan 40 MG tablet Commonly known as:  DIOVAN Take 1 tablet (40 mg total) by mouth 2 (two) times daily.       Review of Systems  Unable to perform ROS: Dementia    Immunization History  Administered Date(s) Administered  . Influenza Split 11/26/2011  . Influenza Whole 11/27/1998, 12/30/2006, 12/05/2007, 01/17/2009, 01/10/2010  . Influenza, High Dose Seasonal PF 03/22/2015  . Influenza,inj,Quad PF,36+ Mos  01/28/2013, 01/02/2016  . PPD Test 01/05/2016  . Pneumococcal Conjugate-13 03/22/2015  . Pneumococcal Polysaccharide-23 02/26/2005  . Td 02/26/2005   Pertinent  Health Maintenance Due  Topic Date Due  . DEXA SCAN  07/28/1987  . INFLUENZA VACCINE  Completed  . PNA vac Low Risk Adult  Completed   Fall Risk  03/22/2015 02/12/2014 01/28/2013 07/25/2012 03/26/2012  Falls in the past year? No Yes No Yes Yes  Number falls in past yr: - 2 or more - - -  Risk Factor Category  - High Fall Risk - - -  Risk for fall due to : - - Impaired balance/gait Impaired balance/gait;Impaired mobility Impaired balance/gait;Impaired mobility    Vitals:   03/12/16 1200  BP: (!) 153/65  Pulse: 70  Resp: 18  Temp: (!) 96.9 F (36.1 C)  SpO2: 95%  Weight: 109 lb 9.6 oz (49.7 kg)  Height: 4\' 10"  (1.473 m)   Body mass index is 22.91 kg/m. Physical Exam  Constitutional: She appears well-developed and well-nourished. No distress.  Pleasantly confused elderly in no acute distress   HENT:  Head: Normocephalic.  Mouth/Throat: Oropharynx is clear and moist. No oropharyngeal exudate.  Eyes: Conjunctivae and EOM are normal. Pupils are equal, round, and reactive to light. Right eye exhibits no discharge. Left eye exhibits no discharge. No scleral icterus.  Neck: Neck supple. No JVD present. No thyromegaly present.  Cardiovascular: Normal rate, regular rhythm, normal heart sounds and intact distal pulses.  Exam reveals no gallop and no friction rub.   No murmur heard. Pulmonary/Chest: Effort normal and breath sounds normal. No respiratory distress. She has no wheezes.  Abdominal: Soft. Bowel sounds are normal. She exhibits no distension. There is no tenderness. There is no rebound and no guarding.  Genitourinary:  Genitourinary Comments: Incontinent   Musculoskeletal: She exhibits no deformity.  Moves x 4 extremities.unsteady gait  Lymphadenopathy:    She has no cervical adenopathy.  Neurological: She is alert.   Pleasantly confused at baseline  Skin: Skin is warm and dry. No rash noted. No erythema. No pallor.  Psychiatric: She has a normal mood and affect.    Labs reviewed:  Recent Labs  02/11/16 0457 02/12/16 0443 02/13/16 0328 02/21/16  NA 140 139 140 138  K 3.3* 4.5 4.3 4.2  CL 103 109 106  --   CO2 26 21* 24  --   GLUCOSE 84 122* 90  --   BUN 13 17 19 14   CREATININE 1.00 1.11* 1.04* 0.9  CALCIUM 9.0 8.8* 9.0  --   MG 1.3*  --   --   --     Recent Labs  09/12/15 1019 01/01/16 1026 02/10/16 0348  AST 17 58* 22  ALT 13 36 16  ALKPHOS 63 56 52  BILITOT 0.5 0.9 0.2*  PROT 6.5 6.7 5.5*  ALBUMIN 3.6 3.2* 2.3*    Recent Labs  01/01/16 1026  02/09/16 1311 02/10/16 0348 02/11/16 0457 02/21/16  WBC 11.5*  < > 7.4 7.2 6.0 6.6  NEUTROABS 8.4*  --  4.8  --   --  4  HGB 12.2  < > 10.4* 10.5* 11.1* 13.2  HCT 36.2  < > 30.5* 31.3* 33.0* 42  MCV 93.3  < > 95.3 95.1 95.7  --   PLT 164  < > 142* 142* 137* 203  < > = values in this interval not displayed. Lab Results  Component Value Date   TSH 4.261 01/01/2016   Lab Results  Component Value Date   HGBA1C 5.0 02/10/2016   Lab Results  Component Value Date   CHOL 110 02/10/2016   HDL 53 02/10/2016   LDLCALC 43 02/10/2016   LDLDIRECT 36.0 03/22/2009   TRIG 70 02/10/2016   CHOLHDL 2.1 02/10/2016   Assessment/Plan 1. Essential hypertension B/p stable. Continue on Valsartan. Continue to monitor BMP.   2. Seizure disorder, focal motor (HCC) No recent episodes. Continue on Depakote and Ativan. Safety precautions.   3. Alzheimer's dementia without behavioral disturbance, unspecified timing of dementia onset No new behavioral issues. Continue on Ativan and Depakote. Aricept recently discontinued.   4. Depression, unspecified depression type Stable. Continue on Sertraline. Monitor for mood changes.    Family/ staff Communication: Reviewed plan of care with facility Nurse supervisor.   Labs/tests ordered: None

## 2016-03-15 ENCOUNTER — Other Ambulatory Visit: Payer: Self-pay | Admitting: Cardiology

## 2016-03-16 DIAGNOSIS — R627 Adult failure to thrive: Secondary | ICD-10-CM | POA: Diagnosis not present

## 2016-03-22 DIAGNOSIS — R627 Adult failure to thrive: Secondary | ICD-10-CM | POA: Diagnosis not present

## 2016-03-27 DIAGNOSIS — I509 Heart failure, unspecified: Secondary | ICD-10-CM | POA: Diagnosis not present

## 2016-03-27 DIAGNOSIS — F0150 Vascular dementia without behavioral disturbance: Secondary | ICD-10-CM | POA: Diagnosis not present

## 2016-03-27 DIAGNOSIS — E039 Hypothyroidism, unspecified: Secondary | ICD-10-CM | POA: Diagnosis not present

## 2016-03-27 DIAGNOSIS — I25810 Atherosclerosis of coronary artery bypass graft(s) without angina pectoris: Secondary | ICD-10-CM | POA: Diagnosis not present

## 2016-03-27 DIAGNOSIS — R54 Age-related physical debility: Secondary | ICD-10-CM | POA: Diagnosis not present

## 2016-03-27 DIAGNOSIS — I672 Cerebral atherosclerosis: Secondary | ICD-10-CM | POA: Diagnosis not present

## 2016-03-27 DIAGNOSIS — I471 Supraventricular tachycardia: Secondary | ICD-10-CM | POA: Diagnosis not present

## 2016-03-27 DIAGNOSIS — Z8673 Personal history of transient ischemic attack (TIA), and cerebral infarction without residual deficits: Secondary | ICD-10-CM | POA: Diagnosis not present

## 2016-03-27 DIAGNOSIS — N390 Urinary tract infection, site not specified: Secondary | ICD-10-CM | POA: Diagnosis not present

## 2016-03-27 DIAGNOSIS — F339 Major depressive disorder, recurrent, unspecified: Secondary | ICD-10-CM | POA: Diagnosis not present

## 2016-03-27 DIAGNOSIS — I679 Cerebrovascular disease, unspecified: Secondary | ICD-10-CM | POA: Diagnosis not present

## 2016-03-27 DIAGNOSIS — I10 Essential (primary) hypertension: Secondary | ICD-10-CM | POA: Diagnosis not present

## 2016-03-27 DIAGNOSIS — R569 Unspecified convulsions: Secondary | ICD-10-CM | POA: Diagnosis not present

## 2016-03-27 DIAGNOSIS — R4701 Aphasia: Secondary | ICD-10-CM | POA: Diagnosis not present

## 2016-03-27 DIAGNOSIS — R1310 Dysphagia, unspecified: Secondary | ICD-10-CM | POA: Diagnosis not present

## 2016-03-28 DIAGNOSIS — I679 Cerebrovascular disease, unspecified: Secondary | ICD-10-CM | POA: Diagnosis not present

## 2016-03-28 DIAGNOSIS — I25810 Atherosclerosis of coronary artery bypass graft(s) without angina pectoris: Secondary | ICD-10-CM | POA: Diagnosis not present

## 2016-03-28 DIAGNOSIS — F0150 Vascular dementia without behavioral disturbance: Secondary | ICD-10-CM | POA: Diagnosis not present

## 2016-03-28 DIAGNOSIS — Z8673 Personal history of transient ischemic attack (TIA), and cerebral infarction without residual deficits: Secondary | ICD-10-CM | POA: Diagnosis not present

## 2016-03-28 DIAGNOSIS — I509 Heart failure, unspecified: Secondary | ICD-10-CM | POA: Diagnosis not present

## 2016-03-28 DIAGNOSIS — I672 Cerebral atherosclerosis: Secondary | ICD-10-CM | POA: Diagnosis not present

## 2016-03-29 DIAGNOSIS — R569 Unspecified convulsions: Secondary | ICD-10-CM | POA: Diagnosis not present

## 2016-03-29 DIAGNOSIS — F0150 Vascular dementia without behavioral disturbance: Secondary | ICD-10-CM | POA: Diagnosis not present

## 2016-03-29 DIAGNOSIS — N390 Urinary tract infection, site not specified: Secondary | ICD-10-CM | POA: Diagnosis not present

## 2016-03-29 DIAGNOSIS — I471 Supraventricular tachycardia: Secondary | ICD-10-CM | POA: Diagnosis not present

## 2016-03-29 DIAGNOSIS — E039 Hypothyroidism, unspecified: Secondary | ICD-10-CM | POA: Diagnosis not present

## 2016-03-29 DIAGNOSIS — I10 Essential (primary) hypertension: Secondary | ICD-10-CM | POA: Diagnosis not present

## 2016-03-29 DIAGNOSIS — Z8673 Personal history of transient ischemic attack (TIA), and cerebral infarction without residual deficits: Secondary | ICD-10-CM | POA: Diagnosis not present

## 2016-03-29 DIAGNOSIS — I509 Heart failure, unspecified: Secondary | ICD-10-CM | POA: Diagnosis not present

## 2016-03-29 DIAGNOSIS — F339 Major depressive disorder, recurrent, unspecified: Secondary | ICD-10-CM | POA: Diagnosis not present

## 2016-03-29 DIAGNOSIS — I672 Cerebral atherosclerosis: Secondary | ICD-10-CM | POA: Diagnosis not present

## 2016-03-29 DIAGNOSIS — I679 Cerebrovascular disease, unspecified: Secondary | ICD-10-CM | POA: Diagnosis not present

## 2016-03-29 DIAGNOSIS — R1310 Dysphagia, unspecified: Secondary | ICD-10-CM | POA: Diagnosis not present

## 2016-03-29 DIAGNOSIS — R54 Age-related physical debility: Secondary | ICD-10-CM | POA: Diagnosis not present

## 2016-03-29 DIAGNOSIS — I25810 Atherosclerosis of coronary artery bypass graft(s) without angina pectoris: Secondary | ICD-10-CM | POA: Diagnosis not present

## 2016-03-29 DIAGNOSIS — R4701 Aphasia: Secondary | ICD-10-CM | POA: Diagnosis not present

## 2016-03-30 ENCOUNTER — Encounter: Payer: Self-pay | Admitting: Family

## 2016-03-30 ENCOUNTER — Non-Acute Institutional Stay (SKILLED_NURSING_FACILITY): Payer: Medicare Other | Admitting: Family

## 2016-03-30 DIAGNOSIS — R05 Cough: Secondary | ICD-10-CM

## 2016-03-30 DIAGNOSIS — R451 Restlessness and agitation: Secondary | ICD-10-CM | POA: Diagnosis not present

## 2016-03-30 DIAGNOSIS — R059 Cough, unspecified: Secondary | ICD-10-CM

## 2016-03-30 DIAGNOSIS — I672 Cerebral atherosclerosis: Secondary | ICD-10-CM | POA: Diagnosis not present

## 2016-03-30 DIAGNOSIS — I25810 Atherosclerosis of coronary artery bypass graft(s) without angina pectoris: Secondary | ICD-10-CM | POA: Diagnosis not present

## 2016-03-30 DIAGNOSIS — Z8673 Personal history of transient ischemic attack (TIA), and cerebral infarction without residual deficits: Secondary | ICD-10-CM | POA: Diagnosis not present

## 2016-03-30 DIAGNOSIS — F0150 Vascular dementia without behavioral disturbance: Secondary | ICD-10-CM | POA: Diagnosis not present

## 2016-03-30 DIAGNOSIS — I509 Heart failure, unspecified: Secondary | ICD-10-CM | POA: Diagnosis not present

## 2016-03-30 DIAGNOSIS — I679 Cerebrovascular disease, unspecified: Secondary | ICD-10-CM | POA: Diagnosis not present

## 2016-03-30 MED ORDER — LORAZEPAM 0.5 MG PO TABS: 0.2500 mg | ORAL_TABLET | Freq: Every day | ORAL | 1 refills | Status: AC | PRN

## 2016-03-30 NOTE — Progress Notes (Signed)
Location:  [facility] Room Number: 1007-P Place of Service:  SNF [phone]) Provider: Emylia Latella FNP-C   Shirline Frees, NP  Patient Care Team: Shirline Frees, NP as PCP - General (Family Medicine)  Extended Emergency Contact Information Primary Emergency Contact: [name] Address: [address]          [place] Home Phone: [phone] Relation: Son  Code Status:  DNR  Goals of care: Advanced Directive information Advanced Directives 02/16/2016  Does Patient Have a Medical Advance Directive? Yes  Type of Advance Directive Out of facility DNR (pink MOST or yellow form)  Does patient want to make changes to medical advance directive? No - Patient declined  Copy of Healthcare Power of Attorney in Chart? -  Would patient like information on creating a medical advance directive? -     Chief Complaint  Patient presents with  . Acute Visit    Agitation and cough    HPI:  Pt is a 83 y.o. female seen today for an acute visit for evaluation of change in condition. She has a significant medical history of HTN, CHF, Seizure disorder, Dementia among other conditions.She is seen in her room today per patient's son request.Patient's son reports patient has had a non-productive cough for several days. Also request patient's ativan to be restarted due to increased agitation. He would like ativan started in very low dose " something to just keep her calm". Patient alert but unable to provide HPI and ROS due to dementia. She continues to be follow up by hospice service.   Past Medical History:  Diagnosis Date  . CHF (congestive heart failure) (HCC)    Diastolic CHF. Echo (3/10) EF 45-50% with periapical akinesis. Mild LVH. Mild MR. Pseudonormal diastolic function. Echo (4/10): EF 65% with mild focal basal septal hypertrophy. Pseudonormal diastolic function. Normal wall motion. Moderate biatrial enlargement. PASP 61 mmHg. Echo  (4/11): EF 55%, inferobasal hypokinesis, mild MR, PA systolic pressure 44 mmHg.  [name] Coronary artery disease    NSTEMI 3/10. LHC showed 80% mLAD, 90% mCFX. She had PCI with Endeavor 2.5 x 12 to LAD and Endeavor 3.0 x 12 to CFX  . Dementia   . Expressive aphasia syndrome    More likely atypical migraine than TIA. Carotid dopplers (8/09) with 40-60% LICA stenosis.  . Frequent falls   . Horner's syndrome    left  . Hypertension   . Hypothyroidism   . Lacunar stroke (HCC)    on CT  . SVT (supraventricular tachycardia) (HCC)    transient   No past surgical history on file.  No Known Allergies  Allergies as of 03/30/2016   No Known Allergies     Medication List       Accurate as of 03/30/16  1:39 PM. Always use your most recent med list.          aspirin EC 81 MG tablet Take 81 mg by mouth daily.   divalproex 250 MG DR tablet Commonly known as:  DEPAKOTE Take 250 mg by mouth daily. Two tablets by mouth at bedtime.   levothyroxine 25 MCG tablet Commonly known as:  SYNTHROID, LEVOTHROID Take 1 tablet (25 mcg total) by mouth daily before breakfast.   Melatonin 3 MG Tabs Take 1 tablet by mouth at bedtime.   nitroGLYCERIN 0.4 MG SL tablet Commonly known as:  NITROSTAT Place 0.4 mg under the tongue every 5 (five) minutes as needed for chest pain (3  DOSES MAX).   sertraline 25 MG tablet Commonly known as:  ZOLOFT Take 25 mg by mouth daily.   TYLENOL 500 MG tablet Generic drug:  acetaminophen Take 500 mg by mouth 2 (two) times daily.   valsartan 40 MG tablet Commonly known as:  DIOVAN Take 1 tablet (40 mg total) by mouth 2 (two) times daily.       Review of Systems  Unable to perform ROS: Dementia    Immunization History  Administered Date(s) Administered  . Influenza Split 11/26/2011  . Influenza Whole 11/27/1998, 12/30/2006, 12/05/2007, 01/17/2009, 01/10/2010  . Influenza, High Dose Seasonal PF 03/22/2015  . Influenza,inj,Quad PF,36+ Mos 01/28/2013, 01/02/2016    . PPD Test 01/05/2016  . Pneumococcal Conjugate-13 03/22/2015  . Pneumococcal Polysaccharide-23 02/26/2005  . Td 02/26/2005   Pertinent  Health Maintenance Due  Topic Date Due  . DEXA SCAN  07/28/1987  . INFLUENZA VACCINE  Completed  . PNA vac Low Risk Adult  Completed   Fall Risk  03/22/2015 02/12/2014 01/28/2013 07/25/2012 03/26/2012  Falls in the past year? No Yes No Yes Yes  Number falls in past yr: - 2 or more - - -  Risk Factor Category  - High Fall Risk - - -  Risk for fall due to : - - Impaired balance/gait Impaired balance/gait;Impaired mobility Impaired balance/gait;Impaired mobility      Vitals:   03/30/16 1123  BP: (!) 150/83  Pulse: 78  Resp: 20  Temp: 98.4 F (36.9 C)  TempSrc: Oral  SpO2: 98%  Weight: 108 lb 9.6 oz (49.3 kg)  Height: 4\' 10"  (1.473 m)   Body mass index is 22.7 kg/m. Physical Exam  Constitutional: She appears well-developed and well-nourished. No distress.  Alert but non-verbal  HENT:  Head: Normocephalic.  Mouth/Throat: Oropharynx is clear and moist. No oropharyngeal exudate.  Eyes: Conjunctivae and EOM are normal. Pupils are equal, round, and reactive to light. Right eye exhibits no discharge. Left eye exhibits no discharge. No scleral icterus.  Neck: Neck supple. No JVD present. No thyromegaly present.  Cardiovascular: Normal rate, regular rhythm, normal heart sounds and intact distal pulses.  Exam reveals no gallop and no friction rub.   No murmur heard. Pulmonary/Chest: Effort normal. No respiratory distress. She has no wheezes.  Diminished bilaterally though unable to follow directions to deep breath.   Abdominal: Soft. Bowel sounds are normal. She exhibits no distension. There is no tenderness. There is no rebound and no guarding.  Musculoskeletal: She exhibits no deformity.  Moves x 4 extremities. Unsteady gait.  Lymphadenopathy:    She has no cervical adenopathy.  Neurological: She is alert.  Skin: Skin is warm and dry. No rash  noted. No erythema. No pallor.  # 1. Right hand skin tear drsg dry, clean and intact without any signs of infections.   # 2 Peas size wound; wound bed red with yellow skin tissue noted. Surrounding skin without any signs of infections.   Psychiatric: She has a normal mood and affect.    Labs reviewed:  Recent Labs  02/11/16 0457 02/12/16 0443 02/13/16 0328 02/21/16  NA 140 139 140 138  K 3.3* 4.5 4.3 4.2  CL 103 109 106  --   CO2 26 21* 24  --   GLUCOSE 84 122* 90  --   BUN 13 17 19 14   CREATININE 1.00 1.11* 1.04* 0.9  CALCIUM 9.0 8.8* 9.0  --   MG 1.3*  --   --   --  Recent Labs  09/12/15 1019 01/01/16 1026 02/10/16 0348  AST 17 58* 22  ALT 13 36 16  ALKPHOS 63 56 52  BILITOT 0.5 0.9 0.2*  PROT 6.5 6.7 5.5*  ALBUMIN 3.6 3.2* 2.3*    Recent Labs  01/01/16 1026  02/09/16 1311 02/10/16 0348 02/11/16 0457 02/21/16  WBC 11.5*  < > 7.4 7.2 6.0 6.6  NEUTROABS 8.4*  --  4.8  --   --  4  HGB 12.2  < > 10.4* 10.5* 11.1* 13.2  HCT 36.2  < > 30.5* 31.3* 33.0* 42  MCV 93.3  < > 95.3 95.1 95.7  --   PLT 164  < > 142* 142* 137* 203  < > = values in this interval not displayed. Lab Results  Component Value Date   TSH 4.261 01/01/2016   Lab Results  Component Value Date   HGBA1C 5.0 02/10/2016   Lab Results  Component Value Date   CHOL 110 02/10/2016   HDL 53 02/10/2016   LDLCALC 43 02/10/2016   LDLDIRECT 36.0 03/22/2009   TRIG 70 02/10/2016   CHOLHDL 2.1 02/10/2016   Assessment/Plan Cough  Afebrile. Patient's son reports non-productive cough.lung field diminished to auscultation. Will obtain portable CXR r/o PNA. Continue cough syrup as needed.  Agitation Son request small dose of ativan to be restarted due to anxiety. Ativan 0.25 mg tablet one by mouth daily as needed. Continue to monitor.    Family/ staff Communication: Reviewed plan of care with patient's son and  facility Nurse supervisor.  Labs/tests ordered:   portable CXR r/o PNA.

## 2016-03-31 DIAGNOSIS — Z8673 Personal history of transient ischemic attack (TIA), and cerebral infarction without residual deficits: Secondary | ICD-10-CM | POA: Diagnosis not present

## 2016-03-31 DIAGNOSIS — I679 Cerebrovascular disease, unspecified: Secondary | ICD-10-CM | POA: Diagnosis not present

## 2016-03-31 DIAGNOSIS — I25810 Atherosclerosis of coronary artery bypass graft(s) without angina pectoris: Secondary | ICD-10-CM | POA: Diagnosis not present

## 2016-03-31 DIAGNOSIS — I509 Heart failure, unspecified: Secondary | ICD-10-CM | POA: Diagnosis not present

## 2016-03-31 DIAGNOSIS — F0150 Vascular dementia without behavioral disturbance: Secondary | ICD-10-CM | POA: Diagnosis not present

## 2016-03-31 DIAGNOSIS — I672 Cerebral atherosclerosis: Secondary | ICD-10-CM | POA: Diagnosis not present

## 2016-04-02 DIAGNOSIS — I509 Heart failure, unspecified: Secondary | ICD-10-CM | POA: Diagnosis not present

## 2016-04-02 DIAGNOSIS — I679 Cerebrovascular disease, unspecified: Secondary | ICD-10-CM | POA: Diagnosis not present

## 2016-04-02 DIAGNOSIS — F0150 Vascular dementia without behavioral disturbance: Secondary | ICD-10-CM | POA: Diagnosis not present

## 2016-04-02 DIAGNOSIS — Z8673 Personal history of transient ischemic attack (TIA), and cerebral infarction without residual deficits: Secondary | ICD-10-CM | POA: Diagnosis not present

## 2016-04-02 DIAGNOSIS — I672 Cerebral atherosclerosis: Secondary | ICD-10-CM | POA: Diagnosis not present

## 2016-04-02 DIAGNOSIS — I25810 Atherosclerosis of coronary artery bypass graft(s) without angina pectoris: Secondary | ICD-10-CM | POA: Diagnosis not present

## 2016-04-03 ENCOUNTER — Ambulatory Visit: Payer: Medicare Other | Admitting: Neurology

## 2016-04-04 DIAGNOSIS — I25810 Atherosclerosis of coronary artery bypass graft(s) without angina pectoris: Secondary | ICD-10-CM | POA: Diagnosis not present

## 2016-04-04 DIAGNOSIS — I679 Cerebrovascular disease, unspecified: Secondary | ICD-10-CM | POA: Diagnosis not present

## 2016-04-04 DIAGNOSIS — Z8673 Personal history of transient ischemic attack (TIA), and cerebral infarction without residual deficits: Secondary | ICD-10-CM | POA: Diagnosis not present

## 2016-04-04 DIAGNOSIS — I509 Heart failure, unspecified: Secondary | ICD-10-CM | POA: Diagnosis not present

## 2016-04-04 DIAGNOSIS — I672 Cerebral atherosclerosis: Secondary | ICD-10-CM | POA: Diagnosis not present

## 2016-04-04 DIAGNOSIS — F0150 Vascular dementia without behavioral disturbance: Secondary | ICD-10-CM | POA: Diagnosis not present

## 2016-04-06 DIAGNOSIS — I672 Cerebral atherosclerosis: Secondary | ICD-10-CM | POA: Diagnosis not present

## 2016-04-06 DIAGNOSIS — Z8673 Personal history of transient ischemic attack (TIA), and cerebral infarction without residual deficits: Secondary | ICD-10-CM | POA: Diagnosis not present

## 2016-04-06 DIAGNOSIS — I679 Cerebrovascular disease, unspecified: Secondary | ICD-10-CM | POA: Diagnosis not present

## 2016-04-06 DIAGNOSIS — I25810 Atherosclerosis of coronary artery bypass graft(s) without angina pectoris: Secondary | ICD-10-CM | POA: Diagnosis not present

## 2016-04-06 DIAGNOSIS — I509 Heart failure, unspecified: Secondary | ICD-10-CM | POA: Diagnosis not present

## 2016-04-06 DIAGNOSIS — F0150 Vascular dementia without behavioral disturbance: Secondary | ICD-10-CM | POA: Diagnosis not present

## 2016-04-09 DIAGNOSIS — I25810 Atherosclerosis of coronary artery bypass graft(s) without angina pectoris: Secondary | ICD-10-CM | POA: Diagnosis not present

## 2016-04-09 DIAGNOSIS — I509 Heart failure, unspecified: Secondary | ICD-10-CM | POA: Diagnosis not present

## 2016-04-09 DIAGNOSIS — I679 Cerebrovascular disease, unspecified: Secondary | ICD-10-CM | POA: Diagnosis not present

## 2016-04-09 DIAGNOSIS — Z8673 Personal history of transient ischemic attack (TIA), and cerebral infarction without residual deficits: Secondary | ICD-10-CM | POA: Diagnosis not present

## 2016-04-09 DIAGNOSIS — I672 Cerebral atherosclerosis: Secondary | ICD-10-CM | POA: Diagnosis not present

## 2016-04-09 DIAGNOSIS — F0150 Vascular dementia without behavioral disturbance: Secondary | ICD-10-CM | POA: Diagnosis not present

## 2016-04-11 DIAGNOSIS — I25810 Atherosclerosis of coronary artery bypass graft(s) without angina pectoris: Secondary | ICD-10-CM | POA: Diagnosis not present

## 2016-04-11 DIAGNOSIS — F0150 Vascular dementia without behavioral disturbance: Secondary | ICD-10-CM | POA: Diagnosis not present

## 2016-04-11 DIAGNOSIS — I672 Cerebral atherosclerosis: Secondary | ICD-10-CM | POA: Diagnosis not present

## 2016-04-11 DIAGNOSIS — Z8673 Personal history of transient ischemic attack (TIA), and cerebral infarction without residual deficits: Secondary | ICD-10-CM | POA: Diagnosis not present

## 2016-04-11 DIAGNOSIS — I509 Heart failure, unspecified: Secondary | ICD-10-CM | POA: Diagnosis not present

## 2016-04-11 DIAGNOSIS — I679 Cerebrovascular disease, unspecified: Secondary | ICD-10-CM | POA: Diagnosis not present

## 2016-04-12 ENCOUNTER — Non-Acute Institutional Stay (SKILLED_NURSING_FACILITY): Payer: Medicare Other | Admitting: Family

## 2016-04-12 ENCOUNTER — Encounter: Payer: Self-pay | Admitting: Family

## 2016-04-12 DIAGNOSIS — I25810 Atherosclerosis of coronary artery bypass graft(s) without angina pectoris: Secondary | ICD-10-CM | POA: Diagnosis not present

## 2016-04-12 DIAGNOSIS — I679 Cerebrovascular disease, unspecified: Secondary | ICD-10-CM | POA: Diagnosis not present

## 2016-04-12 DIAGNOSIS — F329 Major depressive disorder, single episode, unspecified: Secondary | ICD-10-CM

## 2016-04-12 DIAGNOSIS — F32A Depression, unspecified: Secondary | ICD-10-CM

## 2016-04-12 DIAGNOSIS — F0150 Vascular dementia without behavioral disturbance: Secondary | ICD-10-CM | POA: Diagnosis not present

## 2016-04-12 DIAGNOSIS — E039 Hypothyroidism, unspecified: Secondary | ICD-10-CM | POA: Diagnosis not present

## 2016-04-12 DIAGNOSIS — G40109 Localization-related (focal) (partial) symptomatic epilepsy and epileptic syndromes with simple partial seizures, not intractable, without status epilepticus: Secondary | ICD-10-CM

## 2016-04-12 DIAGNOSIS — I672 Cerebral atherosclerosis: Secondary | ICD-10-CM | POA: Diagnosis not present

## 2016-04-12 DIAGNOSIS — Z8673 Personal history of transient ischemic attack (TIA), and cerebral infarction without residual deficits: Secondary | ICD-10-CM | POA: Diagnosis not present

## 2016-04-12 DIAGNOSIS — I509 Heart failure, unspecified: Secondary | ICD-10-CM | POA: Diagnosis not present

## 2016-04-12 NOTE — Progress Notes (Signed)
Location:  [facility] Room Number: 1007-P Place of Service:  SNF (31) Provider:Zain Lankford FNP-C   Shirline Frees, NP  Patient Care Team: Shirline Frees, NP as PCP - General (Family Medicine)  Extended Emergency Contact Information Primary Emergency Contact: Kassner,Richard Address: [address]          [place] Home Phone: [phone] Relation: Son  Code Status: DNR  Goals of care: Advanced Directive information Advanced Directives 02/16/2016  Does Patient Have a Medical Advance Directive? Yes  Type of Advance Directive Out of facility DNR (pink MOST or yellow form)  Does patient want to make changes to medical advance directive? No - Patient declined  Copy of Healthcare Power of Attorney in Chart? -  Would patient like information on creating a medical advance directive? -     Chief Complaint  Patient presents with  . Medical Management of Chronic Issues    Routine Visit     HPI:  Pt is a 83 y.o. female seen today at [facility] for medical management of chronic diseases.She has a medical history of Alzheimer's dementia without behavioral disturbance, HTN, seizures, CHF, CAD, hypothyroidism, CKD stage 3 among other conditions. She is seen in her room today.she was observed earlier prior to visit sitting at the dinning area with yellow emesis all over her sweater. Patient Nurse notified and her sweater was changed. Later another resident called provider to evaluate patient sitting at the dinning. She had a seizure for approximately two minute. She was unresponsive with Jerking of hands and legs. She was confused and lethargic after the seizure. She was assisted back to bed by facility CNA. She was unable to participate with transfer per CNA compared to earlier when she was assisted to wheelchair. She is unable to provide any HPI and ROS. She was observed wiping her nose during visit.Facility  staff reports patient eat 100 % of lunch without any difficulties. Patient's son was notified by Hospice Nurse. Will continue to monitor for now.   Past Medical History:  Diagnosis Date  . CHF (congestive heart failure) (HCC)    Diastolic CHF. Echo (3/10) EF 45-50% with periapical akinesis. Mild LVH. Mild MR. Pseudonormal diastolic function. Echo (4/10): EF 65% with mild focal basal septal hypertrophy. Pseudonormal diastolic function. Normal wall motion. Moderate biatrial enlargement. PASP 61 mmHg. Echo (4/11): EF 55%, inferobasal hypokinesis, mild MR, PA systolic pressure 44 mmHg.  [name] Coronary artery disease    NSTEMI 3/10. LHC showed 80% mLAD, 90% mCFX. She had PCI with Endeavor 2.5 x 12 to LAD and Endeavor 3.0 x 12 to CFX  . Dementia   . Expressive aphasia syndrome    More likely atypical migraine than TIA. Carotid dopplers (8/09) with 40-60% LICA stenosis.  . Frequent falls   . Horner's syndrome    left  . Hypertension   . Hypothyroidism   . Lacunar stroke (HCC)    on CT  . SVT (supraventricular tachycardia) (HCC)    transient   No past surgical history on file.  No Known Allergies  Allergies as of 04/12/2016   No Known Allergies     Medication List       Accurate as of 04/12/16  1:11 PM. Always use your most recent med list.          aspirin EC 81 MG tablet Take 81 mg by mouth daily.   divalproex 250 MG DR tablet Commonly known as:  DEPAKOTE Take 250 mg by mouth daily. Two tablets by mouth at bedtime.   levothyroxine 25 MCG tablet Commonly known as:  SYNTHROID, LEVOTHROID Take 1 tablet (25 mcg total) by mouth daily before breakfast.   LORazepam 0.5 MG tablet Commonly known as:  ATIVAN Take 0.5 tablets (0.25 mg total) by mouth daily as needed for anxiety.   Melatonin 3 MG Tabs Take 1 tablet by mouth at bedtime.   nitroGLYCERIN 0.4 MG SL tablet Commonly known as:  NITROSTAT Place 0.4 mg under the tongue every 5 (five) minutes as needed for chest pain (3 DOSES  MAX).   sertraline 25 MG tablet Commonly known as:  ZOLOFT Take 25 mg by mouth daily.   TYLENOL 500 MG tablet Generic drug:  acetaminophen Take 500 mg by mouth 2 (two) times daily.   valsartan 40 MG tablet Commonly known as:  DIOVAN Take 1 tablet (40 mg total) by mouth 2 (two) times daily.       Review of Systems  Unable to perform ROS: Dementia    Immunization History  Administered Date(s) Administered  . Influenza Split 11/26/2011  . Influenza Whole 11/27/1998, 12/30/2006, 12/05/2007, 01/17/2009, 01/10/2010  . Influenza, High Dose Seasonal PF 03/22/2015  . Influenza,inj,Quad PF,36+ Mos 01/28/2013, 01/02/2016  . PPD Test 01/05/2016  . Pneumococcal Conjugate-13 03/22/2015  . Pneumococcal Polysaccharide-23 02/26/2005  . Td 02/26/2005   Pertinent  Health Maintenance Due  Topic Date Due  . DEXA SCAN  07/28/1987  . INFLUENZA VACCINE  Completed  . PNA vac Low Risk Adult  Completed   Fall Risk  03/22/2015 02/12/2014 01/28/2013 07/25/2012 03/26/2012  Falls in the past year? No Yes No Yes Yes  Number falls in past yr: - 2 or more - - -  Risk Factor Category  - High Fall Risk - - -  Risk for fall due to : - - Impaired balance/gait Impaired balance/gait;Impaired mobility Impaired balance/gait;Impaired mobility    Vitals:   04/12/16 1307  BP: 126/76  Pulse: 88  Resp: 18  Temp: 98.6 F (37 C)  TempSrc: Oral  SpO2: 98%  Weight: 111 lb 9.6 oz (50.6 kg)  Height: 4\' 10"  (1.473 m)   Body mass index is 23.32 kg/m. Physical Exam  Constitutional: She appears well-developed and well-nourished. No distress.  Lethargic nonverbal this visit   HENT:  Head: Normocephalic.  Mouth/Throat: Oropharynx is clear and moist. No oropharyngeal exudate.  Eyes: Conjunctivae and EOM are normal. Pupils are equal, round, and reactive to light. Right eye exhibits no discharge. Left eye exhibits no discharge. No scleral icterus.  Neck: Neck supple. No JVD present. No thyromegaly present.    Cardiovascular: Normal rate, regular rhythm, normal heart sounds and intact distal pulses.  Exam reveals no gallop and no friction rub.   No murmur heard. Pulmonary/Chest: Effort normal and breath sounds normal. No respiratory distress. She has no wheezes.  Abdominal: Soft. Bowel sounds are normal. She exhibits no distension. There is no tenderness. There is no rebound and no guarding.  Genitourinary:  Genitourinary Comments: Incontinent   Musculoskeletal: She exhibits no deformity.  Moves x 4 extremities.  Lymphadenopathy:    She has no cervical adenopathy.  Neurological: She is alert.  Nonverbal during visit. Generalized seizure witnessed.   Skin: Skin is warm and dry. No rash noted. No erythema. No pallor.  Psychiatric: She has a normal mood and affect.    Labs reviewed:  Recent Labs  02/11/16 0457 02/12/16 0443 02/13/16 0328 02/21/16  NA 140 139  140 138  K 3.3* 4.5 4.3 4.2  CL 103 109 106  --   CO2 26 21* 24  --   GLUCOSE 84 122* 90  --   BUN 13 17 19 14   CREATININE 1.00 1.11* 1.04* 0.9  CALCIUM 9.0 8.8* 9.0  --   MG 1.3*  --   --   --     Recent Labs  09/12/15 1019 01/01/16 1026 02/10/16 0348  AST 17 58* 22  ALT 13 36 16  ALKPHOS 63 56 52  BILITOT 0.5 0.9 0.2*  PROT 6.5 6.7 5.5*  ALBUMIN 3.6 3.2* 2.3*    Recent Labs  01/01/16 1026  02/09/16 1311 02/10/16 0348 02/11/16 0457 02/21/16  WBC 11.5*  < > 7.4 7.2 6.0 6.6  NEUTROABS 8.4*  --  4.8  --   --  4  HGB 12.2  < > 10.4* 10.5* 11.1* 13.2  HCT 36.2  < > 30.5* 31.3* 33.0* 42  MCV 93.3  < > 95.3 95.1 95.7  --   PLT 164  < > 142* 142* 137* 203  < > = values in this interval not displayed. Lab Results  Component Value Date   TSH 4.261 01/01/2016   Lab Results  Component Value Date   HGBA1C 5.0 02/10/2016   Lab Results  Component Value Date   CHOL 110 02/10/2016   HDL 53 02/10/2016   LDLCALC 43 02/10/2016   LDLDIRECT 36.0 03/22/2009   TRIG 70 02/10/2016   CHOLHDL 2.1 02/10/2016    Assessment/Plan  1. Seizure disorder Generalized seizure witnessed while sitting on wheelchair at the dinning area. Nonverbal during visit.Currently on hospice service. POA notified by Hospice Nurse. Will continue to monitor.Continue on Depakote and Ativan. Safety precautions.   2. Hypothyroidism  Continue on Levothyroxine. Monitor TSH level if  Labs desired by family.   3.Depression Stable. Continue on Sertraline. Monitor for mood changes.    Family/ staff Communication: Reviewed plan of care with facility Nurse supervisor.   Labs/tests ordered: None

## 2016-04-13 DIAGNOSIS — I672 Cerebral atherosclerosis: Secondary | ICD-10-CM | POA: Diagnosis not present

## 2016-04-13 DIAGNOSIS — I509 Heart failure, unspecified: Secondary | ICD-10-CM | POA: Diagnosis not present

## 2016-04-13 DIAGNOSIS — I679 Cerebrovascular disease, unspecified: Secondary | ICD-10-CM | POA: Diagnosis not present

## 2016-04-13 DIAGNOSIS — I25810 Atherosclerosis of coronary artery bypass graft(s) without angina pectoris: Secondary | ICD-10-CM | POA: Diagnosis not present

## 2016-04-13 DIAGNOSIS — F0150 Vascular dementia without behavioral disturbance: Secondary | ICD-10-CM | POA: Diagnosis not present

## 2016-04-13 DIAGNOSIS — Z8673 Personal history of transient ischemic attack (TIA), and cerebral infarction without residual deficits: Secondary | ICD-10-CM | POA: Diagnosis not present

## 2016-04-16 DIAGNOSIS — I509 Heart failure, unspecified: Secondary | ICD-10-CM | POA: Diagnosis not present

## 2016-04-16 DIAGNOSIS — F0150 Vascular dementia without behavioral disturbance: Secondary | ICD-10-CM | POA: Diagnosis not present

## 2016-04-16 DIAGNOSIS — I679 Cerebrovascular disease, unspecified: Secondary | ICD-10-CM | POA: Diagnosis not present

## 2016-04-16 DIAGNOSIS — I25810 Atherosclerosis of coronary artery bypass graft(s) without angina pectoris: Secondary | ICD-10-CM | POA: Diagnosis not present

## 2016-04-16 DIAGNOSIS — Z8673 Personal history of transient ischemic attack (TIA), and cerebral infarction without residual deficits: Secondary | ICD-10-CM | POA: Diagnosis not present

## 2016-04-16 DIAGNOSIS — I672 Cerebral atherosclerosis: Secondary | ICD-10-CM | POA: Diagnosis not present

## 2016-04-18 DIAGNOSIS — I25810 Atherosclerosis of coronary artery bypass graft(s) without angina pectoris: Secondary | ICD-10-CM | POA: Diagnosis not present

## 2016-04-18 DIAGNOSIS — F0150 Vascular dementia without behavioral disturbance: Secondary | ICD-10-CM | POA: Diagnosis not present

## 2016-04-18 DIAGNOSIS — I679 Cerebrovascular disease, unspecified: Secondary | ICD-10-CM | POA: Diagnosis not present

## 2016-04-18 DIAGNOSIS — I509 Heart failure, unspecified: Secondary | ICD-10-CM | POA: Diagnosis not present

## 2016-04-18 DIAGNOSIS — Z8673 Personal history of transient ischemic attack (TIA), and cerebral infarction without residual deficits: Secondary | ICD-10-CM | POA: Diagnosis not present

## 2016-04-18 DIAGNOSIS — I672 Cerebral atherosclerosis: Secondary | ICD-10-CM | POA: Diagnosis not present

## 2016-04-20 DIAGNOSIS — I25810 Atherosclerosis of coronary artery bypass graft(s) without angina pectoris: Secondary | ICD-10-CM | POA: Diagnosis not present

## 2016-04-20 DIAGNOSIS — I679 Cerebrovascular disease, unspecified: Secondary | ICD-10-CM | POA: Diagnosis not present

## 2016-04-20 DIAGNOSIS — F0150 Vascular dementia without behavioral disturbance: Secondary | ICD-10-CM | POA: Diagnosis not present

## 2016-04-20 DIAGNOSIS — Z8673 Personal history of transient ischemic attack (TIA), and cerebral infarction without residual deficits: Secondary | ICD-10-CM | POA: Diagnosis not present

## 2016-04-20 DIAGNOSIS — I672 Cerebral atherosclerosis: Secondary | ICD-10-CM | POA: Diagnosis not present

## 2016-04-20 DIAGNOSIS — I509 Heart failure, unspecified: Secondary | ICD-10-CM | POA: Diagnosis not present

## 2016-04-21 ENCOUNTER — Encounter (HOSPITAL_COMMUNITY): Payer: Self-pay

## 2016-04-21 ENCOUNTER — Emergency Department (HOSPITAL_COMMUNITY)
Admission: EM | Admit: 2016-04-21 | Discharge: 2016-04-21 | Disposition: A | Discharge status: Home / Self Care | Attending: Emergency Medicine | Admitting: Emergency Medicine

## 2016-04-21 DIAGNOSIS — W19XXXA Unspecified fall, initial encounter: Secondary | ICD-10-CM

## 2016-04-21 DIAGNOSIS — G309 Alzheimer's disease, unspecified: Secondary | ICD-10-CM | POA: Diagnosis not present

## 2016-04-21 DIAGNOSIS — I2510 Atherosclerotic heart disease of native coronary artery without angina pectoris: Secondary | ICD-10-CM | POA: Diagnosis not present

## 2016-04-21 DIAGNOSIS — Y9389 Activity, other specified: Secondary | ICD-10-CM | POA: Diagnosis not present

## 2016-04-21 DIAGNOSIS — I252 Old myocardial infarction: Secondary | ICD-10-CM | POA: Diagnosis not present

## 2016-04-21 DIAGNOSIS — S0990XA Unspecified injury of head, initial encounter: Secondary | ICD-10-CM | POA: Diagnosis not present

## 2016-04-21 DIAGNOSIS — Y92129 Unspecified place in nursing home as the place of occurrence of the external cause: Secondary | ICD-10-CM | POA: Insufficient documentation

## 2016-04-21 DIAGNOSIS — N183 Chronic kidney disease, stage 3 (moderate): Secondary | ICD-10-CM | POA: Insufficient documentation

## 2016-04-21 DIAGNOSIS — Y999 Unspecified external cause status: Secondary | ICD-10-CM | POA: Insufficient documentation

## 2016-04-21 DIAGNOSIS — Z79899 Other long term (current) drug therapy: Secondary | ICD-10-CM | POA: Diagnosis not present

## 2016-04-21 DIAGNOSIS — I130 Hypertensive heart and chronic kidney disease with heart failure and stage 1 through stage 4 chronic kidney disease, or unspecified chronic kidney disease: Secondary | ICD-10-CM | POA: Diagnosis not present

## 2016-04-21 DIAGNOSIS — Z7982 Long term (current) use of aspirin: Secondary | ICD-10-CM | POA: Diagnosis not present

## 2016-04-21 DIAGNOSIS — E039 Hypothyroidism, unspecified: Secondary | ICD-10-CM | POA: Insufficient documentation

## 2016-04-21 DIAGNOSIS — I509 Heart failure, unspecified: Secondary | ICD-10-CM | POA: Diagnosis not present

## 2016-04-21 DIAGNOSIS — I672 Cerebral atherosclerosis: Secondary | ICD-10-CM | POA: Diagnosis not present

## 2016-04-21 DIAGNOSIS — I5032 Chronic diastolic (congestive) heart failure: Secondary | ICD-10-CM | POA: Diagnosis not present

## 2016-04-21 DIAGNOSIS — S0180XA Unspecified open wound of other part of head, initial encounter: Secondary | ICD-10-CM | POA: Diagnosis not present

## 2016-04-21 DIAGNOSIS — S0181XA Laceration without foreign body of other part of head, initial encounter: Secondary | ICD-10-CM | POA: Diagnosis not present

## 2016-04-21 DIAGNOSIS — Z8673 Personal history of transient ischemic attack (TIA), and cerebral infarction without residual deficits: Secondary | ICD-10-CM | POA: Insufficient documentation

## 2016-04-21 DIAGNOSIS — F0150 Vascular dementia without behavioral disturbance: Secondary | ICD-10-CM | POA: Diagnosis not present

## 2016-04-21 DIAGNOSIS — W06XXXA Fall from bed, initial encounter: Secondary | ICD-10-CM | POA: Insufficient documentation

## 2016-04-21 DIAGNOSIS — I679 Cerebrovascular disease, unspecified: Secondary | ICD-10-CM | POA: Diagnosis not present

## 2016-04-21 DIAGNOSIS — I25810 Atherosclerosis of coronary artery bypass graft(s) without angina pectoris: Secondary | ICD-10-CM | POA: Diagnosis not present

## 2016-04-21 DIAGNOSIS — R259 Unspecified abnormal involuntary movements: Secondary | ICD-10-CM | POA: Diagnosis not present

## 2016-04-21 DIAGNOSIS — S0191XA Laceration without foreign body of unspecified part of head, initial encounter: Secondary | ICD-10-CM | POA: Diagnosis not present

## 2016-04-21 MED ORDER — LIDOCAINE HCL (PF) 1 % IJ SOLN
10.0000 mL | Freq: Once | INTRAMUSCULAR | Status: AC
  Administered 2016-04-21: 10 mL
  Filled 2016-04-21: qty 10

## 2016-04-21 NOTE — ED Provider Notes (Signed)
[facility]-EMERGENCY DEPT Provider Note   CSN: [id number] Arrival date & time: 04/21/16  0532     History   Chief Complaint Chief Complaint  Patient presents with  . Fall    HPI [name] is a 83 y.o. female.  Patient with h/o dementia, CHF, CAD, stroke with deviation to right -- presents after a fall today. Patient is unable to walk and she got up out of bed and fell to the floor striking the left forehead. Level V caveat 2/2 dementia.       Past Medical History:  Diagnosis Date  . CHF (congestive heart failure) (HCC)    Diastolic CHF. Echo (3/10) EF 45-50% with periapical akinesis. Mild LVH. Mild MR. Pseudonormal diastolic function. Echo (4/10): EF 65% with mild focal basal septal hypertrophy. Pseudonormal diastolic function. Normal wall motion. Moderate biatrial enlargement. PASP 61 mmHg. Echo (4/11): EF 55%, inferobasal hypokinesis, mild MR, PA systolic pressure 44 mmHg.  [name] Coronary artery disease    NSTEMI 3/10. LHC showed 80% mLAD, 90% mCFX. She had PCI with Endeavor 2.5 x 12 to LAD and Endeavor 3.0 x 12 to CFX  . Dementia   . Expressive aphasia syndrome    More likely atypical migraine than TIA. Carotid dopplers (8/09) with 40-60% LICA stenosis.  . Frequent falls   . Horner's syndrome    left  . Hypertension   . Hypothyroidism   . Lacunar stroke (HCC)    on CT  . SVT (supraventricular tachycardia) (HCC)    transient    Patient Active Problem List   Diagnosis Date Noted  . Aspiration pneumonia (HCC)   . Hypokalemia   . Acute cystitis without hematuria   . Encephalopathy acute   . Stroke-like symptoms   . CKD (chronic kidney disease) stage 3, GFR 30-59 ml/min   . Alzheimer's dementia without behavioral disturbance   . Dementia without behavioral disturbance   . Troponin I above reference range 01/01/2016  . UTI (urinary tract infection) 01/01/2016  . General weakness 01/01/2016  . Abnormality of gait 02/07/2015  . Memory loss 07/17/2013  . Seizure  disorder, focal motor (HCC) 03/26/2012  . Carotid stenosis 06/26/2011  . Depression 05/10/2010  . HYPERLIPIDEMIA-MIXED 11/05/2008  . Chronic diastolic CHF (congestive heart failure) (HCC) 09/17/2008  . UNSPECIFIED VENOUS INSUFFICIENCY 06/09/2008  . CORONARY ATHEROSCLEROSIS NATIVE CORONARY ARTERY 05/12/2008  . Hypothyroidism 12/05/2007  . CONSTIPATION, DRUG INDUCED 09/02/2007  . BREAST CYST 09/02/2007  . UNS ADVRS EFF UNS RX MEDICINAL&BIOLOGICAL SBSTNC 06/04/2007  . Essential hypertension 07/25/2006  . OSTEOPOROSIS 07/25/2006  . CEREBROVASCULAR ACCIDENT, HX OF 07/25/2006    History reviewed. No pertinent surgical history.  OB History    No data available       Home Medications    Prior to Admission medications   Medication Sig Start Date End Date Taking? Authorizing Provider  acetaminophen (TYLENOL) 500 MG tablet Take 500 mg by mouth 2 (two) times daily.     Historical Provider, MD  aspirin EC 81 MG tablet Take 81 mg by mouth daily.     Historical Provider, MD  divalproex (DEPAKOTE) 250 MG DR tablet Take 250 mg by mouth daily. Two tablets by mouth at bedtime.    Historical Provider, MD  levothyroxine (SYNTHROID, LEVOTHROID) 25 MCG tablet Take 1 tablet (25 mcg total) by mouth daily before breakfast. 01/05/16   Merlene Laughter, DO  LORazepam (ATIVAN) 0.5 MG tablet Take 0.5 tablets (0.25 mg total) by mouth daily as needed for anxiety. 03/30/16  Dinah C Ngetich, NP  Melatonin 3 MG TABS Take 1 tablet by mouth at bedtime.    Historical Provider, MD  nitroGLYCERIN (NITROSTAT) 0.4 MG SL tablet Place 0.4 mg under the tongue every 5 (five) minutes as needed for chest pain (3 DOSES MAX).    Historical Provider, MD  sertraline (ZOLOFT) 25 MG tablet Take 25 mg by mouth daily.    Historical Provider, MD  valsartan (DIOVAN) 40 MG tablet Take 1 tablet (40 mg total) by mouth 2 (two) times daily. 02/13/16   Osvaldo ShipperGokul Krishnan, MD    Family History Family History  Problem Relation Age of Onset  .  Hypertension Mother   . COPD Father   . Heart attack Neg Hx     Social History Social History  Substance Use Topics  . Smoking status: Former Smoker    Types: Cigarettes    Quit date: 05/25/1950  . Smokeless tobacco: Never Used  . Alcohol use No     Allergies   Patient has no known allergies.   Review of Systems Review of Systems  Unable to perform ROS: Dementia     Physical Exam Updated Vital Signs BP 154/61 (BP Location: Left Arm)   Pulse 67   Temp 97.7 F (36.5 C) (Oral)   Resp 14   Ht 4\' 10"  (1.473 m)   Wt 50.3 kg   SpO2 96%   BMI 23.20 kg/m   Physical Exam  Constitutional: She appears well-developed and well-nourished.  HENT:  Head: Normocephalic.  Mouth/Throat: Oropharynx is clear and moist.  Contusion to left forehead with 2 cm laceration.   Eyes: Conjunctivae are normal. Right eye exhibits no discharge. Left eye exhibits no discharge.  Neck: Normal range of motion. Neck supple.  Cardiovascular: Normal rate, regular rhythm and normal heart sounds.   Pulmonary/Chest: Effort normal and breath sounds normal.  Abdominal: Soft. There is no tenderness.  Musculoskeletal:  No hip pain on exam with ranging of hips. Left hand contusion, no deformity.   Neurological: She is alert.  Skin: Skin is warm and dry.  Psychiatric: She has a normal mood and affect.  Nursing note and vitals reviewed.    ED Treatments / Results   Procedures Procedures (including critical care time)  Medications Ordered in ED Medications  lidocaine (PF) (XYLOCAINE) 1 % injection 10 mL (10 mLs Infiltration Given 04/21/16 0651)     Initial Impression / Assessment and Plan / ED Course  I have reviewed the triage vital signs and the nursing notes.  Pertinent labs & imaging results that were available during my care of the patient were reviewed by me and considered in my medical decision making (see chart for details).    Patient seen and examined. Spoke with son at bedside  regarding extensive workup he wants us to undertake given that the patient is full comfort care or hospice. He requested I call the hospice nurse to discuss with them.  I spoke with RN Shanda BumpsJessica. They agree that no extensive workup is indicated in this patient currently. Will clean and repair wound. They will have a nurse come see the patient today. Son updated and agrees with plan.    Vital signs reviewed and are as follows: BP 154/61 (BP Location: Left Arm)   Pulse 67   Temp 97.7 F (36.5 C) (Oral)   Resp 14   Ht 4\' 10"  (1.473 m)   Wt 50.3 kg   SpO2 96%   BMI 23.20 kg/m   LACERATION REPAIR Performed  by: Carolee Rota Authorized by: Carolee Rota Consent: Verbal consent obtained. Risks and benefits: risks, benefits and alternatives were discussed Consent given by: son Patient identity confirmed: provided demographic data Prepped and Draped in normal sterile fashion Wound explored  Laceration Location: left forehead  Laceration Length: 2cm  No Foreign Bodies seen or palpated  Anesthesia: local infiltration  Local anesthetic: lidocaine 1% without epinephrine  Anesthetic total: 4 ml  Irrigation method: skin scrub with dermal cleanser Amount of cleaning: standard  Skin closure: 5-0 Ethilon  Number of sutures: 3  Technique: simple interrupted  Patient tolerance: Patient tolerated the procedure well with no immediate complications.    Patient seen by Dr. Bebe Shaggy. Plan is for the patient to return to [facility]. Will need transport.    Final Clinical Impressions(s) / ED Diagnoses   Final diagnoses:  Fall, initial encounter  Facial laceration, initial encounter   Patient with history of dementia on aspirin presents with fall. She is on hospice. Wound repaired. After discussion with son and hospice, decision made not to pursue any further testing or imaging today. Wound repaired as above.   New Prescriptions New Prescriptions   No medications on file      Renne Crigler, [place] 04/21/16 0746    Zadie Rhine, MD 04/21/16 [phone]

## 2016-04-21 NOTE — Discharge Instructions (Signed)
Please read and follow all provided instructions.  Your diagnoses today include:  1. Fall, initial encounter   2. Facial laceration, initial encounter     Tests performed today include:  Vital signs. See below for your results today.   Medications prescribed:   None  Take any prescribed medications only as directed.  Home care instructions:  Follow any educational materials contained in this packet.  Hold aspirin for the next 3 days.   Sutures will need removed in 7-10 days.   Follow-up instructions: Please follow-up with your primary care provider as needed for further evaluation of your symptoms.   Return instructions:  SEEK IMMEDIATE MEDICAL ATTENTION IF:  There is confusion or drowsiness (although children frequently become drowsy after injury).   You cannot awaken the injured person.   You have more than one episode of vomiting.   You notice dizziness or unsteadiness which is getting worse, or inability to walk.   You have convulsions or unconsciousness.   You experience severe, persistent headaches not relieved by Tylenol.  You cannot use arms or legs normally.   There are changes in pupil sizes. (This is the black center in the colored part of the eye)   There is clear or bloody discharge from the nose or ears.   You have change in speech, vision, swallowing, or understanding.   Localized weakness, numbness, tingling, or change in bowel or bladder control.  You have any other emergent concerns.  Additional Information: You have had a head injury which does not appear to require admission at this time.  Your vital signs today were: BP 163/63    Pulse 68    Temp 97.7 F (36.5 C) (Oral)    Resp 14    Ht 4\' 10"  (1.473 m)    Wt 50.3 kg    SpO2 97%    BMI 23.20 kg/m  If your blood pressure (BP) was elevated above 135/85 this visit, please have this repeated by your doctor within one month. --------------

## 2016-04-21 NOTE — ED Provider Notes (Signed)
Patient seen/examined in the Emergency Department in conjunction with Midlevel Provider Geiple Patient with fall from nursing home, she is on hospice Exam : awake/alert, pleasantly confused, laceration noted to left forehead Plan: repair laceration and discharge back to facility    Kathryn Rhineonald Harley Mccartney, MD 04/21/16 [phone]

## 2016-04-21 NOTE — ED Triage Notes (Signed)
Pt [facility] c/o fall from bed.  Pt has lac and hematoma to left forehead, bruising to left hand, takes ASA.  Hx stroke, dementia

## 2016-04-23 DIAGNOSIS — F0150 Vascular dementia without behavioral disturbance: Secondary | ICD-10-CM | POA: Diagnosis not present

## 2016-04-23 DIAGNOSIS — I679 Cerebrovascular disease, unspecified: Secondary | ICD-10-CM | POA: Diagnosis not present

## 2016-04-23 DIAGNOSIS — I25810 Atherosclerosis of coronary artery bypass graft(s) without angina pectoris: Secondary | ICD-10-CM | POA: Diagnosis not present

## 2016-04-23 DIAGNOSIS — I672 Cerebral atherosclerosis: Secondary | ICD-10-CM | POA: Diagnosis not present

## 2016-04-23 DIAGNOSIS — I509 Heart failure, unspecified: Secondary | ICD-10-CM | POA: Diagnosis not present

## 2016-04-23 DIAGNOSIS — Z8673 Personal history of transient ischemic attack (TIA), and cerebral infarction without residual deficits: Secondary | ICD-10-CM | POA: Diagnosis not present

## 2016-04-25 DIAGNOSIS — I25810 Atherosclerosis of coronary artery bypass graft(s) without angina pectoris: Secondary | ICD-10-CM | POA: Diagnosis not present

## 2016-04-25 DIAGNOSIS — I679 Cerebrovascular disease, unspecified: Secondary | ICD-10-CM | POA: Diagnosis not present

## 2016-04-25 DIAGNOSIS — F0150 Vascular dementia without behavioral disturbance: Secondary | ICD-10-CM | POA: Diagnosis not present

## 2016-04-25 DIAGNOSIS — Z8673 Personal history of transient ischemic attack (TIA), and cerebral infarction without residual deficits: Secondary | ICD-10-CM | POA: Diagnosis not present

## 2016-04-25 DIAGNOSIS — I672 Cerebral atherosclerosis: Secondary | ICD-10-CM | POA: Diagnosis not present

## 2016-04-25 DIAGNOSIS — I509 Heart failure, unspecified: Secondary | ICD-10-CM | POA: Diagnosis not present

## 2016-04-26 DIAGNOSIS — N390 Urinary tract infection, site not specified: Secondary | ICD-10-CM | POA: Diagnosis not present

## 2016-04-26 DIAGNOSIS — I679 Cerebrovascular disease, unspecified: Secondary | ICD-10-CM | POA: Diagnosis not present

## 2016-04-26 DIAGNOSIS — E039 Hypothyroidism, unspecified: Secondary | ICD-10-CM | POA: Diagnosis not present

## 2016-04-26 DIAGNOSIS — I471 Supraventricular tachycardia: Secondary | ICD-10-CM | POA: Diagnosis not present

## 2016-04-26 DIAGNOSIS — R1310 Dysphagia, unspecified: Secondary | ICD-10-CM | POA: Diagnosis not present

## 2016-04-26 DIAGNOSIS — F339 Major depressive disorder, recurrent, unspecified: Secondary | ICD-10-CM | POA: Diagnosis not present

## 2016-04-26 DIAGNOSIS — I25810 Atherosclerosis of coronary artery bypass graft(s) without angina pectoris: Secondary | ICD-10-CM | POA: Diagnosis not present

## 2016-04-26 DIAGNOSIS — Z8673 Personal history of transient ischemic attack (TIA), and cerebral infarction without residual deficits: Secondary | ICD-10-CM | POA: Diagnosis not present

## 2016-04-26 DIAGNOSIS — I672 Cerebral atherosclerosis: Secondary | ICD-10-CM | POA: Diagnosis not present

## 2016-04-26 DIAGNOSIS — F0150 Vascular dementia without behavioral disturbance: Secondary | ICD-10-CM | POA: Diagnosis not present

## 2016-04-26 DIAGNOSIS — R569 Unspecified convulsions: Secondary | ICD-10-CM | POA: Diagnosis not present

## 2016-04-26 DIAGNOSIS — I10 Essential (primary) hypertension: Secondary | ICD-10-CM | POA: Diagnosis not present

## 2016-04-26 DIAGNOSIS — I509 Heart failure, unspecified: Secondary | ICD-10-CM | POA: Diagnosis not present

## 2016-04-26 DIAGNOSIS — R54 Age-related physical debility: Secondary | ICD-10-CM | POA: Diagnosis not present

## 2016-04-26 DIAGNOSIS — R4701 Aphasia: Secondary | ICD-10-CM | POA: Diagnosis not present

## 2016-04-27 DIAGNOSIS — I25810 Atherosclerosis of coronary artery bypass graft(s) without angina pectoris: Secondary | ICD-10-CM | POA: Diagnosis not present

## 2016-04-27 DIAGNOSIS — F0150 Vascular dementia without behavioral disturbance: Secondary | ICD-10-CM | POA: Diagnosis not present

## 2016-04-27 DIAGNOSIS — I679 Cerebrovascular disease, unspecified: Secondary | ICD-10-CM | POA: Diagnosis not present

## 2016-04-27 DIAGNOSIS — Z8673 Personal history of transient ischemic attack (TIA), and cerebral infarction without residual deficits: Secondary | ICD-10-CM | POA: Diagnosis not present

## 2016-04-27 DIAGNOSIS — I672 Cerebral atherosclerosis: Secondary | ICD-10-CM | POA: Diagnosis not present

## 2016-04-27 DIAGNOSIS — I509 Heart failure, unspecified: Secondary | ICD-10-CM | POA: Diagnosis not present

## 2016-04-30 DIAGNOSIS — I25810 Atherosclerosis of coronary artery bypass graft(s) without angina pectoris: Secondary | ICD-10-CM | POA: Diagnosis not present

## 2016-04-30 DIAGNOSIS — I672 Cerebral atherosclerosis: Secondary | ICD-10-CM | POA: Diagnosis not present

## 2016-04-30 DIAGNOSIS — F0150 Vascular dementia without behavioral disturbance: Secondary | ICD-10-CM | POA: Diagnosis not present

## 2016-04-30 DIAGNOSIS — I509 Heart failure, unspecified: Secondary | ICD-10-CM | POA: Diagnosis not present

## 2016-04-30 DIAGNOSIS — Z8673 Personal history of transient ischemic attack (TIA), and cerebral infarction without residual deficits: Secondary | ICD-10-CM | POA: Diagnosis not present

## 2016-04-30 DIAGNOSIS — I679 Cerebrovascular disease, unspecified: Secondary | ICD-10-CM | POA: Diagnosis not present

## 2016-05-01 DIAGNOSIS — I679 Cerebrovascular disease, unspecified: Secondary | ICD-10-CM | POA: Diagnosis not present

## 2016-05-01 DIAGNOSIS — I25810 Atherosclerosis of coronary artery bypass graft(s) without angina pectoris: Secondary | ICD-10-CM | POA: Diagnosis not present

## 2016-05-01 DIAGNOSIS — I672 Cerebral atherosclerosis: Secondary | ICD-10-CM | POA: Diagnosis not present

## 2016-05-01 DIAGNOSIS — F0150 Vascular dementia without behavioral disturbance: Secondary | ICD-10-CM | POA: Diagnosis not present

## 2016-05-01 DIAGNOSIS — Z8673 Personal history of transient ischemic attack (TIA), and cerebral infarction without residual deficits: Secondary | ICD-10-CM | POA: Diagnosis not present

## 2016-05-01 DIAGNOSIS — I509 Heart failure, unspecified: Secondary | ICD-10-CM | POA: Diagnosis not present

## 2016-05-02 DIAGNOSIS — I679 Cerebrovascular disease, unspecified: Secondary | ICD-10-CM | POA: Diagnosis not present

## 2016-05-02 DIAGNOSIS — I25810 Atherosclerosis of coronary artery bypass graft(s) without angina pectoris: Secondary | ICD-10-CM | POA: Diagnosis not present

## 2016-05-02 DIAGNOSIS — I509 Heart failure, unspecified: Secondary | ICD-10-CM | POA: Diagnosis not present

## 2016-05-02 DIAGNOSIS — F0150 Vascular dementia without behavioral disturbance: Secondary | ICD-10-CM | POA: Diagnosis not present

## 2016-05-02 DIAGNOSIS — I672 Cerebral atherosclerosis: Secondary | ICD-10-CM | POA: Diagnosis not present

## 2016-05-02 DIAGNOSIS — Z8673 Personal history of transient ischemic attack (TIA), and cerebral infarction without residual deficits: Secondary | ICD-10-CM | POA: Diagnosis not present

## 2016-05-04 DIAGNOSIS — F0150 Vascular dementia without behavioral disturbance: Secondary | ICD-10-CM | POA: Diagnosis not present

## 2016-05-04 DIAGNOSIS — I25810 Atherosclerosis of coronary artery bypass graft(s) without angina pectoris: Secondary | ICD-10-CM | POA: Diagnosis not present

## 2016-05-04 DIAGNOSIS — I672 Cerebral atherosclerosis: Secondary | ICD-10-CM | POA: Diagnosis not present

## 2016-05-04 DIAGNOSIS — Z8673 Personal history of transient ischemic attack (TIA), and cerebral infarction without residual deficits: Secondary | ICD-10-CM | POA: Diagnosis not present

## 2016-05-04 DIAGNOSIS — I679 Cerebrovascular disease, unspecified: Secondary | ICD-10-CM | POA: Diagnosis not present

## 2016-05-04 DIAGNOSIS — I509 Heart failure, unspecified: Secondary | ICD-10-CM | POA: Diagnosis not present

## 2016-05-07 ENCOUNTER — Non-Acute Institutional Stay (SKILLED_NURSING_FACILITY): Payer: Medicare Other | Admitting: Family

## 2016-05-07 DIAGNOSIS — F0390 Unspecified dementia without behavioral disturbance: Secondary | ICD-10-CM | POA: Diagnosis not present

## 2016-05-07 DIAGNOSIS — Z8673 Personal history of transient ischemic attack (TIA), and cerebral infarction without residual deficits: Secondary | ICD-10-CM | POA: Diagnosis not present

## 2016-05-07 DIAGNOSIS — I25810 Atherosclerosis of coronary artery bypass graft(s) without angina pectoris: Secondary | ICD-10-CM | POA: Diagnosis not present

## 2016-05-07 DIAGNOSIS — E039 Hypothyroidism, unspecified: Secondary | ICD-10-CM

## 2016-05-07 DIAGNOSIS — G40109 Localization-related (focal) (partial) symptomatic epilepsy and epileptic syndromes with simple partial seizures, not intractable, without status epilepticus: Secondary | ICD-10-CM | POA: Diagnosis not present

## 2016-05-07 DIAGNOSIS — I5032 Chronic diastolic (congestive) heart failure: Secondary | ICD-10-CM | POA: Diagnosis not present

## 2016-05-07 DIAGNOSIS — I679 Cerebrovascular disease, unspecified: Secondary | ICD-10-CM | POA: Diagnosis not present

## 2016-05-07 DIAGNOSIS — F0150 Vascular dementia without behavioral disturbance: Secondary | ICD-10-CM | POA: Diagnosis not present

## 2016-05-07 DIAGNOSIS — I509 Heart failure, unspecified: Secondary | ICD-10-CM | POA: Diagnosis not present

## 2016-05-07 DIAGNOSIS — I672 Cerebral atherosclerosis: Secondary | ICD-10-CM | POA: Diagnosis not present

## 2016-05-07 NOTE — Progress Notes (Signed)
Location:  [facility] Room Number: 1007 P  Place of Service:  SNF (31) Provider:Delia Sitar FNP-C   Shirline Freesory Nafziger, NP  Patient Care Team: Shirline Freesory Nafziger, NP as PCP - General (Family Medicine)  Extended Emergency Contact Information Primary Emergency Contact: Jefcoat,Richard Address: [address]          [place] Home Phone: [phone] Relation: Son  Code Status: DNR  Goals of care: Advanced Directive information Advanced Directives 02/16/2016  Does Patient Have a Medical Advance Directive? Yes  Type of Advance Directive Out of facility DNR (pink MOST or yellow form)  Does patient want to make changes to medical advance directive? No - Patient declined  Copy of Healthcare Power of Attorney in Chart? -  Would patient like information on creating a medical advance directive? -     Chief Complaint  Patient presents with  . Medical Management of Chronic Issues    HPI:  Pt is a 83 y.o. female seen today at [facility] for medical management of chronic diseases.She has a medical history of Alzheimer's dementia without behavioral disturbance, HTN, seizures, CHF, CAD, hypothyroidism, CKD stage 3 among other conditions. She is seen in her room today.She is unable to provide any HPI and ROS due to presence of dementia.weight loss noted though facility weight log inconsistency noted  ( January wt 108.6 lbs; February 111.6 lbs; march 109 lbs ). Will continue to monitor for now. Facility Nurse reports no new concerns.hospice services continues to follow up.    Past Medical History:  Diagnosis Date  . CHF (congestive heart failure) (HCC)    Diastolic CHF. Echo (3/10) EF 45-50% with periapical akinesis. Mild LVH. Mild MR. Pseudonormal diastolic function. Echo (4/10): EF 65% with mild focal basal septal hypertrophy. Pseudonormal diastolic function. Normal wall motion. Moderate biatrial enlargement. PASP 61  mmHg. Echo (4/11): EF 55%, inferobasal hypokinesis, mild MR, PA systolic pressure 44 mmHg.  [name]. Coronary artery disease    NSTEMI 3/10. LHC showed 80% mLAD, 90% mCFX. She had PCI with Endeavor 2.5 x 12 to LAD and Endeavor 3.0 x 12 to CFX  . Dementia   . Expressive aphasia syndrome    More likely atypical migraine than TIA. Carotid dopplers (8/09) with 40-60% LICA stenosis.  . Frequent falls   . Horner's syndrome    left  . Hypertension   . Hypothyroidism   . Lacunar stroke (HCC)    on CT  . SVT (supraventricular tachycardia) (HCC)    transient   No past surgical history on file.  No Known Allergies  Allergies as of 05/07/2016   No Known Allergies     Medication List       Accurate as of 05/07/16 11:21 PM. Always use your most recent med list.          aspirin EC 81 MG tablet Take 81 mg by mouth daily.   divalproex 250 MG DR tablet Commonly known as:  DEPAKOTE Take 250 mg by mouth daily. Two tablets by mouth at bedtime.   levothyroxine 25 MCG tablet Commonly known as:  SYNTHROID, LEVOTHROID Take 1 tablet (25 mcg total) by mouth daily before breakfast.   LORazepam 0.5 MG tablet Commonly known as:  ATIVAN Take 0.5 tablets (0.25 mg total) by mouth daily as needed for anxiety.   Melatonin 3 MG Tabs Take 1 tablet by mouth at bedtime.   nitroGLYCERIN 0.4 MG SL tablet Commonly known as:  NITROSTAT Place 0.4 mg under the tongue every 5 (five) minutes as needed for chest pain (3 DOSES MAX).   sertraline 25 MG tablet Commonly known as:  ZOLOFT Take 25 mg by mouth daily.   TYLENOL 500 MG tablet Generic drug:  acetaminophen Take 500 mg by mouth 2 (two) times daily.   valsartan 40 MG tablet Commonly known as:  DIOVAN Take 1 tablet (40 mg total) by mouth 2 (two) times daily.       Review of Systems  Unable to perform ROS: Dementia    Immunization History  Administered Date(s) Administered  . Influenza Split 11/26/2011  . Influenza Whole 11/27/1998, 12/30/2006,  12/05/2007, 01/17/2009, 01/10/2010  . Influenza, High Dose Seasonal PF 03/22/2015  . Influenza,inj,Quad PF,36+ Mos 01/28/2013, 01/02/2016  . PPD Test 01/05/2016  . Pneumococcal Conjugate-13 03/22/2015  . Pneumococcal Polysaccharide-23 02/26/2005  . Td 02/26/2005   Pertinent  Health Maintenance Due  Topic Date Due  . DEXA SCAN  07/28/1987  . INFLUENZA VACCINE  Completed  . PNA vac Low Risk Adult  Completed   Fall Risk  03/22/2015 02/12/2014 01/28/2013 07/25/2012 03/26/2012  Falls in the past year? No Yes No Yes Yes  Number falls in past yr: - 2 or more - - -  Risk Factor Category  - High Fall Risk - - -  Risk for fall due to : - - Impaired balance/gait Impaired balance/gait;Impaired mobility Impaired balance/gait;Impaired mobility    Vitals:   05/07/16 1100  BP: 128/76  Pulse: 74  Resp: 18  Temp: 97.8 F (36.6 C)  SpO2: 97%  Weight: 109 lb (49.4 kg)  Height: 4\' 10"  (1.473 m)   Body mass index is 22.78 kg/m. Physical Exam  Constitutional: She appears well-developed and well-nourished. No distress.  Pleasantly confused at baseline  HENT:  Head: Normocephalic.  Mouth/Throat: Oropharynx is clear and moist. No oropharyngeal exudate.  Eyes: Conjunctivae and EOM are normal. Pupils are equal, round, and reactive to light. Right eye exhibits no discharge. Left eye exhibits no discharge. No scleral icterus.  Neck: Neck supple. No JVD present. No thyromegaly present.  Cardiovascular: Normal rate, regular rhythm, normal heart sounds and intact distal pulses.  Exam reveals no gallop and no friction rub.   No murmur heard. Pulmonary/Chest: Effort normal and breath sounds normal. No respiratory distress. She has no wheezes.  Abdominal: Soft. Bowel sounds are normal. She exhibits no distension. There is no tenderness. There is no rebound and no guarding.  Genitourinary:  Genitourinary Comments: Incontinent   Musculoskeletal: She exhibits no deformity.  Moves x 4 extremities.requires  assist with wheelchair  Lymphadenopathy:    She has no cervical adenopathy.  Neurological: She is alert.  Confused at baseline.   Skin: Skin is warm and dry. No rash noted. No erythema. No pallor.  Psychiatric: She has a normal mood and affect.    Labs reviewed:  Recent Labs  02/11/16 0457 02/12/16 0443 02/13/16 0328 02/21/16  NA 140 139 140 138  K 3.3* 4.5 4.3 4.2  CL 103 109 106  --   CO2 26 21* 24  --   GLUCOSE 84 122* 90  --   BUN 13 17 19 14   CREATININE 1.00 1.11* 1.04* 0.9  CALCIUM 9.0 8.8* 9.0  --   MG 1.3*  --   --   --     Recent Labs  09/12/15 1019 01/01/16 1026 02/10/16 0348  AST 17 58* 22  ALT 13 36 16  ALKPHOS 63 56 52  BILITOT 0.5 0.9 0.2*  PROT 6.5 6.7 5.5*  ALBUMIN 3.6 3.2* 2.3*    Recent Labs  01/01/16 1026  02/09/16 1311 02/10/16 0348 02/11/16 0457 02/21/16  WBC 11.5*  < > 7.4 7.2 6.0 6.6  NEUTROABS 8.4*  --  4.8  --   --  4  HGB 12.2  < > 10.4* 10.5* 11.1* 13.2  HCT 36.2  < > 30.5* 31.3* 33.0* 42  MCV 93.3  < > 95.3 95.1 95.7  --   PLT 164  < > 142* 142* 137* 203  < > = values in this interval not displayed. Lab Results  Component Value Date   TSH 4.261 01/01/2016   Lab Results  Component Value Date   HGBA1C 5.0 02/10/2016   Lab Results  Component Value Date   CHOL 110 02/10/2016   HDL 53 02/10/2016   LDLCALC 43 02/10/2016   LDLDIRECT 36.0 03/22/2009   TRIG 70 02/10/2016   CHOLHDL 2.1 02/10/2016   Assessment/Plan 1. CHF Appears stable. Continue to monitor weight.   2. Hypothyroidism  Continue on Levothyroxine. Monitor TSH level.   3.Seizure disorder No recent seizure activity since prior visit. Continue on Depakote and Ativan. Safety precautions.continue to follow up with Hospice services.    Family/ staff Communication: Reviewed plan of care with facility Nurse supervisor.   Labs/tests ordered: None

## 2016-05-08 DIAGNOSIS — Z8673 Personal history of transient ischemic attack (TIA), and cerebral infarction without residual deficits: Secondary | ICD-10-CM | POA: Diagnosis not present

## 2016-05-08 DIAGNOSIS — I679 Cerebrovascular disease, unspecified: Secondary | ICD-10-CM | POA: Diagnosis not present

## 2016-05-08 DIAGNOSIS — I25810 Atherosclerosis of coronary artery bypass graft(s) without angina pectoris: Secondary | ICD-10-CM | POA: Diagnosis not present

## 2016-05-08 DIAGNOSIS — I509 Heart failure, unspecified: Secondary | ICD-10-CM | POA: Diagnosis not present

## 2016-05-08 DIAGNOSIS — F0150 Vascular dementia without behavioral disturbance: Secondary | ICD-10-CM | POA: Diagnosis not present

## 2016-05-08 DIAGNOSIS — I672 Cerebral atherosclerosis: Secondary | ICD-10-CM | POA: Diagnosis not present

## 2016-05-09 DIAGNOSIS — I672 Cerebral atherosclerosis: Secondary | ICD-10-CM | POA: Diagnosis not present

## 2016-05-09 DIAGNOSIS — I509 Heart failure, unspecified: Secondary | ICD-10-CM | POA: Diagnosis not present

## 2016-05-09 DIAGNOSIS — Z8673 Personal history of transient ischemic attack (TIA), and cerebral infarction without residual deficits: Secondary | ICD-10-CM | POA: Diagnosis not present

## 2016-05-09 DIAGNOSIS — F0150 Vascular dementia without behavioral disturbance: Secondary | ICD-10-CM | POA: Diagnosis not present

## 2016-05-09 DIAGNOSIS — I25810 Atherosclerosis of coronary artery bypass graft(s) without angina pectoris: Secondary | ICD-10-CM | POA: Diagnosis not present

## 2016-05-09 DIAGNOSIS — I679 Cerebrovascular disease, unspecified: Secondary | ICD-10-CM | POA: Diagnosis not present

## 2016-05-10 ENCOUNTER — Telehealth: Payer: Self-pay | Admitting: Neurology

## 2016-05-10 NOTE — Telephone Encounter (Signed)
Pt son [name] called in to say that his mother is in [facility], he was told for the last 2 weeks his mother has been in a steady decline and likely has about a month left.  He has decided to just bring her home and when [facility] of that he was told Hospice would have to list a Attending Physcian, he put Dr. Terrace ArabiaYan down as such.

## 2016-05-10 NOTE — Telephone Encounter (Signed)
Reviewed

## 2016-05-11 DIAGNOSIS — I509 Heart failure, unspecified: Secondary | ICD-10-CM | POA: Diagnosis not present

## 2016-05-11 DIAGNOSIS — F0150 Vascular dementia without behavioral disturbance: Secondary | ICD-10-CM | POA: Diagnosis not present

## 2016-05-11 DIAGNOSIS — I25810 Atherosclerosis of coronary artery bypass graft(s) without angina pectoris: Secondary | ICD-10-CM | POA: Diagnosis not present

## 2016-05-11 DIAGNOSIS — I679 Cerebrovascular disease, unspecified: Secondary | ICD-10-CM | POA: Diagnosis not present

## 2016-05-11 DIAGNOSIS — I672 Cerebral atherosclerosis: Secondary | ICD-10-CM | POA: Diagnosis not present

## 2016-05-11 DIAGNOSIS — Z8673 Personal history of transient ischemic attack (TIA), and cerebral infarction without residual deficits: Secondary | ICD-10-CM | POA: Diagnosis not present

## 2016-05-14 ENCOUNTER — Encounter: Payer: Self-pay | Admitting: Family

## 2016-05-14 ENCOUNTER — Non-Acute Institutional Stay (SKILLED_NURSING_FACILITY): Payer: Medicare Other | Admitting: Family

## 2016-05-14 DIAGNOSIS — F339 Major depressive disorder, recurrent, unspecified: Secondary | ICD-10-CM

## 2016-05-14 DIAGNOSIS — F0150 Vascular dementia without behavioral disturbance: Secondary | ICD-10-CM | POA: Diagnosis not present

## 2016-05-14 DIAGNOSIS — G4700 Insomnia, unspecified: Secondary | ICD-10-CM | POA: Diagnosis not present

## 2016-05-14 DIAGNOSIS — R5381 Other malaise: Secondary | ICD-10-CM | POA: Diagnosis not present

## 2016-05-14 DIAGNOSIS — I672 Cerebral atherosclerosis: Secondary | ICD-10-CM | POA: Diagnosis not present

## 2016-05-14 DIAGNOSIS — E039 Hypothyroidism, unspecified: Secondary | ICD-10-CM | POA: Diagnosis not present

## 2016-05-14 DIAGNOSIS — F0280 Dementia in other diseases classified elsewhere without behavioral disturbance: Secondary | ICD-10-CM

## 2016-05-14 DIAGNOSIS — G309 Alzheimer's disease, unspecified: Secondary | ICD-10-CM

## 2016-05-14 DIAGNOSIS — G40109 Localization-related (focal) (partial) symptomatic epilepsy and epileptic syndromes with simple partial seizures, not intractable, without status epilepticus: Secondary | ICD-10-CM

## 2016-05-14 DIAGNOSIS — I679 Cerebrovascular disease, unspecified: Secondary | ICD-10-CM | POA: Diagnosis not present

## 2016-05-14 DIAGNOSIS — I25810 Atherosclerosis of coronary artery bypass graft(s) without angina pectoris: Secondary | ICD-10-CM | POA: Diagnosis not present

## 2016-05-14 DIAGNOSIS — Z8673 Personal history of transient ischemic attack (TIA), and cerebral infarction without residual deficits: Secondary | ICD-10-CM | POA: Diagnosis not present

## 2016-05-14 DIAGNOSIS — I509 Heart failure, unspecified: Secondary | ICD-10-CM | POA: Diagnosis not present

## 2016-05-14 NOTE — Progress Notes (Signed)
Location:  [facility] Room Number: 1007 Place of Service:  SNF [phone])  Provider: Richarda Blade FNP-C   PCP: Shirline Frees, NP Patient Care Team: Shirline Frees, NP as PCP - General (Family Medicine)  Extended Emergency Contact Information Primary Emergency Contact: Dudash,Richard Address: [address]          [place] Home Phone: [phone] Relation: Son  Code Status: DNR Goals of care:  Advanced Directive information Advanced Directives 05/14/2016  Does Patient Have a Medical Advance Directive? Yes  Type of Advance Directive Out of facility DNR (pink MOST or yellow form)  Does patient want to make changes to medical advance directive? -  Copy of Healthcare Power of Attorney in Chart? -  Would patient like information on creating a medical advance directive? -  Pre-existing out of facility DNR order (yellow form or pink MOST form) Yellow form placed in chart (order not valid for inpatient use);Pink MOST form placed in chart (order not valid for inpatient use)     No Known Allergies  Chief Complaint  Patient presents with  . Discharge Note    discharge home with hospice     HPI:  83 y.o. female seen today at [facility] for discharge home. She was here for short term rehabilitation for post hospital re-admission from 02/09/2016-02/13/2016 with worsening weakness and right facial droop. She had acute encephalopathy. She was diagnosed with aspiration pneumonia and Escherichia coli UTI. She was started on antibiotics. She was seen by speech therapist in the hospital and started on dysphagia 1 diet with nectar thick liquids. Of note she was here for short term Rehabilitation post hospital admission from 01/01/2016-01/05/2016 with generalized weakness. She was diagnosed with urinary tract infection and started on antibiotics. She was also seen in the ER 04/21/2016 post fall at the facility  sustained left forehead laceration.her laceration was sutured in the ER.  She has worked with PT/OT with progressive decline in condition and advance dementia.She is currently on Hospice service.She will be discharged home with Hospice service for comfort care.She does not require any DME.Home health services will be arranged by facility social worker prior to discharge.She will discharge with her medication from the facility. Prescription medication will be written x 1 month then patient to follow up with PCP in 1-2 weeks. Facility staff report no new concerns.    Past Medical History:  Diagnosis Date  . CHF (congestive heart failure) (HCC)    Diastolic CHF. Echo (3/10) EF 45-50% with periapical akinesis. Mild LVH. Mild MR. Pseudonormal diastolic function. Echo (4/10): EF 65% with mild focal basal septal hypertrophy. Pseudonormal diastolic function. Normal wall motion. Moderate biatrial enlargement. PASP 61 mmHg. Echo (4/11): EF 55%, inferobasal hypokinesis, mild MR, PA systolic pressure 44 mmHg.  [name] Coronary artery disease    NSTEMI 3/10. LHC showed 80% mLAD, 90% mCFX. She had PCI with Endeavor 2.5 x 12 to LAD and Endeavor 3.0 x 12 to CFX  . Dementia   . Expressive aphasia syndrome    More likely atypical migraine than TIA. Carotid dopplers (8/09) with 40-60% LICA stenosis.  . Frequent falls   . Horner's syndrome    left  . Hypertension   . Hypothyroidism   . Lacunar stroke (HCC)    on CT  . SVT (supraventricular tachycardia) (HCC)    transient    History reviewed. No pertinent surgical history.    reports that she quit smoking about  66 years ago. Her smoking use included Cigarettes. She has never used smokeless tobacco. She reports that she does not drink alcohol or use drugs. Social History   Social History  . Marital status: Widowed    Spouse name: N/A  . Number of children: N/A  . Years of education: N/A   Occupational History  . Not on file.   Social History Main Topics  .  Smoking status: Former Smoker    Types: Cigarettes    Quit date: 05/25/1950  . Smokeless tobacco: Never Used  . Alcohol use No  . Drug use: No  . Sexual activity: Yes   Other Topics Concern  . Not on file   Social History Narrative  . No narrative on file   No Known Allergies  Pertinent  Health Maintenance Due  Topic Date Due  . DEXA SCAN  07/28/1987  . INFLUENZA VACCINE  Completed  . PNA vac Low Risk Adult  Completed    Medications: Allergies as of 05/14/2016   No Known Allergies     Medication List       Accurate as of 05/14/16  4:37 PM. Always use your most recent med list.          aspirin EC 81 MG tablet Take 81 mg by mouth daily.   divalproex 250 MG DR tablet Commonly known as:  DEPAKOTE Take 250 mg by mouth daily. Two tablets by mouth at bedtime.   levothyroxine 25 MCG tablet Commonly known as:  SYNTHROID, LEVOTHROID Take 1 tablet (25 mcg total) by mouth daily before breakfast.   LORazepam 0.5 MG tablet Commonly known as:  ATIVAN Take 0.5 tablets (0.25 mg total) by mouth daily as needed for anxiety.   Melatonin 3 MG Tabs Take 1 tablet by mouth at bedtime.   nitroGLYCERIN 0.4 MG SL tablet Commonly known as:  NITROSTAT Place 0.4 mg under the tongue every 5 (five) minutes as needed for chest pain (3 DOSES MAX).   sertraline 25 MG tablet Commonly known as:  ZOLOFT Take 25 mg by mouth daily.   TYLENOL 500 MG tablet Generic drug:  acetaminophen Take 500 mg by mouth 3 (three) times daily.   valsartan 40 MG tablet Commonly known as:  DIOVAN Take 1 tablet (40 mg total) by mouth 2 (two) times daily.       Review of Systems  Unable to perform ROS: Dementia    Vitals:   05/14/16 0908  BP: 134/78  Pulse: 83  Resp: 20  Temp: 97.5 F (36.4 C)  TempSrc: Oral  SpO2: 96%  Weight: 109 lb (49.4 kg)  Height: 4\' 10"  (1.473 m)   Body mass index is 22.78 kg/m. Physical Exam  Constitutional: She appears well-developed and well-nourished. No  distress.  confused at baseline  HENT:  Head: Normocephalic.  Mouth/Throat: Oropharynx is clear and moist. No oropharyngeal exudate.  Eyes: Conjunctivae and EOM are normal. Pupils are equal, round, and reactive to light. Right eye exhibits no discharge. Left eye exhibits no discharge. No scleral icterus.  Neck: Neck supple. No JVD present. No thyromegaly present.  Cardiovascular: Normal rate, regular rhythm, normal heart sounds and intact distal pulses.  Exam reveals no gallop and no friction rub.   No murmur heard. Pulmonary/Chest: Effort normal and breath sounds normal. No respiratory distress. She has no wheezes.  Abdominal: Soft. Bowel sounds are normal. She exhibits no distension. There is no tenderness. There is no rebound and no guarding.  Genitourinary:  Genitourinary Comments: Incontinent  Musculoskeletal: She exhibits no deformity.  Moves x 4 extremities. Wheelchair bound  Lymphadenopathy:    She has no cervical adenopathy.  Neurological: She is alert.  Confused at her baseline.   Skin: Skin is warm and dry. No rash noted. No erythema. No pallor.  Skin intact  Psychiatric: She has a normal mood and affect.    Labs reviewed: Basic Metabolic Panel:  Recent Labs  16/11/9610/16/17 0457 02/12/16 0443 02/13/16 0328 02/21/16  NA 140 139 140 138  K 3.3* 4.5 4.3 4.2  CL 103 109 106  --   CO2 26 21* 24  --   GLUCOSE 84 122* 90  --   BUN 13 17 19 14   CREATININE 1.00 1.11* 1.04* 0.9  CALCIUM 9.0 8.8* 9.0  --   MG 1.3*  --   --   --    Liver Function Tests:  Recent Labs  09/12/15 1019 01/01/16 1026 02/10/16 0348  AST 17 58* 22  ALT 13 36 16  ALKPHOS 63 56 52  BILITOT 0.5 0.9 0.2*  PROT 6.5 6.7 5.5*  ALBUMIN 3.6 3.2* 2.3*   CBC:  Recent Labs  01/01/16 1026  02/09/16 1311 02/10/16 0348 02/11/16 0457 02/21/16  WBC 11.5*  < > 7.4 7.2 6.0 6.6  NEUTROABS 8.4*  --  4.8  --   --  4  HGB 12.2  < > 10.4* 10.5* 11.1* 13.2  HCT 36.2  < > 30.5* 31.3* 33.0* 42  MCV 93.3  <  > 95.3 95.1 95.7  --   PLT 164  < > 142* 142* 137* 203  < > = values in this interval not displayed. Cardiac Enzymes:  Recent Labs  01/01/16 1026 01/01/16 1421 01/01/16 1959  CKTOTAL 658*  --   --   TROPONINI 0.15* 0.12* 0.10*     Recent Labs  09/12/15 1244  GLUCAP 111*   Assessment/Plan:    1. Physical deconditioning She has worked with PT/OT with progressive decline in condition and advance dementia. Will  discharge home with Hospice service for comfort care.No DME required.   2. Alzheimer's dementia without behavioral disturbance Progressive decline. Continue to assist with ADL'S. Skin care. Continue on Hospice service.   3. Hypothyroidism Continue on Levothyroxine.   4. Seizure disorder No recent seizure activity reported. Continue on divalproex.   5. Depression Stable. Continue on sertraline.   6. Insomnia Continue on Melatonin   Patient is being discharged with the following home health services:   -Hospice service for physical deconditioning and advanced dementia.    Patient is being discharged with the following durable medical equipment:    None required.   Patient has been advised to f/u with their PCP in 1-2 weeks to for a transitions of care visit.Social services at their facility was responsible for arranging this appointment.  Pt was provided with adequate prescriptions of noncontrolled medications to reach the scheduled appointment.For controlled substances, a limited supply was provided as appropriate for the individual patient. If the pt normally receives these medications from a pain clinic or has a contract with another physician, these medications should be received from that clinic or physician only).    Future labs/tests needed:  Discharge on hospice for comfort care.

## 2016-05-15 DIAGNOSIS — I679 Cerebrovascular disease, unspecified: Secondary | ICD-10-CM | POA: Diagnosis not present

## 2016-05-15 DIAGNOSIS — I672 Cerebral atherosclerosis: Secondary | ICD-10-CM | POA: Diagnosis not present

## 2016-05-15 DIAGNOSIS — I25810 Atherosclerosis of coronary artery bypass graft(s) without angina pectoris: Secondary | ICD-10-CM | POA: Diagnosis not present

## 2016-05-15 DIAGNOSIS — F0150 Vascular dementia without behavioral disturbance: Secondary | ICD-10-CM | POA: Diagnosis not present

## 2016-05-15 DIAGNOSIS — I509 Heart failure, unspecified: Secondary | ICD-10-CM | POA: Diagnosis not present

## 2016-05-15 DIAGNOSIS — Z8673 Personal history of transient ischemic attack (TIA), and cerebral infarction without residual deficits: Secondary | ICD-10-CM | POA: Diagnosis not present

## 2016-05-15 NOTE — Telephone Encounter (Signed)
Spoke to Gerlene Burdockichard - her PCP will be the attending physician.  [facility] [place] [facility] make the [facility] for the patient.

## 2016-05-15 NOTE — Telephone Encounter (Signed)
[name] @ [facility] called to advise that pt is actually going home today and wants Dr Terrace ArabiaYan as her attending Physician, [name] is asking for a call back to know if Dr Terrace ArabiaYan will agree to be so.  She said when calling just ask for her, only 2 in the office.

## 2016-05-16 DIAGNOSIS — I25810 Atherosclerosis of coronary artery bypass graft(s) without angina pectoris: Secondary | ICD-10-CM | POA: Diagnosis not present

## 2016-05-16 DIAGNOSIS — I679 Cerebrovascular disease, unspecified: Secondary | ICD-10-CM | POA: Diagnosis not present

## 2016-05-16 DIAGNOSIS — F0150 Vascular dementia without behavioral disturbance: Secondary | ICD-10-CM | POA: Diagnosis not present

## 2016-05-16 DIAGNOSIS — Z8673 Personal history of transient ischemic attack (TIA), and cerebral infarction without residual deficits: Secondary | ICD-10-CM | POA: Diagnosis not present

## 2016-05-16 DIAGNOSIS — I509 Heart failure, unspecified: Secondary | ICD-10-CM | POA: Diagnosis not present

## 2016-05-16 DIAGNOSIS — I672 Cerebral atherosclerosis: Secondary | ICD-10-CM | POA: Diagnosis not present

## 2016-05-18 DIAGNOSIS — I25810 Atherosclerosis of coronary artery bypass graft(s) without angina pectoris: Secondary | ICD-10-CM | POA: Diagnosis not present

## 2016-05-18 DIAGNOSIS — I509 Heart failure, unspecified: Secondary | ICD-10-CM | POA: Diagnosis not present

## 2016-05-18 DIAGNOSIS — I672 Cerebral atherosclerosis: Secondary | ICD-10-CM | POA: Diagnosis not present

## 2016-05-18 DIAGNOSIS — I679 Cerebrovascular disease, unspecified: Secondary | ICD-10-CM | POA: Diagnosis not present

## 2016-05-18 DIAGNOSIS — Z8673 Personal history of transient ischemic attack (TIA), and cerebral infarction without residual deficits: Secondary | ICD-10-CM | POA: Diagnosis not present

## 2016-05-18 DIAGNOSIS — F0150 Vascular dementia without behavioral disturbance: Secondary | ICD-10-CM | POA: Diagnosis not present

## 2016-05-21 DIAGNOSIS — I672 Cerebral atherosclerosis: Secondary | ICD-10-CM | POA: Diagnosis not present

## 2016-05-21 DIAGNOSIS — I509 Heart failure, unspecified: Secondary | ICD-10-CM | POA: Diagnosis not present

## 2016-05-21 DIAGNOSIS — Z8673 Personal history of transient ischemic attack (TIA), and cerebral infarction without residual deficits: Secondary | ICD-10-CM | POA: Diagnosis not present

## 2016-05-21 DIAGNOSIS — F0150 Vascular dementia without behavioral disturbance: Secondary | ICD-10-CM | POA: Diagnosis not present

## 2016-05-21 DIAGNOSIS — I679 Cerebrovascular disease, unspecified: Secondary | ICD-10-CM | POA: Diagnosis not present

## 2016-05-21 DIAGNOSIS — I25810 Atherosclerosis of coronary artery bypass graft(s) without angina pectoris: Secondary | ICD-10-CM | POA: Diagnosis not present

## 2016-05-24 DIAGNOSIS — I25810 Atherosclerosis of coronary artery bypass graft(s) without angina pectoris: Secondary | ICD-10-CM | POA: Diagnosis not present

## 2016-05-24 DIAGNOSIS — I509 Heart failure, unspecified: Secondary | ICD-10-CM | POA: Diagnosis not present

## 2016-05-24 DIAGNOSIS — F0150 Vascular dementia without behavioral disturbance: Secondary | ICD-10-CM | POA: Diagnosis not present

## 2016-05-24 DIAGNOSIS — I679 Cerebrovascular disease, unspecified: Secondary | ICD-10-CM | POA: Diagnosis not present

## 2016-05-24 DIAGNOSIS — I672 Cerebral atherosclerosis: Secondary | ICD-10-CM | POA: Diagnosis not present

## 2016-05-24 DIAGNOSIS — Z8673 Personal history of transient ischemic attack (TIA), and cerebral infarction without residual deficits: Secondary | ICD-10-CM | POA: Diagnosis not present

## 2016-05-27 DIAGNOSIS — N390 Urinary tract infection, site not specified: Secondary | ICD-10-CM | POA: Diagnosis not present

## 2016-05-27 DIAGNOSIS — Z8673 Personal history of transient ischemic attack (TIA), and cerebral infarction without residual deficits: Secondary | ICD-10-CM | POA: Diagnosis not present

## 2016-05-27 DIAGNOSIS — F339 Major depressive disorder, recurrent, unspecified: Secondary | ICD-10-CM | POA: Diagnosis not present

## 2016-05-27 DIAGNOSIS — F0150 Vascular dementia without behavioral disturbance: Secondary | ICD-10-CM | POA: Diagnosis not present

## 2016-05-27 DIAGNOSIS — I471 Supraventricular tachycardia: Secondary | ICD-10-CM | POA: Diagnosis not present

## 2016-05-27 DIAGNOSIS — R1310 Dysphagia, unspecified: Secondary | ICD-10-CM | POA: Diagnosis not present

## 2016-05-27 DIAGNOSIS — I672 Cerebral atherosclerosis: Secondary | ICD-10-CM | POA: Diagnosis not present

## 2016-05-27 DIAGNOSIS — E039 Hypothyroidism, unspecified: Secondary | ICD-10-CM | POA: Diagnosis not present

## 2016-05-27 DIAGNOSIS — R54 Age-related physical debility: Secondary | ICD-10-CM | POA: Diagnosis not present

## 2016-05-27 DIAGNOSIS — I509 Heart failure, unspecified: Secondary | ICD-10-CM | POA: Diagnosis not present

## 2016-05-27 DIAGNOSIS — R4701 Aphasia: Secondary | ICD-10-CM | POA: Diagnosis not present

## 2016-05-27 DIAGNOSIS — R569 Unspecified convulsions: Secondary | ICD-10-CM | POA: Diagnosis not present

## 2016-05-27 DIAGNOSIS — I25810 Atherosclerosis of coronary artery bypass graft(s) without angina pectoris: Secondary | ICD-10-CM | POA: Diagnosis not present

## 2016-05-27 DIAGNOSIS — I679 Cerebrovascular disease, unspecified: Secondary | ICD-10-CM | POA: Diagnosis not present

## 2016-05-27 DIAGNOSIS — I10 Essential (primary) hypertension: Secondary | ICD-10-CM | POA: Diagnosis not present

## 2016-05-28 ENCOUNTER — Telehealth: Payer: Self-pay | Admitting: Neurology

## 2016-05-28 ENCOUNTER — Telehealth: Payer: Self-pay | Admitting: Adult Health

## 2016-05-28 NOTE — Telephone Encounter (Signed)
Patient's son called to advise the patient passed away yesterday 06/09/16.

## 2016-05-28 NOTE — Telephone Encounter (Signed)
Pt son rick  is calling to let cory know his mom passed away yesterday and if cory would like to know the funeral details to come him

## 2016-06-26 DEATH — deceased

## 2016-11-16 ENCOUNTER — Encounter: Payer: Self-pay | Admitting: Adult Health

## 2016-12-17 ENCOUNTER — Other Ambulatory Visit: Payer: Self-pay | Admitting: *Deleted

## 2016-12-17 DIAGNOSIS — I6523 Occlusion and stenosis of bilateral carotid arteries: Secondary | ICD-10-CM

## 2018-02-10 IMAGING — CT CT CERVICAL SPINE W/O CM
4 of 7 series · 14 of 33 positions shown, 15 images · non-contrast
Comparison: Head CT 08/02/2015

CLINICAL DATA: Fall with laceration to back of head. No loss of
consciousness.

EXAM:
CT HEAD WITHOUT CONTRAST
CT CERVICAL SPINE WITHOUT CONTRAST
TECHNIQUE: Multidetector CT imaging of the head and cervical spine was
performed following the standard protocol without intravenous
contrast. Multiplanar CT image reconstructions of the cervical spine
were also generated.

[Series 4: head bone · axial · 0.41mm/px · z∈[-119,-57]mm · 3 of 79 slices shown]
[im 16/79  bone]
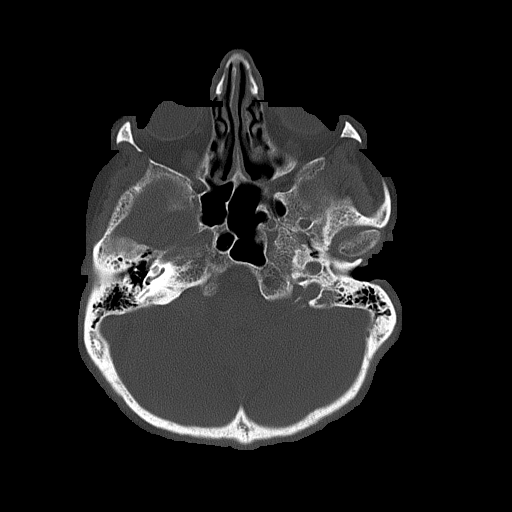
[im 32/79  bone]
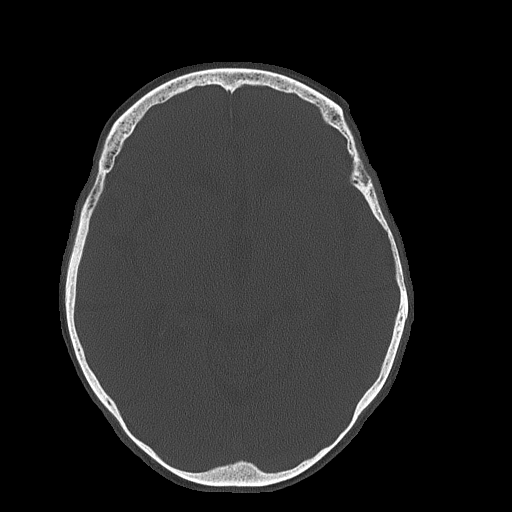
[im 47/79  bone]
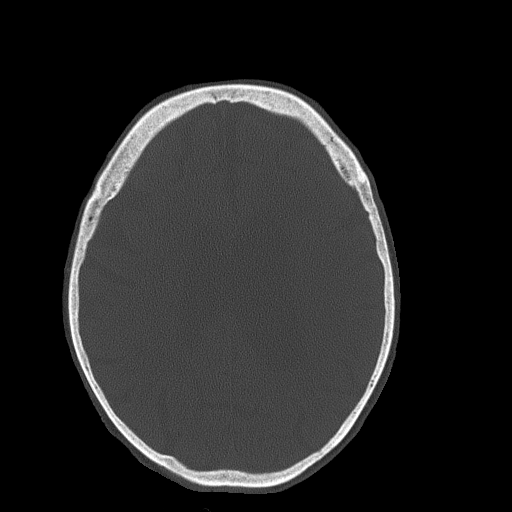

[Series 5: head without cor · coronal · non-contrast · 0.30mm/px · 2 of 67 slices shown]
[im 23/67  bone]
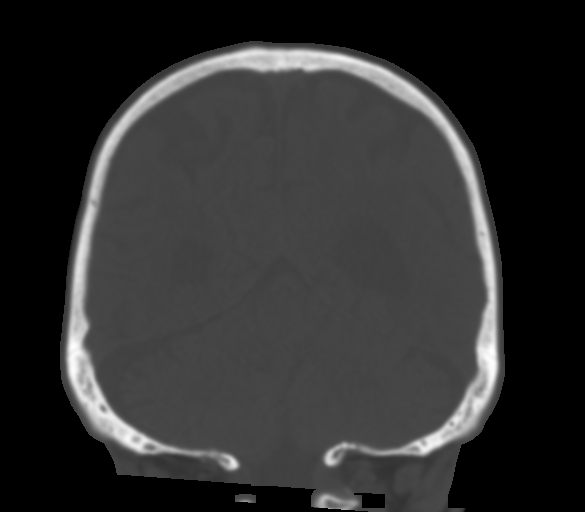
[im 45/67  bone]
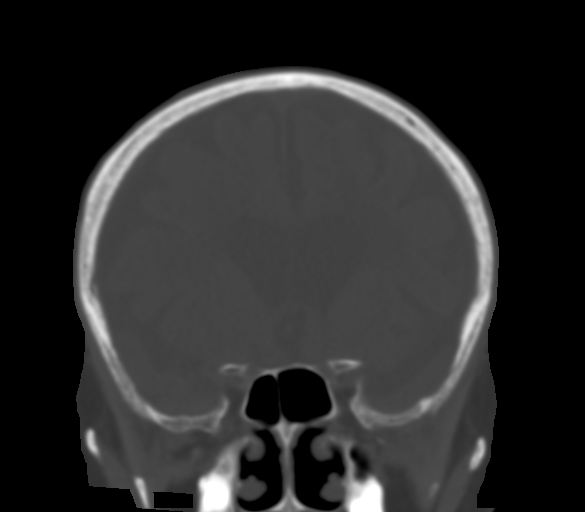

[Series 7: c_spine 2.0 st · axial · 0.41mm/px · z∈[-253,-155]mm · 4 of 80 slices shown, 5 images]
[im 16/80  soft-tissue]
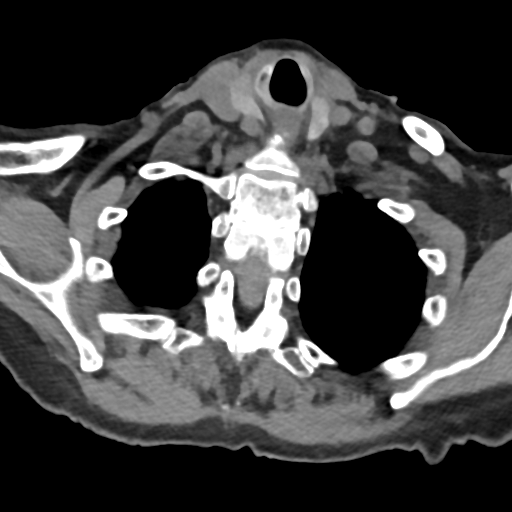
[im 16/80  bone]
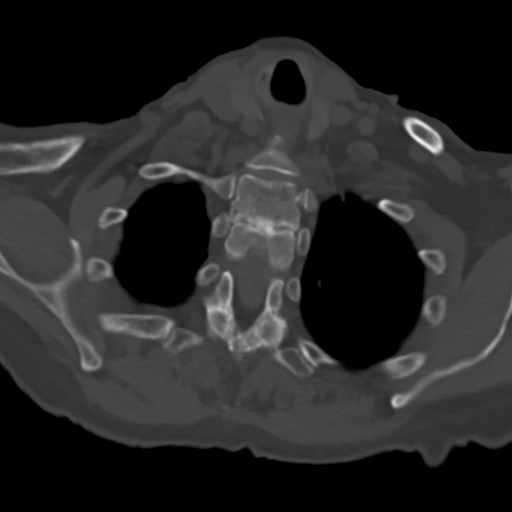
[im 32/80  bone]
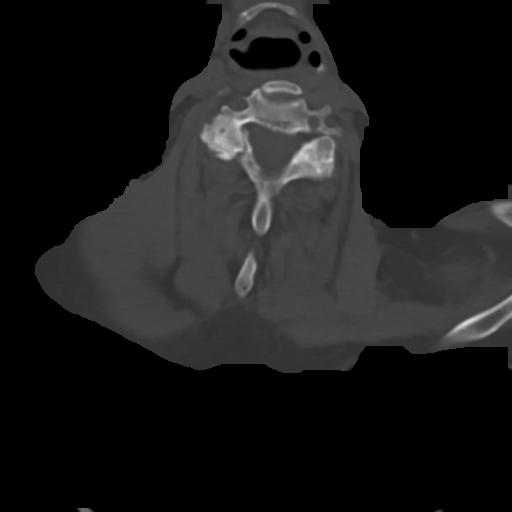
[im 48/80  bone]
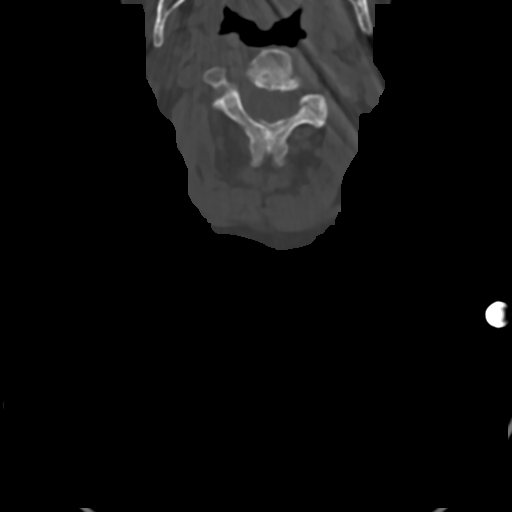
[im 64/80  bone]
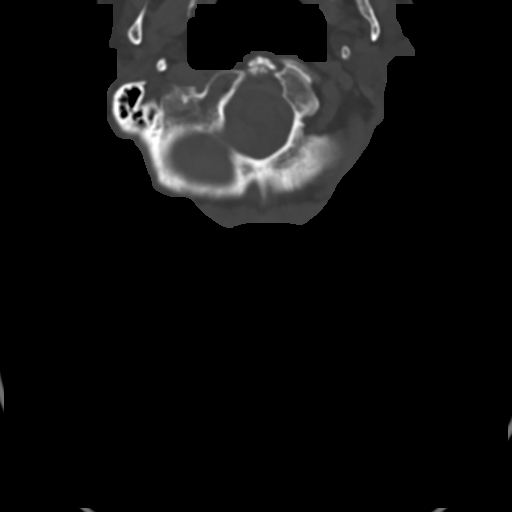

[Series 9: c_spine 2.0 sag bone · sagittal · 0.28mm/px · 5 of 61 slices shown]
[im 11/61  bone]
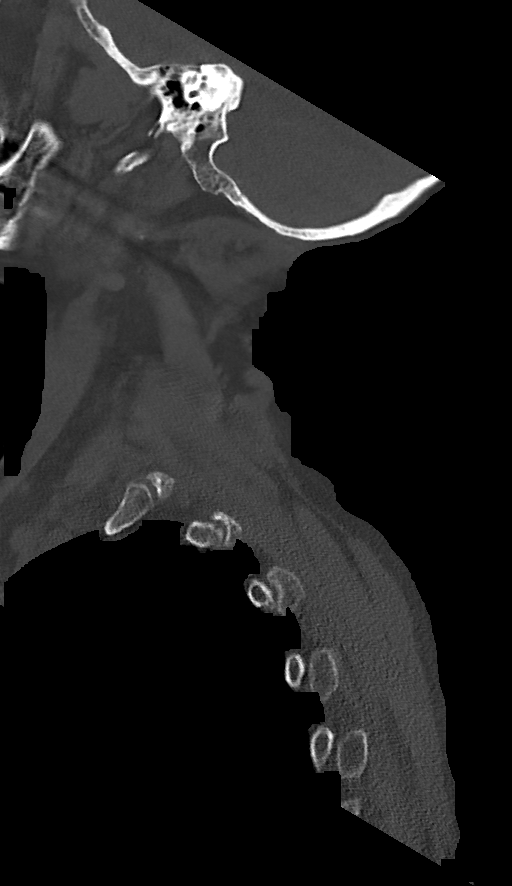
[im 21/61  bone]
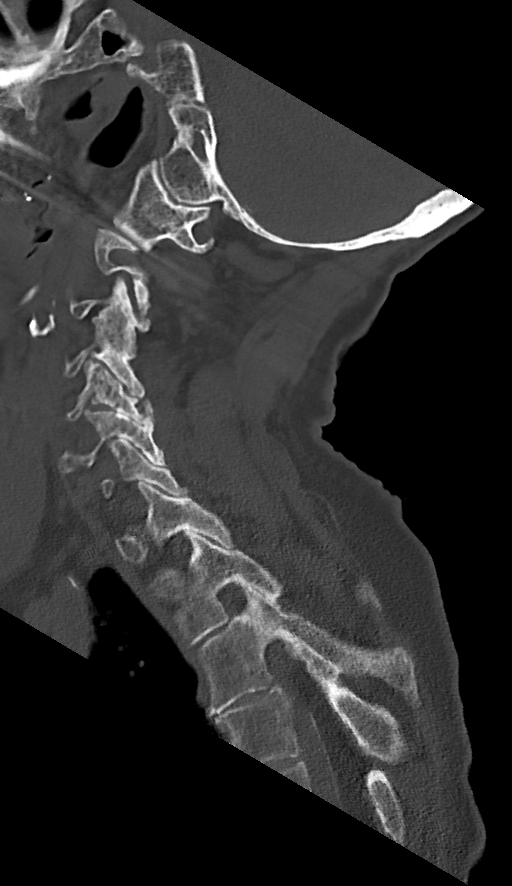
[im 31/61  bone]
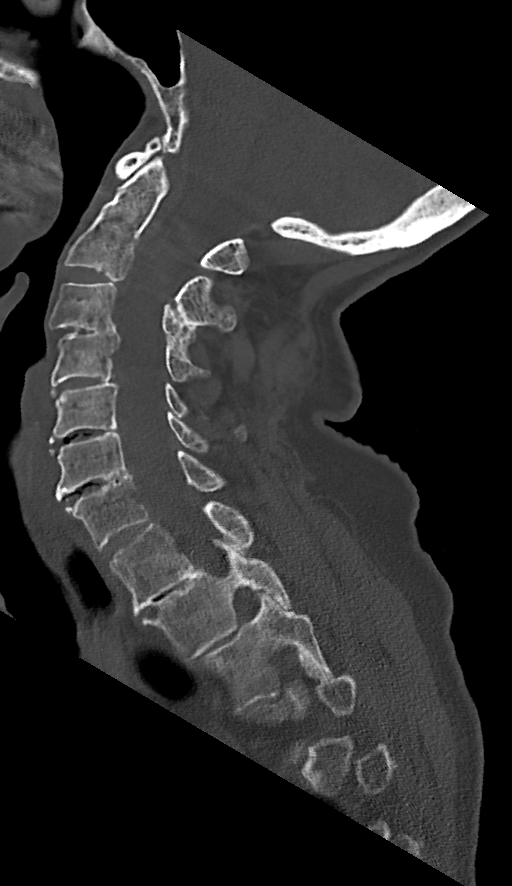
[im 41/61  bone]
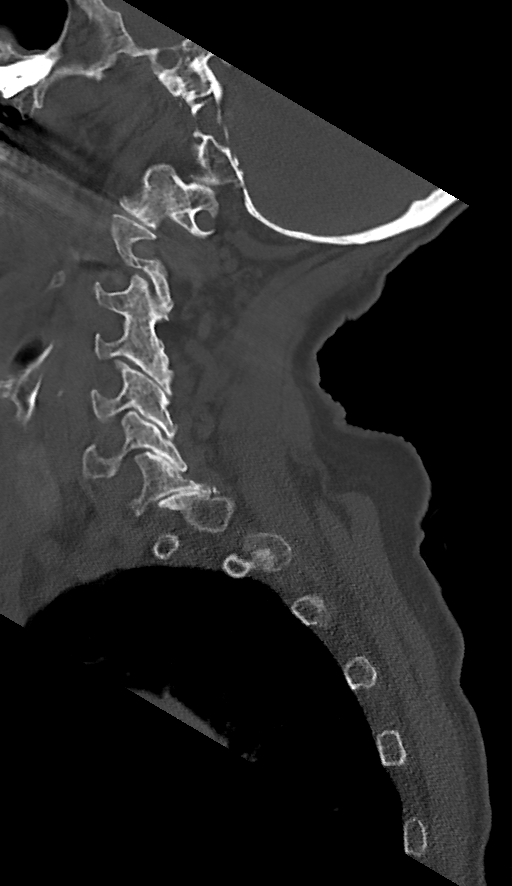
[im 51/61  bone]
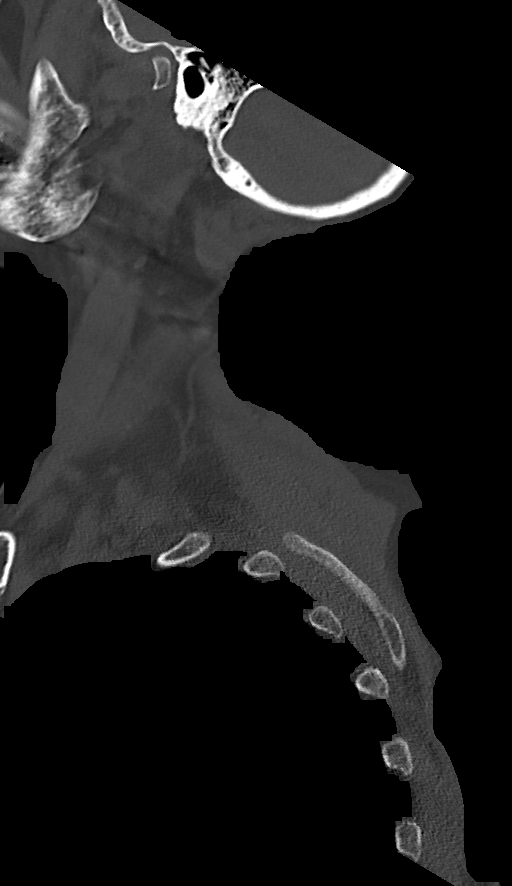

[14 of 33 positions shown; findings below may reference images not displayed]

FINDINGS: CT HEAD FINDINGS

Posterior midline scalp laceration with small hematoma. No fracture.
No intracranial hemorrhage, mass effect, or midline shift. Stable
atrophy and chronic small vessel ischemia. No subdural or
extra-axial fluid collection. No evidence of acute ischemia.
Paranasal sinuses and mastoid air cells are well-aerated.

CT CERVICAL SPINE FINDINGS

No acute fracture or subluxation. The dens is intact. Exaggerated
cervical lordosis and mild broad-based leftward curvature. There is
multilevel degenerative change throughout the cervical spine. Disc
space narrowing throughout with endplate spurring and multilevel
vacuum phenomenon. Minimal anterolisthesis of C3 on C4 appears
degenerative. There is bony fusion of C3 and C4 that may be
congenital or degenerative. Multilevel facet arthropathy.
Atherosclerosis of carotid vasculature. No prevertebral soft tissue
edema.
IMPRESSION: 1. Posterior scalp laceration and hematoma. No fracture or acute
intracranial abnormality. Stable atrophy and chronic small vessel
ischemia.
2. No acute fracture or subluxation in the cervical spine.
Multilevel degenerative change.
